# Patient Record
Sex: Male | Born: 1950
Health system: Southern US, Community
[De-identification: ages and names within clinical notes are randomized; demographics above are authoritative.]

## PROBLEM LIST (undated history)

## (undated) DIAGNOSIS — D126 Benign neoplasm of colon, unspecified: Secondary | ICD-10-CM

## (undated) DIAGNOSIS — J329 Chronic sinusitis, unspecified: Secondary | ICD-10-CM

## (undated) DIAGNOSIS — Z9889 Other specified postprocedural states: Secondary | ICD-10-CM

## (undated) DIAGNOSIS — T7840XA Allergy, unspecified, initial encounter: Secondary | ICD-10-CM

## (undated) DIAGNOSIS — M109 Gout, unspecified: Secondary | ICD-10-CM

## (undated) DIAGNOSIS — M130 Polyarthritis, unspecified: Secondary | ICD-10-CM

## (undated) DIAGNOSIS — A159 Respiratory tuberculosis unspecified: Secondary | ICD-10-CM

## (undated) DIAGNOSIS — R112 Nausea with vomiting, unspecified: Secondary | ICD-10-CM

## (undated) DIAGNOSIS — K760 Fatty (change of) liver, not elsewhere classified: Secondary | ICD-10-CM

## (undated) DIAGNOSIS — Z5189 Encounter for other specified aftercare: Secondary | ICD-10-CM

## (undated) DIAGNOSIS — I10 Essential (primary) hypertension: Secondary | ICD-10-CM

## (undated) DIAGNOSIS — K219 Gastro-esophageal reflux disease without esophagitis: Secondary | ICD-10-CM

## (undated) DIAGNOSIS — K449 Diaphragmatic hernia without obstruction or gangrene: Secondary | ICD-10-CM

## (undated) DIAGNOSIS — K648 Other hemorrhoids: Secondary | ICD-10-CM

## (undated) DIAGNOSIS — M1990 Unspecified osteoarthritis, unspecified site: Secondary | ICD-10-CM

## (undated) DIAGNOSIS — R197 Diarrhea, unspecified: Secondary | ICD-10-CM

## (undated) DIAGNOSIS — K621 Rectal polyp: Secondary | ICD-10-CM

## (undated) DIAGNOSIS — K602 Anal fissure, unspecified: Secondary | ICD-10-CM

## (undated) HISTORY — DX: Encounter for other specified aftercare: Z51.89

## (undated) HISTORY — DX: Rectal polyp: K62.1

## (undated) HISTORY — DX: Essential (primary) hypertension: I10

## (undated) HISTORY — DX: Benign neoplasm of colon, unspecified: D12.6

## (undated) HISTORY — DX: Gout, unspecified: M10.9

## (undated) HISTORY — DX: Other specified postprocedural states: Z98.890

## (undated) HISTORY — DX: Diaphragmatic hernia without obstruction or gangrene: K44.9

## (undated) HISTORY — PX: SHOULDER SURGERY: SHX246

## (undated) HISTORY — DX: Diarrhea, unspecified: R19.7

## (undated) HISTORY — PX: COLONOSCOPY: SHX174

## (undated) HISTORY — DX: Polyarthritis, unspecified: M13.0

## (undated) HISTORY — DX: Unspecified osteoarthritis, unspecified site: M19.90

## (undated) HISTORY — DX: Respiratory tuberculosis unspecified: A15.9

## (undated) HISTORY — DX: Allergy, unspecified, initial encounter: T78.40XA

## (undated) HISTORY — DX: Other specified postprocedural states: R11.2

## (undated) HISTORY — DX: Gastro-esophageal reflux disease without esophagitis: K21.9

## (undated) HISTORY — DX: Fatty (change of) liver, not elsewhere classified: K76.0

## (undated) HISTORY — PX: KNEE SURGERY: SHX244

## (undated) HISTORY — PX: POLYPECTOMY: SHX149

## (undated) HISTORY — DX: Anal fissure, unspecified: K60.2

## (undated) HISTORY — PX: CHOLECYSTECTOMY: SHX55

## (undated) HISTORY — DX: Other hemorrhoids: K64.8

## (undated) HISTORY — DX: Chronic sinusitis, unspecified: J32.9

## (undated) HISTORY — PX: TOTAL KNEE ARTHROPLASTY: SHX125

---

## 2002-05-06 ENCOUNTER — Other Ambulatory Visit: Admission: RE | Admit: 2002-05-06 | Discharge: 2002-05-06 | Payer: Self-pay | Admitting: Obstetrics and Gynecology

## 2003-08-23 ENCOUNTER — Encounter: Admission: RE | Admit: 2003-08-23 | Discharge: 2003-08-23 | Payer: Self-pay | Admitting: Family Medicine

## 2004-05-28 ENCOUNTER — Inpatient Hospital Stay (HOSPITAL_COMMUNITY): Admission: RE | Admit: 2004-05-28 | Discharge: 2004-05-30 | Payer: Self-pay | Admitting: Orthopedic Surgery

## 2004-07-25 ENCOUNTER — Ambulatory Visit (HOSPITAL_BASED_OUTPATIENT_CLINIC_OR_DEPARTMENT_OTHER): Admission: RE | Admit: 2004-07-25 | Discharge: 2004-07-25 | Payer: Self-pay | Admitting: Orthopedic Surgery

## 2005-06-17 ENCOUNTER — Ambulatory Visit (HOSPITAL_COMMUNITY): Admission: RE | Admit: 2005-06-17 | Discharge: 2005-06-17 | Payer: Self-pay | Admitting: Orthopedic Surgery

## 2005-08-06 ENCOUNTER — Encounter: Admission: RE | Admit: 2005-08-06 | Discharge: 2005-08-06 | Payer: Self-pay | Admitting: Family Medicine

## 2008-02-17 ENCOUNTER — Ambulatory Visit: Payer: Self-pay | Admitting: Gastroenterology

## 2008-02-26 ENCOUNTER — Ambulatory Visit: Payer: Self-pay | Admitting: Gastroenterology

## 2009-05-22 ENCOUNTER — Inpatient Hospital Stay (HOSPITAL_COMMUNITY): Admission: RE | Admit: 2009-05-22 | Discharge: 2009-05-24 | Payer: Self-pay | Admitting: Orthopedic Surgery

## 2009-12-07 ENCOUNTER — Ambulatory Visit (HOSPITAL_COMMUNITY): Admission: RE | Admit: 2009-12-07 | Discharge: 2009-12-07 | Payer: Self-pay | Admitting: Rheumatology

## 2010-10-17 LAB — BASIC METABOLIC PANEL
BUN: 11 mg/dL (ref 6–23)
BUN: 13 mg/dL (ref 6–23)
CO2: 28 mEq/L (ref 19–32)
CO2: 29 mEq/L (ref 19–32)
Calcium: 8.4 mg/dL (ref 8.4–10.5)
Calcium: 8.4 mg/dL (ref 8.4–10.5)
Chloride: 102 mEq/L (ref 96–112)
Chloride: 99 mEq/L (ref 96–112)
Creatinine, Ser: 0.85 mg/dL (ref 0.4–1.5)
Creatinine, Ser: 0.88 mg/dL (ref 0.4–1.5)
GFR calc Af Amer: >60 mL/min (ref >60)
GFR calc Af Amer: >60 mL/min (ref >60)
GFR calc non Af Amer: >60 mL/min (ref >60)
GFR calc non Af Amer: >60 mL/min (ref >60)
Glucose, Bld: 115 mg/dL — ABNORMAL HIGH (ref 70–99)
Glucose, Bld: 128 mg/dL — ABNORMAL HIGH (ref 70–99)
Potassium: 4 mEq/L (ref 3.5–5.1)
Potassium: 4.3 mEq/L (ref 3.5–5.1)
Sodium: 135 mEq/L (ref 135–145)
Sodium: 136 mEq/L (ref 135–145)

## 2010-10-17 LAB — CROSSMATCH
ABO/RH(D): A NEG
Antibody Screen: POSITIVE
DAT, IgG: NEGATIVE
Donor AG Type: NEGATIVE
Donor AG Type: NEGATIVE
PT AG Type: NEGATIVE

## 2010-10-17 LAB — URINALYSIS, ROUTINE W REFLEX MICROSCOPIC
Bilirubin Urine: NEGATIVE
Bilirubin Urine: NEGATIVE
Glucose, UA: NEGATIVE mg/dL
Glucose, UA: NEGATIVE mg/dL
Hgb urine dipstick: NEGATIVE
Hgb urine dipstick: NEGATIVE
Ketones, ur: NEGATIVE mg/dL
Ketones, ur: NEGATIVE mg/dL
Nitrite: NEGATIVE
Nitrite: NEGATIVE
Protein, ur: NEGATIVE mg/dL
Protein, ur: NEGATIVE mg/dL
Specific Gravity, Urine: 1.02 (ref 1.005–1.030)
Specific Gravity, Urine: 1.021 (ref 1.005–1.030)
Urobilinogen, UA: 0.2 mg/dL (ref 0.0–1.0)
Urobilinogen, UA: 0.2 mg/dL (ref 0.0–1.0)
pH: 6 (ref 5.0–8.0)
pH: 6.5 (ref 5.0–8.0)

## 2010-10-17 LAB — CBC
HCT: 32.7 % — ABNORMAL LOW (ref 39.0–52.0)
HCT: 33.5 % — ABNORMAL LOW (ref 39.0–52.0)
HCT: 44.4 % (ref 39.0–52.0)
Hemoglobin: 11.5 g/dL — ABNORMAL LOW (ref 13.0–17.0)
Hemoglobin: 11.8 g/dL — ABNORMAL LOW (ref 13.0–17.0)
Hemoglobin: 15.6 g/dL (ref 13.0–17.0)
MCHC: 35.1 g/dL (ref 30.0–36.0)
MCHC: 35.1 g/dL (ref 30.0–36.0)
MCHC: 35.3 g/dL (ref 30.0–36.0)
MCV: 91.2 fL (ref 78.0–100.0)
MCV: 91.5 fL (ref 78.0–100.0)
MCV: 91.8 fL (ref 78.0–100.0)
Platelets: 176 10*3/uL (ref 150–400)
Platelets: 207 10*3/uL (ref 150–400)
Platelets: 251 10*3/uL (ref 150–400)
RBC: 3.58 MIL/uL — ABNORMAL LOW (ref 4.22–5.81)
RBC: 3.65 MIL/uL — ABNORMAL LOW (ref 4.22–5.81)
RBC: 4.87 MIL/uL (ref 4.22–5.81)
RDW: 13.5 % (ref 11.5–15.5)
RDW: 13.6 % (ref 11.5–15.5)
RDW: 13.8 % (ref 11.5–15.5)
WBC: 11.2 10*3/uL — ABNORMAL HIGH (ref 4.0–10.5)
WBC: 15.7 10*3/uL — ABNORMAL HIGH (ref 4.0–10.5)
WBC: 6.8 10*3/uL (ref 4.0–10.5)

## 2010-10-17 LAB — URINE CULTURE
Colony Count: NO GROWTH
Colony Count: NO GROWTH
Culture: NO GROWTH
Culture: NO GROWTH

## 2010-10-17 LAB — COMPREHENSIVE METABOLIC PANEL
ALT: 45 U/L (ref 0–53)
AST: 33 U/L (ref 0–37)
Albumin: 4.2 g/dL (ref 3.5–5.2)
Alkaline Phosphatase: 62 U/L (ref 39–117)
BUN: 13 mg/dL (ref 6–23)
CO2: 31 mEq/L (ref 19–32)
Calcium: 9.6 mg/dL (ref 8.4–10.5)
Chloride: 102 mEq/L (ref 96–112)
Creatinine, Ser: 0.86 mg/dL (ref 0.4–1.5)
GFR calc Af Amer: >60 mL/min (ref >60)
GFR calc non Af Amer: >60 mL/min (ref >60)
Glucose, Bld: 88 mg/dL (ref 70–99)
Potassium: 4.8 mEq/L (ref 3.5–5.1)
Sodium: 140 mEq/L (ref 135–145)
Total Bilirubin: 0.5 mg/dL (ref 0.3–1.2)
Total Protein: 7.7 g/dL (ref 6.0–8.3)

## 2010-10-17 LAB — TYPE AND SCREEN
ABO/RH(D): A NEG
Antibody Screen: POSITIVE

## 2010-10-17 LAB — DIFFERENTIAL
Basophils Absolute: 0 10*3/uL (ref 0.0–0.1)
Basophils Relative: 0 % (ref 0–1)
Eosinophils Absolute: 0.2 10*3/uL (ref 0.0–0.7)
Eosinophils Relative: 3 % (ref 0–5)
Lymphocytes Relative: 29 % (ref 12–46)
Lymphs Abs: 2 10*3/uL (ref 0.7–4.0)
Monocytes Absolute: 0.6 10*3/uL (ref 0.1–1.0)
Monocytes Relative: 9 % (ref 3–12)
Neutro Abs: 3.9 10*3/uL (ref 1.7–7.7)
Neutrophils Relative %: 58 % (ref 43–77)

## 2010-10-17 LAB — APTT: aPTT: 32 seconds (ref 24–37)

## 2010-10-17 LAB — PROTIME-INR
INR: 1.11 (ref 0.00–1.49)
Prothrombin Time: 14.2 seconds (ref 11.6–15.2)
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
## 2010-11-30 NOTE — Op Note (Signed)
NAMEJEMARCUS, DOUGAL               ACCOUNT NO.:  [id number]   MEDICAL RECORD NO.:  [id number]          PATIENT TYPE:  INP   LOCATION:  5004                         FACILITY:  [facility]   PHYSICIAN:  Estill Bamberg. Ronnie Derby, M.D. DATE OF BIRTH:  09/29/1950   DATE OF PROCEDURE:  05/28/2004  DATE OF DISCHARGE:                                 OPERATIVE REPORT   PREOPERATIVE DIAGNOSIS:  Left knee osteoarthritis.   POSTOPERATIVE DIAGNOSIS:  Left knee osteoarthritis.   OPERATION PERFORMED:  Left total knee arthroplasty.   SURGEON:  Estill Bamberg. Ronnie Derby, M.D.   ASSISTANT:  Thomasenia Bottoms, P.A.-C.   ANESTHESIA:  General.   INDICATIONS FOR PROCEDURE:  The patient is a 60 year old white male with  failure of conservative measures for osteoarthritis of the knee.  Informed  consent was obtained.   DESCRIPTION OF PROCEDURE:  The patient was laid supine and administered  general anesthesia.  The left lower extremity was prepped and draped in the  usual sterile fashion.  A #10 blade was used to make a midline incision 10  cm in length.  A new #10 blade was used to make a median parapatellar  arthrotomy and to elevate the deep medial collateral ligament off the medial  crest of the tibia.  I also performed a synovectomy in the patellofemoral  pouch.  I everted the patella and it measured 26 mm thickness.  I used a 32  mm reamer and reamed down to 16 mm.  I then used a template and actually  accepted a 38 mm patella.  At this point, I drilled the three lug holes in  and with the trial in place, it also measured 26 mm thick.  I removed the  trial component, left the knee cap everted and went into flexion.  I cut the  ACL and PCL and subluxed anteriorly.  I then used the extramedullary  alignment system to set up the tibial cut removing 2 mm of bone off of the  medial tibial plateau.  I made the cut with the sagittal saw and removed the  extramedullary device.  At this point I made an extramedullary drill hole  in  the femur and set the intramedullary guide set on 6 degree Valgus cut.  I  then placed the distal femoral cutting block in place, pinned it into place  and made the distal femoral cut with a sagittal saw.  I then removed the  cutting block.  I drew out the epicondylar axis, posterior condylar angle  measured 3 degrees.  I sized to a size F and pinned to the three degree  external rotation holes.  I pinned a 4 in 1cutting block into place and made  the anterior, posterior and chamfer cuts.  I removed the cutting block and  placed a 12 spacer block in the knee, had good flexion and extension gap  balance that was perfect with a 14.  I did some medial releasing at this  point to get a good flexion extension gap balance.  I then placed a scoop  retractor behind the distal femur and prepared  the femur with a size F  finishing block, cutting the notch and drilling lug holes.  I then prepared  the tibia with a size 6 tibial tray drill and keel.  At this point I trialed  with a size 6 tibia, size F femur, size 14 insert and a 38 mm patella, had  good flexion extension gap balance, good patellar tracking.  I removed the  trial components and copiously irrigated.  I then removed the trial  components and copiously irrigated. At this point I cemented in a size 6  tibial first, size F femur second, snapped in a real 14 polyethylene after  removing excess cement.  I then cemented in the 38 patella with the knee in  extension.  After the cement was hardened, I let the tourniquet down.  I  cauterized the bleeding vessels.  At this point I left the Hemovac deep to  the arthrotomy coming out superolaterally.  I then closed the arthrotomy  with interrupted #1 Vicryl suture, the deep soft tissues with interrupted 0  Vicryl sutures and a subcuticular 2-0 Vicryl stitch.  I then closed with  skin staples and dressed with Xeroform dressing sponges and sterile Webril  and Ace wrap.   COMPLICATIONS:   None.   DRAINS:  One Hemovac.   ESTIMATED BLOOD LOSS:  300 mL.       SDL/MEDQ  D:  05/28/2004  T:  05/29/2004  Job:  [id number]

## 2010-11-30 NOTE — Op Note (Signed)
NAME:  RYE, DECOSTE               ACCOUNT NO.:  [id number]   MEDICAL RECORD NO.:  [id number]          PATIENT TYPE:  AMB   LOCATION:                               FACILITY:  [facility]   PHYSICIAN:  Estill Bamberg. Ronnie Derby, M.D.      DATE OF BIRTH:   DATE OF PROCEDURE:  07/25/2004  DATE OF DISCHARGE:                                 OPERATIVE REPORT   PREOPERATIVE DIAGNOSIS:  Arthrofibrosis of the left knee, status post total  knee arthroplasty.   POSTOPERATIVE DIAGNOSIS:  Arthrofibrosis of the left knee, status post total  knee arthroplasty.   PROCEDURE:  Left knee manipulation.   INDICATIONS FOR PROCEDURE:  The patient is a 60 year old 8 weeks status post  TKA with some stiffness.  Informed consent was obtained.   DESCRIPTION OF PROCEDURE:  The patient was laid supine and administered  general mask anesthesia.  His leg was then manipulated in standard fashion  which went from 0 to 85 degrees up to 0 to 115 degrees.  At this point, an 8  mL Marcaine/2 mL Depo-Medrol mixture was injected and the patient was  awakened and taken to the recovery room in stable condition.                                               ______________________________  Estill Bamberg Ronnie Derby, M.D.    SDL/MEDQ  D:  08/06/2004  T:  08/06/2004  Job:  [id number]

## 2010-11-30 NOTE — Op Note (Signed)
NAME:  Christopher Smith, Christopher Smith               ACCOUNT NO.:  [id number]   MEDICAL RECORD NO.:  [id number]          PATIENT TYPE:  AMB   LOCATION:  SDS                          FACILITY:  [facility]   PHYSICIAN:  Kathalene Frames. Mayer Camel, M.D.   DATE OF BIRTH:  1951-06-12   DATE OF PROCEDURE:  DATE OF DISCHARGE:  06/17/2005                                 OPERATIVE REPORT   PREOPERATIVE DIAGNOSIS:  Left shoulder retracted rotator cuff tear.   POSTOPERATIVE DIAGNOSIS:  Left shoulder retracted rotator cuff tear.   PROCEDURE:  1.  Left shoulder arthroscopic anterior-inferior acromioplasty, co-planing      of the distal clavicle, and debridement of a fairly massive rotator cuff      tear.  2.  Greater tuberoplasty.   ANESTHETIC:  General endotracheal.   ESTIMATED BLOOD LOSS:  Minimal.   FLUID REPLACEMENT:  One liter of crystalloids.   DRAINS PLACED:  None.   TOURNIQUET TIME:  None.   INDICATIONS FOR PROCEDURE:  A 60 year old gentleman with an MRI proven, left  shoulder rotator cuff tear retracted greater than 3 cm with impingement-type  pain who desires elective arthroscopic evaluation and treatment with  possible repair if the tissue is amenable to a repair.  This procedure is  being done at the main hospital because of a history of sleep apnea that  made him a less than desirable candidate for [facility]. Risks  and benefits of surgery discussed in advance, and all questions answered.   DESCRIPTION OF PROCEDURE:  The patient was identified by armband, taken to  the operating room, at [facility]. Appropriate anesthetic monitors  were attached; and general endotracheal anesthesia induced with the patient  in the supine position.  He was then placed into the beach chair position  and the left upper extremity prepped and draped in the usual sterile fashion  from the wrist to the hemithorax. Using a #11 blade standard portals were  then made 1.5 cm anterior to the [facility] joint, lateral to  the junction of the  middle and posterior thirds of the acromion, and posterior to the  posterolateral corner of the acromion process. The arthroscope was placed in  the lateral portal, the inflow through the anterior portal, and a 4.2 great  white sucker shaver through the posterior portal.   Diagnostic arthroscopy did reveal a massive rotator cuff tear with friable  tissue that, after debridement back to stable margin, did not look like to  be repairable. A supplemental anterolateral portal was then made allowing  introduction of an arthroscopic grasper; and we attempted to reduce the edge  of the supraspinatus tendon back to its insertion point on the greater  tuberosity, but could not get it within 2 cm of the insertion point.  Therefore, it was felt that this was a massive unrepairable tear in a 60-  year-old gentleman.   At this point we set about debriding any loose tissue off the greater  tuberosity, smoothing the greater tuberosity with a 4.5 hooded vortex bur.  We then resected the anterior-inferior edge of the acromion with a  4.5  hooded vortex bur creating a type 1 acromion, and also removed a small spur  from the distal clavicle. At this point then, the shoulder was irrigated out  with normal saline solution. The arthroscopic instruments removed and a  dressing of Xeroform, 4 x 4 dressing, sponges, paper tape, and a sling  applied. The patient was then laid supine, awakened, and taken to the  recovery room without difficulty.      Kathalene Frames. Mayer Camel, M.D.  Electronically Signed     FJR/MEDQ  D:  07/01/2005  T:  07/02/2005  Job:  [id number]

## 2010-11-30 NOTE — Op Note (Signed)
NAME:  Christopher Smith, Christopher Smith               ACCOUNT NO.:  [id number]   MEDICAL RECORD NO.:  [id number]          PATIENT TYPE:  AMB   LOCATION:                               FACILITY:  [facility]   PHYSICIAN:  Estill Bamberg. Ronnie Derby, M.D. DATE OF BIRTH:  October 04, 1950   DATE OF PROCEDURE:  07/25/2004  DATE OF DISCHARGE:                                 OPERATIVE REPORT   PREOPERATIVE DIAGNOSES:  Arthrofibrosis of the left knee, status post below-  knee amputation.   POSTOPERATIVE DIAGNOSES:  Arthrofibrosis of the left knee, status post below-  knee amputation.   PROCEDURES:  Left knee manipulation.   INDICATIONS FOR PROCEDURE:  The patient is a 60 year old status post a BKA  the week prior, now with stiffness after some trauma to the knee. Informed  consent was obtained.   DESCRIPTION OF PROCEDURE:  The patient was placed under general anesthesia  via mask and the knee was manipulated in the standard fashion. We started  with 0-90 degrees and ended with 1-122 degrees. Depo-Medrol 2 mL and 8 mL of  lidocaine were placed in the knee.   DISPOSITION:  Afterwards, the patient was taken to the recovery room in  stable condition.   COMPLICATIONS:  None.   DRAINS:  None.   ESTIMATED BLOOD LOSS:  None.       ___________________________________________  Estill Bamberg. Ronnie Derby, M.D.    SDL/MEDQ  D:  08/13/2004  T:  08/13/2004  Job:  [id number]

## 2010-12-05 ENCOUNTER — Encounter: Payer: Self-pay | Admitting: Gastroenterology

## 2010-12-07 ENCOUNTER — Ambulatory Visit: Payer: Self-pay | Admitting: Gastroenterology

## 2011-01-11 ENCOUNTER — Ambulatory Visit (INDEPENDENT_AMBULATORY_CARE_PROVIDER_SITE_OTHER): Payer: 59 | Admitting: Gastroenterology

## 2011-01-11 ENCOUNTER — Encounter: Payer: Self-pay | Admitting: Gastroenterology

## 2011-01-11 ENCOUNTER — Other Ambulatory Visit (INDEPENDENT_AMBULATORY_CARE_PROVIDER_SITE_OTHER): Payer: 59

## 2011-01-11 DIAGNOSIS — R197 Diarrhea, unspecified: Secondary | ICD-10-CM

## 2011-01-11 DIAGNOSIS — Z713 Dietary counseling and surveillance: Secondary | ICD-10-CM

## 2011-01-11 DIAGNOSIS — K639 Disease of intestine, unspecified: Secondary | ICD-10-CM | POA: Insufficient documentation

## 2011-01-11 DIAGNOSIS — IMO0001 Reserved for inherently not codable concepts without codable children: Secondary | ICD-10-CM | POA: Insufficient documentation

## 2011-01-11 DIAGNOSIS — R195 Other fecal abnormalities: Secondary | ICD-10-CM

## 2011-01-11 LAB — HEPATIC FUNCTION PANEL: Total Bilirubin: 0.7 mg/dL (ref 0.3–1.2)

## 2011-01-11 LAB — IBC PANEL
Iron: 66 ug/dL (ref 42–165)
Saturation Ratios: 17.4 % — ABNORMAL LOW (ref 20.0–50.0)
Transferrin: 270.7 mg/dL (ref 212.0–360.0)

## 2011-01-11 LAB — BASIC METABOLIC PANEL
BUN: 15 mg/dL (ref 6–23)
Calcium: 9 mg/dL (ref 8.4–10.5)
Creatinine, Ser: 0.8 mg/dL (ref 0.4–1.5)
GFR: 109.21 mL/min (ref >60.00)
Glucose, Bld: 98 mg/dL (ref 70–99)
Potassium: 5 mEq/L (ref 3.5–5.1)

## 2011-01-11 LAB — CBC WITH DIFFERENTIAL/PLATELET
Basophils Relative: 0.3 % (ref 0.0–3.0)
Eosinophils Relative: 1.6 % (ref 0.0–5.0)
Hemoglobin: 15.2 g/dL (ref 13.0–17.0)
Lymphocytes Relative: 21.2 % (ref 12.0–46.0)
Monocytes Relative: 10.1 % (ref 3.0–12.0)
Neutro Abs: 5.6 10*3/uL (ref 1.4–7.7)
Neutrophils Relative %: 66.8 % (ref 43.0–77.0)
RBC: 4.94 Mil/uL (ref 4.22–5.81)
WBC: 8.4 10*3/uL (ref 4.5–10.5)

## 2011-01-11 LAB — FOLATE: Folate: 14.7 ng/mL (ref >5.9)

## 2011-01-11 LAB — VITAMIN B12: Vitamin B-12: 265 pg/mL (ref 211–911)

## 2011-01-11 MED ORDER — DIPHENOXYLATE-ATROPINE 2.5-0.025 MG PO TABS: 1.0000 | ORAL_TABLET | Freq: Four times a day (QID) | ORAL | Status: AC | PRN

## 2011-01-11 MED ORDER — MESALAMINE 1.2 G PO TBEC: DELAYED_RELEASE_TABLET | ORAL | Status: DC

## 2011-01-11 MED ORDER — PEG-KCL-NACL-NASULF-NA ASC-C 100 G PO SOLR: 1.0000 | Freq: Once | ORAL | Status: DC

## 2011-01-11 NOTE — Patient Instructions (Addendum)
Stop your sulfasalzine. Take Lialda two tablets by mouth twice a day, samples given and rx sent to your pharmacy. Please go to the basement today for your labs.  Your procedure has been scheduled for 01/24/2011, please follow the seperate instructions.  Your prescription(s) have been sent to you pharmacy.     Low-Fiber and Residue Restricted Diets A low-fiber diet restricts foods that contain carbohydrates that are not digested in the small intestine. A diet containing about 10 grams of fiber is considered low-fiber. The diet needs to be individualized to suit patient tolerances and preferences and to avoid unnecessary restrictions. Generally, the foods emphasized in a low-fiber diet have no skins or seeds. They may have been processed to remove bran, germ, or husks. Cooking may not necessarily eliminate the fiber. Cooking may, in fact, enable a greater quantity of fiber to be consumed in a lesser volume. Legumes and nuts are also restricted. The term low-residue has also been used to describe low-fiber diets, although the two are not the same. Residue refers to any substance that adds to bowel (colonic) contents, such as sloughed cells and intestinal germs (bacteria) in addition to fiber. Residue-containing foods, prunes and prune juice, milk, and connective tissue from meats may also need to be eliminated. It is important to eliminate these foods during sudden (acute) attacks of inflammatory bowel disease, when there is a partial obstruction due to another reason, or when minimal fecal output is desired. When problems are in remission, a more normal diet may be used. PURPOSE  Prevent blockage of a partially obstructed or narrowed gastrointestinal tract.   Reduce stool weight and volume.   Slow the movement of waste.  WHEN IS THIS DIET USED?  Acute phase of Crohn's disease, ulcerative colitis, regional enteritis, or diverticulitis.   Narrowing (stenosis) of intestinal or esophageal tubes  (lumina).   Transitional diet following surgery, injury (trauma), or illness.  ADEQUACY This diet is nutritionally adequate based on individual food choices according to the Recommended Dietary Allowances of the [employer]. SPECIAL NOTES In severe cases, it is recommended that residue containing foods, prunes and prune juice, milk, and connective tissue from meats be eliminated. Check labels, especially on foods from the starch list. Often, dietary fiber content is listed with the nutrition information. Since products are continually introduced or removed from the grocery shelf, this list may not be complete. FOOD GROUP ALLOWED/RECOMMENDED AVOID/USE SPARINGLY  STARCHES 6 servings or more daily    Breads White, [place], and pita breads, plain rolls, buns, or sweet rolls, doughnuts, waffles, pancakes, bagels. Plain muffins, sweet breads, biscuits, matzoth. Flour. Bread, rolls, or crackers made with whole-wheat, multigrains, rye, bran seeds, nuts, or coconut. Corn tortillas, table-shells.  Crackers Soda, saltine, or graham crackers. Pretzels, rusks, melba toast, zwieback. See above. Corn chips, tortilla chips.  Cereals Cooked cereals: cornmeal, farina, cream cereals. Dry cereals: refined corn, wheat, rice, and oat cereals (check label). Cereals containing whole-grains, multigrains, bran, coconut, nuts, or raisins. Cooked or dry oatmeal. Coarse wheat cereals, granola. Cereals advertised as "high fiber."  Potatoes/Pasta/Rice Potatoes prepared any way without skins, refined macaroni, spaghetti, noodles, refined rice. Potato skins. Whole-grain pasta, wild or brown rice. Popcorn.  VEGETABLES 2 to 3 servings or more daily Strained tomato and vegetable juices. Fresh: tender lettuce, cucumber, cabbage, spinach, bean sprouts. Cooked, canned: asparagus, bean sprouts, cut green or wax beans, cauliflower, pumpkin, beets, mushrooms, olives, spinach, yellow squash, tomato, tomato sauce (no seeds),  zucchini (peeled), turnips. Canned sweet potatoes. Small amounts  of celery, onion, radish, and green pepper may be used. Keep servings limited to 1/2 cup. Fresh, cooked, or canned: artichokes, baked beans, beet greens, broccoli, Brussels sprouts, French-style green beans, corn, kale, legumes, peas, sweet potatoes. Cooked: green or red cabbage, spinach. Avoid large servings of any vegetables.  FRUIT 2 to 3 servings or more daily All fruit juices except prune juice. Cooked or canned: apricots applesauce, cantaloupe, cherries, grapefruit, grapes, kiwi, mandarin oranges, peaches, pears, fruit cocktail, pineapple, plums, watermelon. Fresh: banana, grapes, cantaloupe, avocado, cherries, pineapple, grapefruit, kiwi, nectarines, peaches, oranges, blueberries, plums. Keep servings limited to 1/2 cup or 1 piece. Fresh: apple with or without skin, apricots, mango, pears, raspberries, strawberries. Prune juice, stewed or dried prunes. Dried fruits, raisins, dates. Avoid large servings of all fresh fruits.  MEAT AND MEAT SUBSTITUTES 2 servings or more (4 to 6 total daily) Ground or well-cooked tender beef, ham, veal, lamb, pork, or poultry. Eggs, plain cheese. Fish, oysters, shrimp, lobster, other seafood. Liver, organ meats. Tough, fibrous meats with gristle. Peanut butter, smooth or chunky. Cheese with seeds, nuts, or other foods not allowed. Nuts, seeds, legumes, dried peas, beans, lentils.  MILK 2 cups or equivalent daily All milk products except those not allowed. Milk and milk product consumption should be minimal when low residue is desired. Yogurt that contains nuts or seeds.  SOUPS AND COMBINATION FOODS Bouillon, broth, or cream soups made from allowed foods. Any strained soup. Casseroles or mixed dishes made with allowed foods. Soups made from vegetables that are not allowed or that contain other foods not allowed.  DESSERTS AND SWEETS In moderation Plain cakes and cookies, pie made with allowed fruit,  pudding, custard, cream pie. Gelatin, fruit, ice, sherbet, frozen ice pops. Ice cream, ice milk without nuts. Plain hard candy, honey, jelly, molasses, syrup, sugar, chocolate syrup, gumdrops, marshmallows. Desserts, cookies, or candies that contain nuts, peanut butter, or dried fruits. Jams, preserves with seeds, marmalade.  FATS AND OILS In moderation Margarine, butter, cream, mayonnaise, salad oils, plain salad dressings made from allowed foods. Plain gravy, crisp bacon without rind. Seeds, nuts, olives. Avocados.  BEVERAGES All, except those listed to avoid. Fruit juices with high pulp, prune juice.  CONDIMENTS/ MISCELLANEOUS Ketchup, mustard, horseradish, vinegar, cream sauce, cheese sauce, cocoa powder. Spices in moderation: allspice, basil, bay leaves, celery powder or leaves, cinnamon, cumin powder, curry powder, ginger, mace, marjoram, onion or garlic powder, oregano, paprika, parsley flakes, ground pepper, rosemary, sage, savory, tarragon, thyme, turmeric. Coconut, pickles.  A serving is equal to: 1/2 cup for fruits, vegetables, and cooked cereals or 1 piece for foods such as a piece of bread, 1 orange, or 1 apple. For dry cereals and crackers, use serving sizes listed on the label. SAMPLE MEAL PLAN The following menu is provided as a sample. Your daily menu plans will vary. Be sure to include a minimum of the following each day in order to provide essential nutrients for the adult: Starch/Bread/Cereal Group Fruit/Vegetable Group Meat/Meat Substitute Group Milk/Milk Substitute Group 6 servings 5 servings 2 servings 2 servings  Combination foods may count as full or partial servings from various food groups. Fats, desserts, and sweets may be added to the meal plan after the requirements for essential nutrients are met. SAMPLE MENU Breakfast Lunch Supper  1/2 cup orange juice 1/2 cup chicken noodle soup 3 ounces baked chicken  1 boiled egg 2 to 3 ounces sliced roast beef 1/2 cup  scalloped potatoes  1 slice white toast 2 slices seedless rye  bread 1/2 cup cooked beets  Margarine Mayonnaise White dinner roll  3/4 cup cornflakes 1/2 cup tomato juice Margarine  1 cup milk 1 small banana 1/2 cup canned peaches  Beverage Beverage Beverage  Document Released: 12/21/2001 Document Re-Released: 09/25/2009 [facility] Patient Information 2011 [place].

## 2011-01-11 NOTE — Progress Notes (Signed)
History of Present Illness:  This is a very pleasant 60 year old Caucasian male who has completed a 8 month course of INH  because of a positive PPD which was placed per anticipation of immunosuppressive therapy because of a diffuse inflammatory arthropathy. 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 Because of his arthropathy, he was placed on Azulfidine, 1 g a day by mouth several months ago. He presents with 6 months of 10-12 bowel movements a day without abdominal cramping, gas, bloating, but he does have occasional bright red blood per rectum. He has lost 18-20 pounds in weight, and his symptoms also suggest possible malabsorption. There is no history of prior pancreatitis, hepatitis, or alcohol abuse. He quit smoking 25 years ago. He also has has severe nocturnal diarrhea, but denies fever, chills, or skin rashes. His arthritis is mostly in his hands and large joints, and he relates he has had multiple lab tests done all of which have been unremarkable, he has had rheumatology referral. His diarrhea has not been responsive to Questran trial for a history of previous cholecystectomy, Imodium,or dietary restrictions. Family history is noncontributory. He had a negative colonoscopy except for diverticulosis in our office several years ago. Patient has had bilateral knee replacements by Dr. Maureen Ralphs.  I have reviewed this patient's present history, medical and surgical past history, allergies and medications.     ROS: The remainder of the 10 point ROS is negative... he has mild fatigue but is able to work full time at his job. He denies foreign travel or known infectious disease exposure. Does not abuse NSAIDs or alcohol or cigarettes. He does complain of crampy pain in his left upper abdominal area and his legs and extremities during the day and night. Unknown history of electrolyte imbalance. However, most of his records are not available for  review.  Past Medical History  Diagnosis Date  . Arthritis   . Tuberculosis     Hx of   . GERD (gastroesophageal reflux disease)   . Diarrhea    Past Surgical History  Procedure Date  . Cholecystectomy   . Knee surgery     Bilateral   . Shoulder surgery     Bilateral     reports that he has quit smoking. He has never used smokeless tobacco. He reports that he drinks alcohol. He reports that he does not use illicit drugs. family history is negative for Colon cancer. No Known Allergies      Physical Exam: General well developed well nourished patient in no acute distress, appearing their stated age Eyes PERRLA, no icterus, fundoscopic exam per opthamologist Skin no lesions noted Neck supple, no adenopathy, no thyroid enlargement, no tenderness Chest clear to percussion and auscultation Heart no significant murmurs, gallops or rubs noted Abdomen no hepatosplenomegaly masses or tenderness, BS normal.  Rectal inspection normal no fissures, or fistulae noted.  No masses or tenderness on digital exam. Stool guaiac positive. The stool is loose and blood-tinged.. Extremities no acute joint lesions, edema, phlebitis or evidence of cellulitis. Lateral knee scar is noted. There is trace to +1 edema in his lower tremors. Neurologic patient oriented x 3, cranial nerves intact, no focal neurologic deficits noted. Psychological mental status normal and normal affect.  Assessment and plan: Bloody diarrhea of a rather severe nature suggestive of ulcerative colitis. Other considerations be C. difficile colitis although INH is not a common cause of this condition. I suspect is inflammatory arthritis is associated with his  inflammatory bowel disease. He does not take his Azulfidine at required doses. I changed him to Lialda 2.4 g twice today, when necessary Lomotil prn every 6-8 hours, low fiber diet, and scheduled followup labs includeding a sed rate and CRP, and multiple stool exams including C.  difficile toxin, culture, O&P exam, and fat smear. We will try to locate his rheumatologist for better record review. Followup colonoscopy also scheduled ASAP.   Please copy her primary care physician, referring physician, and pertinent subspecialists. We'll request records from his primary care physician and from his rheumatologist. Encounter Diagnoses  Name Primary?  . Heme positive stool   . Diarrhea

## 2011-01-14 ENCOUNTER — Other Ambulatory Visit: Payer: 59

## 2011-01-14 DIAGNOSIS — R197 Diarrhea, unspecified: Secondary | ICD-10-CM

## 2011-01-14 DIAGNOSIS — R195 Other fecal abnormalities: Secondary | ICD-10-CM

## 2011-01-15 LAB — FECAL FAT QUALITATIVE: NEUTRAL FAT: NORMAL

## 2011-01-16 LAB — CLOSTRIDIUM DIFFICILE BY PCR: Toxigenic C. Difficile by PCR: NOT DETECTED

## 2011-01-18 LAB — STOOL CULTURE

## 2011-01-24 ENCOUNTER — Ambulatory Visit (AMBULATORY_SURGERY_CENTER): Payer: 59 | Admitting: Gastroenterology

## 2011-01-24 ENCOUNTER — Encounter: Payer: Self-pay | Admitting: Gastroenterology

## 2011-01-24 DIAGNOSIS — K5190 Ulcerative colitis, unspecified, without complications: Secondary | ICD-10-CM

## 2011-01-24 DIAGNOSIS — K51911 Ulcerative colitis, unspecified with rectal bleeding: Secondary | ICD-10-CM

## 2011-01-24 DIAGNOSIS — R197 Diarrhea, unspecified: Secondary | ICD-10-CM

## 2011-01-24 DIAGNOSIS — R195 Other fecal abnormalities: Secondary | ICD-10-CM

## 2011-01-24 MED ORDER — SODIUM CHLORIDE 0.9 % IV SOLN: 500.0000 mL | INTRAVENOUS | Status: DC

## 2011-01-24 NOTE — Patient Instructions (Signed)
Please read the handouts given to you by your recovery room nurse.   You do have colitis and need to eat a low fiber diet until you see Dr. Sharlett Iles again on August 8th.   Please, take all of your routine medications today.  Do not skip a dose of your lialda.  This is very important to your recovery.   Call us if you have any questions or concerns at [phone].  Thank-you.

## 2011-01-25 ENCOUNTER — Telehealth: Payer: Self-pay | Admitting: *Deleted

## 2011-01-25 NOTE — Telephone Encounter (Signed)

## 2011-01-29 ENCOUNTER — Encounter: Payer: Self-pay | Admitting: Gastroenterology

## 2011-01-29 ENCOUNTER — Other Ambulatory Visit: Payer: Self-pay | Admitting: Gastroenterology

## 2011-02-11 ENCOUNTER — Encounter: Payer: Self-pay | Admitting: *Deleted

## 2011-02-15 ENCOUNTER — Encounter: Payer: Self-pay | Admitting: Gastroenterology

## 2011-02-15 ENCOUNTER — Other Ambulatory Visit (INDEPENDENT_AMBULATORY_CARE_PROVIDER_SITE_OTHER): Payer: 59

## 2011-02-15 ENCOUNTER — Ambulatory Visit (INDEPENDENT_AMBULATORY_CARE_PROVIDER_SITE_OTHER): Payer: 59 | Admitting: Gastroenterology

## 2011-02-15 DIAGNOSIS — K5190 Ulcerative colitis, unspecified, without complications: Secondary | ICD-10-CM

## 2011-02-15 LAB — HIGH SENSITIVITY CRP: CRP, High Sensitivity: 11.3 mg/L — ABNORMAL HIGH (ref 0.000–5.000)

## 2011-02-15 MED ORDER — MESALAMINE 4 G RE ENEM: 4.0000 g | ENEMA | Freq: Every day | RECTAL | Status: DC

## 2011-02-15 MED ORDER — PREDNISONE 10 MG PO TABS: ORAL_TABLET | ORAL | Status: DC

## 2011-02-15 NOTE — Patient Instructions (Signed)
Please go to the basement today for your labs.  Your prescription(s) have been sent to you pharmacy.  Use Lomotil only as needed. Use Rowasa supp at night, and take Prednisone 70m per day.  Make an appt to come back and see Dr PSharlett Ilesin 2 weeks and the day prior come to the basement for a repeat CRP.

## 2011-02-15 NOTE — Progress Notes (Signed)
This is a very pleasant 60 year old Caucasian male with severe diarrhea refractory currently to oral mucosal is slight therapy 4.8 g daily. He also is using Lomotil 3-4 times a day and continues with 6-7 loose bowel movements a day with occasional pain. He denies abdominal pain or other gastrointestinal or systemic complaints. Recent colonoscopy confirmed what appeared to be ulcerative colitis confirmed by biopsies. Stool exams otherwise been unremarkable. He has had an elevated CRP. Testing for C. difficile was also negative.  Current Medications, Allergies, Past Medical History, Past Surgical History, Family History and Social History were reviewed in [employer] record.  Pertinent Review of Systems Negative   Assessment and plan: Moderately severe inflammatory bowel disease unresponsive to oral amiodarone salicylates after 6 weeks of therapy. I have added Rowasa 4 g enemas at bedtime and will start prednisone 40 mg a day with office followup in 2 weeks' time. CRP ordered today and in 2 weeks' time. I have asked him to decrease his Lomotil to when necessary usage of possible and to liberalize his diet as tolerated.     Encounter Diagnosis  Name Primary?  . Ulcerative colitis Yes

## 2011-02-28 ENCOUNTER — Other Ambulatory Visit (INDEPENDENT_AMBULATORY_CARE_PROVIDER_SITE_OTHER): Payer: 59

## 2011-02-28 DIAGNOSIS — K5190 Ulcerative colitis, unspecified, without complications: Secondary | ICD-10-CM

## 2011-02-28 LAB — HIGH SENSITIVITY CRP: CRP, High Sensitivity: 7.71 mg/L — ABNORMAL HIGH (ref 0.000–5.000)

## 2011-03-01 ENCOUNTER — Encounter: Payer: Self-pay | Admitting: Gastroenterology

## 2011-03-01 ENCOUNTER — Other Ambulatory Visit (INDEPENDENT_AMBULATORY_CARE_PROVIDER_SITE_OTHER): Payer: 59

## 2011-03-01 ENCOUNTER — Ambulatory Visit (INDEPENDENT_AMBULATORY_CARE_PROVIDER_SITE_OTHER): Payer: 59 | Admitting: Gastroenterology

## 2011-03-01 VITALS — BP 110/80 | HR 78 | Ht 71.5 in | Wt 210.0 lb

## 2011-03-01 DIAGNOSIS — R252 Cramp and spasm: Secondary | ICD-10-CM

## 2011-03-01 DIAGNOSIS — K5190 Ulcerative colitis, unspecified, without complications: Secondary | ICD-10-CM | POA: Insufficient documentation

## 2011-03-01 DIAGNOSIS — R197 Diarrhea, unspecified: Secondary | ICD-10-CM

## 2011-03-01 LAB — ELECTROLYTE PANEL: Sodium: 139 mEq/L (ref 135–145)

## 2011-03-01 MED ORDER — MESALAMINE 1000 MG RE SUPP: 1000.0000 mg | Freq: Every day | RECTAL | Status: DC

## 2011-03-01 NOTE — Patient Instructions (Signed)
Stop the Rowasa. Start Canasa at night, rx sent to your pharmacy. You can take Imoddium AD twice a day as needed.  Make a return office visit for 2 weeks and come the day before for a repeat CRP. Please go to the basement today for your labs.

## 2011-03-01 NOTE — Progress Notes (Signed)
This is a 60 year old Caucasian male with left-sided ulcerative colitis refractory to standard aminosalicylate therapy. We recently added Rowasa enemas which he could not tolerate. I started prednisone 40 mg a day 2 weeks ago and he feels much better symptomatically but continues with frequent small volume nonbloody diarrhea. However, he is working daily and denies systemic complaints. Appetite is good and his weight is stable. Continues on Lialda 2.4 g twice a day.  Current Medications, Allergies, Past Medical History, Past Surgical History, Family History and Social History were reviewed in [employer] record.  Pertinent Review of Systems Negative.Marland Kitchen specifically no cough, sputum production, cardiovascular symptoms, fever, chills, skin rashes, joint pains, oral stomatitis. Review of systems 10 point negative. He does complain of leg spasms at night but no claudications.Marland Kitchen there is no history of any specific food intolerances, upper GI or hepatobiliary problems.   Physical Exam: Healthy appearing in no acute distress appearing his stated age. I cannot appreciate stigmata of chronic liver disease. His chest is clear to percussion and auscultation. His irregular rhythm without murmurs gallops or rubs. There is no organomegaly, abdominal masses or tenderness. Bowel sounds are normal. Peripheral extremities are unremarkable mental status is normal. Rectal exam is deferred.    Assessment and Plan: Left-sided ulcerative colitis beginning to respond to high-dose corticosteroid therapy. I will try and also 1 g suppositories at bedtime in place of Rowasa enemas. CRP has decreased from 15-6, and I suspect his symptomatology will begin to improve shortly. I have advised him to use Imodium AD twice a day regularly as tolerated. He is to continue prednisone 40 mg a day with office followup in 2 weeks' time, repeat CRP exam before his office visit. Also today because of his complaints of leg  cramps we will order electrolytes profile. Encounter Diagnosis  Name Primary?  . UC (ulcerative colitis) Yes

## 2011-03-11 ENCOUNTER — Other Ambulatory Visit: Payer: Self-pay | Admitting: Gastroenterology

## 2011-03-21 ENCOUNTER — Other Ambulatory Visit (INDEPENDENT_AMBULATORY_CARE_PROVIDER_SITE_OTHER): Payer: 59

## 2011-03-21 DIAGNOSIS — K5190 Ulcerative colitis, unspecified, without complications: Secondary | ICD-10-CM

## 2011-03-22 ENCOUNTER — Ambulatory Visit (INDEPENDENT_AMBULATORY_CARE_PROVIDER_SITE_OTHER)
Admission: RE | Admit: 2011-03-22 | Discharge: 2011-03-22 | Disposition: A | Discharge status: Home / Self Care | Payer: 59 | Source: Ambulatory Visit | Attending: Gastroenterology | Admitting: Gastroenterology

## 2011-03-22 ENCOUNTER — Encounter: Payer: Self-pay | Admitting: Gastroenterology

## 2011-03-22 ENCOUNTER — Ambulatory Visit (INDEPENDENT_AMBULATORY_CARE_PROVIDER_SITE_OTHER): Payer: 59 | Admitting: Gastroenterology

## 2011-03-22 DIAGNOSIS — R7611 Nonspecific reaction to tuberculin skin test without active tuberculosis: Secondary | ICD-10-CM

## 2011-03-22 DIAGNOSIS — K639 Disease of intestine, unspecified: Secondary | ICD-10-CM

## 2011-03-22 DIAGNOSIS — K5190 Ulcerative colitis, unspecified, without complications: Secondary | ICD-10-CM

## 2011-03-22 DIAGNOSIS — G4701 Insomnia due to medical condition: Secondary | ICD-10-CM | POA: Insufficient documentation

## 2011-03-22 MED ORDER — ZOLPIDEM TARTRATE 10 MG PO TABS: 10.0000 mg | ORAL_TABLET | Freq: Every evening | ORAL | Status: DC | PRN

## 2011-03-22 NOTE — Patient Instructions (Addendum)
Your procedure has been scheduled for 04/03/2011, please follow the seperate instructions.  Please go to the basement radiology today for your chest xray. Take Prednisone two tablets by mouth twice a day. Use Imodium as needed. Make an office visit to come back and see Dr Sharlett Iles in one month. You have signed a records release for Dr Sharlett Iles to get your recent records from Dr Estanislado Pandy. Today you were given information on the Colitis foundation.  Buy Caltrate OTC and take one tablet by mouth twice a day.

## 2011-03-22 NOTE — Progress Notes (Signed)
This is a very nice 60 year old Caucasian male who has had previous polyarticular arthritis, bilateral knee replacements, and apparently resolved Azulfidine for several years by Dr. Matthew Saras. Rheumatology records have been requested for review. He also was treated for positive PPD with INH 300 mg a day and B6 25 mg a day from 01/31/2010 to 10/25/2010. Apparently he did not have active tuberculosis. The patient relates that his diarrhea and colitis symptoms began while he was taken INH. He appears now to have an inflammatory ulcerative colitis picture partially in remission all prednisone 40 mg a day, Lialda 2.4 g twice a day. Patient also takes Imodium twice a day. His diarrhea is controlled during the day but he does have frequent nonbloody diarrheal stools at bedtime. He has chronic insomnia also. His arthritic complaints are markedly improved, and he denies other psychiatric or general medical issues. His appetite is good and his weight is stable. His CRP is decreased from 15-8  Current Medications, Allergies, Past Medical History, Past Surgical History, Family History and Social History were reviewed in [employer] record.  Pertinent Review of Systems Negative... no cardiovascular, pulmonary, or psychiatric problems. 10 point review of systems otherwise negative.   Physical Exam: Awake alert no acute distress. I cannot appreciate stigmata of chronic liver disease. His chest is entirely clear to percussion and auscultation. He is in a regular rhythm without murmurs gallops or rubs. There is no organomegaly, abdominal masses or tenderness. Bowel sounds are normal.  Extremities showed no edema, phlebitis, or swollen joints. Mental status is normal without gross focal neurological deficits.    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 I  have ordered repeat chest x-ray because of his history of a positive PPD with INH therapy. He has no current pulmonary problems. He is to continue his current medications but to switch his prednisone to 10 mg twice a day. He can use Imodium every 6-8 hours as needed. I also prescribed some when necessary Ambien 10 mg at bedtime. We will request records from rheumatology for review. Also he has been asked to take Caltrate 600 with vitamin D 2 a day. I have scheduled outpatient flexible sigmoidoscopy in 2 weeks' time. Information concerning the [facility] [place] also is provided. I will see him back in one month's time.

## 2011-03-27 DIAGNOSIS — K621 Rectal polyp: Secondary | ICD-10-CM

## 2011-03-27 HISTORY — DX: Rectal polyp: K62.1

## 2011-04-03 ENCOUNTER — Encounter: Payer: Self-pay | Admitting: Gastroenterology

## 2011-04-03 ENCOUNTER — Ambulatory Visit (AMBULATORY_SURGERY_CENTER): Payer: 59 | Admitting: Gastroenterology

## 2011-04-03 ENCOUNTER — Other Ambulatory Visit: Payer: Self-pay | Admitting: Gastroenterology

## 2011-04-03 VITALS — BP 128/77 | HR 54 | Temp 97.6°F | Resp 16 | Ht 71.0 in | Wt 210.0 lb

## 2011-04-03 DIAGNOSIS — D126 Benign neoplasm of colon, unspecified: Secondary | ICD-10-CM

## 2011-04-03 DIAGNOSIS — K5190 Ulcerative colitis, unspecified, without complications: Secondary | ICD-10-CM

## 2011-04-03 DIAGNOSIS — K5120 Ulcerative (chronic) proctitis without complications: Secondary | ICD-10-CM

## 2011-04-03 DIAGNOSIS — R195 Other fecal abnormalities: Secondary | ICD-10-CM

## 2011-04-03 DIAGNOSIS — R197 Diarrhea, unspecified: Secondary | ICD-10-CM

## 2011-04-03 MED ORDER — SODIUM CHLORIDE 0.9 % IV SOLN: 500.0000 mL | INTRAVENOUS | Status: DC

## 2011-04-03 NOTE — Progress Notes (Signed)
Pt to the restroom before discharge.  NO COMPLAINTS NOTED. MAW

## 2011-04-03 NOTE — Patient Instructions (Addendum)
See the picture page for your findings from your exam today.  Follow the green and blue discharge instruction sheets the rest of the day.  Resume your prior medications today.   Dr Sharlett Iles want to see you in the office the end of next week, appointment scheduled for Friday 04-12-11 at 08:45 on the 3rd floor.  Please call if any questions or concerns.

## 2011-04-04 ENCOUNTER — Telehealth: Payer: Self-pay | Admitting: *Deleted

## 2011-04-04 NOTE — Telephone Encounter (Signed)
No id on machine, no message left

## 2011-04-09 ENCOUNTER — Encounter: Payer: Self-pay | Admitting: Gastroenterology

## 2011-04-09 NOTE — Progress Notes (Signed)
Already has an appt and last c diff was July which was ok per Dr Sharlett Iles.

## 2011-04-12 ENCOUNTER — Ambulatory Visit (INDEPENDENT_AMBULATORY_CARE_PROVIDER_SITE_OTHER): Payer: 59 | Admitting: Gastroenterology

## 2011-04-12 ENCOUNTER — Other Ambulatory Visit: Payer: Self-pay | Admitting: Gastroenterology

## 2011-04-12 ENCOUNTER — Telehealth: Payer: Self-pay | Admitting: *Deleted

## 2011-04-12 ENCOUNTER — Encounter: Payer: Self-pay | Admitting: Gastroenterology

## 2011-04-12 VITALS — BP 118/72 | HR 60 | Ht 71.0 in | Wt 207.0 lb

## 2011-04-12 DIAGNOSIS — K639 Disease of intestine, unspecified: Secondary | ICD-10-CM

## 2011-04-12 DIAGNOSIS — K5190 Ulcerative colitis, unspecified, without complications: Secondary | ICD-10-CM

## 2011-04-12 MED ORDER — ADALIMUMAB 40 MG/0.8ML ~~LOC~~ KIT: PACK | SUBCUTANEOUS | Status: DC

## 2011-04-12 MED ORDER — ADALIMUMAB 40 MG/0.8ML ~~LOC~~ KIT: 40.0000 mg | PACK | SUBCUTANEOUS | Status: DC

## 2011-04-12 NOTE — Telephone Encounter (Signed)
Per Dr Loralyn Freshwater is not approved for UC, he has spoke with the rep. I have left a message for the patient to call back. I have also sent in rxs for Humira starter kit and biweekly doses. I have a starter kit for patient to pick up as well about the drug.

## 2011-04-12 NOTE — Progress Notes (Signed)
History of Present Illness: This is a extremely pleasant 60 year old Caucasian male with refractory ulcerative colitis confirmed a recent flexible sigmoidoscopy and biopsies. Despite being on prednisone 40 mg a day and Lialda 4.8 g a day, he continues with 5-6 loose bowel movements a day with mild cramping and very minimal bleeding. He has associated arthralgias and myalgias from the prednisone, and is on potassium replacements and calcium with vitamin D. The patient relates he really has had no response to any therapy to this point. Review of his labs shows a previously negative stool C. difficile toxin by PCR. He continues to not abuse of alcohol, cigarettes, or NSAIDs. As per my previous notes, he has a history of a previously positive PPD skin test, and was on 6 months of INH. Recent chest x-ray was entirely normal. The patient denies any cardiopulmonary symptomatology. At tbe time of his recent exam he had an inflammatory polyp excised. One of his main complaint is general malaise and fatigue. Has previously been evaluated by rheumatology. Associated with this prednisone use is worsening insomnia.   Current Medications, Allergies, Past Medical History, Past Surgical History, Family History and Social History were reviewed in [employer] record.   Assessment and plan: Refractory ulcerative colitis with recent CRP 8 which is not improved from baseline values. He currently is having side effects from corticosteroid therapy, and at this point it is my opinion that he needs to move to biological or before as IBD. We will start Humira subcutaneous injection therapy as per home healthcare. I've cut his prednisone to 20 mg a day,Lialda 2.4 g a day, will continue calcium with vitamin D, twice a day Imodium, and potassium chloride. I've consulted infectious disease and it appears reasonable to treat him with close attention to his pulmonary status and serial chest x-rays. Hopefully  We'll be  able to taper him off of prednisone rather quickly. Encounter Diagnosis  Name Primary?  . UC (ulcerative colitis) Yes

## 2011-04-12 NOTE — Patient Instructions (Signed)
Decrease Prednisone to 45m once a day in the morning. Decrease Lialda to 2 tablets by mouth once a day.  We will start the process for [place], and once we get a call back about the drug coverage we will contact you, if you have not heard anything in a week please call [phone] [facility]  You will need an office visit for follow up after the third injection.

## 2011-04-12 NOTE — Telephone Encounter (Signed)
Pharmacy states that 6 pens comes in the starter kit and the day 29 pen comes as pen one in the separate rx. I have sent all rxs. They will let me know if for some reason insurance wont cover the drug.

## 2011-04-16 ENCOUNTER — Telehealth: Payer: Self-pay | Admitting: Gastroenterology

## 2011-04-16 ENCOUNTER — Encounter: Payer: Self-pay | Admitting: *Deleted

## 2011-04-16 NOTE — Progress Notes (Signed)
  Subjective:    Patient ID: Christopher Smith, male    DOB: 1950-10-19, 60 y.o.   MRN: [id number]  HPI    Review of Systems     Objective:   Physical Exam        Assessment & Plan:  Received info from [employer], pt assistance for Cimzia, to complete info for assistance. Informed Kelli Hope at [phone], that Dr Sharlett Iles has ordered Humira for pt since Cimzia is off label .

## 2011-04-16 NOTE — Telephone Encounter (Signed)
Informed pt his insurance will not pay for Cimzia and Dr Sharlett Iles has ordered Humira for him, pt stated understanding and will come for teaching tomorrow am.

## 2011-04-19 ENCOUNTER — Telehealth: Payer: Self-pay | Admitting: *Deleted

## 2011-04-19 DIAGNOSIS — K5190 Ulcerative colitis, unspecified, without complications: Secondary | ICD-10-CM

## 2011-04-19 MED ORDER — ADALIMUMAB 40 MG/0.8ML ~~LOC~~ KIT: 40.0000 mg | PACK | SUBCUTANEOUS | Status: DC

## 2011-04-19 NOTE — Telephone Encounter (Signed)
Pt called to report he took his 1st dose of Humira- by the way it did burn going in. He has enough pens for the 2nd dose and that's it. Informed pt 2 scripts were sent for injections q 14 days. I will resend the order. Pt will notify us when he is due for the 3rd dose so we can schedule his f/u appt.

## 2011-04-23 ENCOUNTER — Ambulatory Visit: Payer: 59 | Admitting: Gastroenterology

## 2011-04-24 NOTE — Telephone Encounter (Signed)
I completed this under another note.

## 2011-05-09 ENCOUNTER — Telehealth: Payer: Self-pay | Admitting: Gastroenterology

## 2011-05-09 NOTE — Telephone Encounter (Signed)
Informed pt per Dr Hilarie Fredrickson, it's ok to take the flu shot while on Hurmira. He then stated he is still having diarrhea about as bad as before he started Humira. Pt stated he spoke with a [facility] nurse who told him it may take up to 3 months for him to see improvement. Pt stated he was better off when he just took Prednisone and we talked about his quality of life issues. He did apply for membership in the [employer], but it's been months and he hasn't heard from them. Scheduled pt for an appt with Dr Sharlett Iles after his 3rd injection so he may talk to him about it. Instructed pt to call me anytime he needs to vent. Called Humira , [phone], and spoke with a nurse who asked that pt call her rather than he initiating the call. She would like to register the pt for their Compliance Program so he can have regular f/u. lmom for pt to call back.

## 2011-05-10 NOTE — Telephone Encounter (Signed)
Late entry. I spoke with the pt yesterday afternoon and advised him of the RN program with Humira as well as the Compliance program; encouraged him to call. Pt will call us with future problems or questions.

## 2011-05-14 ENCOUNTER — Other Ambulatory Visit: Payer: Self-pay | Admitting: Gastroenterology

## 2011-05-16 ENCOUNTER — Other Ambulatory Visit: Payer: Self-pay | Admitting: Gastroenterology

## 2011-05-16 MED ORDER — ZOLPIDEM TARTRATE 10 MG PO TABS: 10.0000 mg | ORAL_TABLET | Freq: Every evening | ORAL | Status: DC | PRN

## 2011-05-17 ENCOUNTER — Telehealth: Payer: Self-pay | Admitting: Gastroenterology

## 2011-05-17 NOTE — Telephone Encounter (Signed)
lmom x 2 for pt to call back.

## 2011-05-17 NOTE — Telephone Encounter (Signed)
Spoke with pt who stated he got his nerve up at lunch and gave his shot; informed pt to call back at any time he needs to talk; pt stated understanding.

## 2011-05-20 ENCOUNTER — Encounter: Payer: Self-pay | Admitting: Gastroenterology

## 2011-05-21 ENCOUNTER — Ambulatory Visit (INDEPENDENT_AMBULATORY_CARE_PROVIDER_SITE_OTHER): Payer: 59 | Admitting: Gastroenterology

## 2011-05-21 ENCOUNTER — Encounter: Payer: Self-pay | Admitting: Gastroenterology

## 2011-05-21 VITALS — BP 104/72 | HR 60 | Ht 71.0 in | Wt 214.0 lb

## 2011-05-21 DIAGNOSIS — R197 Diarrhea, unspecified: Secondary | ICD-10-CM

## 2011-05-21 DIAGNOSIS — K529 Noninfective gastroenteritis and colitis, unspecified: Secondary | ICD-10-CM

## 2011-05-21 DIAGNOSIS — K5190 Ulcerative colitis, unspecified, without complications: Secondary | ICD-10-CM

## 2011-05-21 MED ORDER — ADALIMUMAB 40 MG/0.8ML ~~LOC~~ KIT: PACK | SUBCUTANEOUS | Status: DC

## 2011-05-21 MED ORDER — PREDNISONE 10 MG PO TABS: ORAL_TABLET | ORAL | Status: DC

## 2011-05-21 NOTE — Patient Instructions (Signed)
You are to taper off your prednisone.  We have sent a prescription for prednisone to your pharmacy. You are to take 34m for 2 weeks, 114m For 2 weeks, 39m439mor 2 weeks. Then discontinue taking the medication. Follow up with Dr. PatSharlett Ilester the Prednisone taper.  We have sent all prescriptions to your pharmacy. Continue [facility] CC: BriLaurena Spies

## 2011-05-21 NOTE — Progress Notes (Signed)
History of Present Illness: This is a 60 year old Caucasian male with severe ulcerative colitis refractory to by mouth aminosalicylate's hand prednisone. He is begun Humira injections every other week after initial induction doses. He continues to have 6 loose bowel movements a day without rectal bleeding, abdominal pain, or systemic complaints. He otherwise feels well, and has noticed fever, chills, has had improvement in his arthritis, and has a good sense of well-being.    Current Medications, Allergies, Past Medical History, Past Surgical History, Family History and Social History were reviewed in [employer] record.   Assessment and plan: Severe pan ulcerative colitis hopefully coming into remission. We will continue Humira and slowly taper his prednisone while continuing aminosalicylate. I've checked CRP level and stool lactoferrin and calprotectin levels to see if we are having any further clinical response with biological therapy. He seems free of any side effects this time. I have added WelChol one tablet daily to see if he has an element of bile-salt diarrhea. We'll see him back in 6 weeks' time for followup. No diagnosis found.

## 2011-05-23 ENCOUNTER — Other Ambulatory Visit: Payer: 59

## 2011-05-23 DIAGNOSIS — K5190 Ulcerative colitis, unspecified, without complications: Secondary | ICD-10-CM

## 2011-05-24 ENCOUNTER — Telehealth: Payer: Self-pay | Admitting: Gastroenterology

## 2011-05-24 ENCOUNTER — Other Ambulatory Visit: Payer: 59

## 2011-05-24 DIAGNOSIS — K5190 Ulcerative colitis, unspecified, without complications: Secondary | ICD-10-CM

## 2011-05-24 MED ORDER — COLESEVELAM HCL 625 MG PO TABS: ORAL_TABLET | ORAL | Status: DC

## 2011-05-24 NOTE — Telephone Encounter (Signed)
lmom for pt that I ordered his Welchol; call back for problems.

## 2011-05-24 NOTE — Telephone Encounter (Signed)
Message copied by Annabell Sabal on Fri May 24, 2011  3:01 PM ------      Message from: [place], [place] R      Created: Fri May 24, 2011 12:41 PM       Doristine Devoid !!!continue prednisone taper and Humira

## 2011-05-25 LAB — FECAL LACTOFERRIN, QUANT: Lactoferrin: POSITIVE

## 2011-05-27 ENCOUNTER — Telehealth: Payer: Self-pay | Admitting: *Deleted

## 2011-05-27 DIAGNOSIS — K529 Noninfective gastroenteritis and colitis, unspecified: Secondary | ICD-10-CM

## 2011-05-27 DIAGNOSIS — K5190 Ulcerative colitis, unspecified, without complications: Secondary | ICD-10-CM

## 2011-05-27 NOTE — Telephone Encounter (Signed)
Message copied by Lance Morin on Mon May 27, 2011  1:40 PM ------      Message from: Sharlett Iles, DAVID R      Created: Mon May 27, 2011 10:28 AM       NEED CALPROTECTIN

## 2011-05-27 NOTE — Telephone Encounter (Signed)
lmom for pt to call back- he needs a stool for CALPROTECTIN.

## 2011-05-28 ENCOUNTER — Telehealth: Payer: Self-pay | Admitting: Gastroenterology

## 2011-05-28 NOTE — Telephone Encounter (Signed)
Gave pt his lab results. He was happy with them. He said Dr. Sharlett Iles has made him feel better than he has been feeling in years.

## 2011-05-30 NOTE — Telephone Encounter (Signed)
Spoke with pt who stated he started to turn around last Thursday- decrease in diarrhea, and Monday he started the [facility] and he can't believe how much better he feels! He was really down and thought he was going to have a Colostomy, but now, he thinks there's hope! He still has diarrhea, but nothing like it was. I think he stated his next Humira is next week. Dr Sharlett Iles, does pt still need th CALPROTECTIN done? Thanks.

## 2011-05-31 ENCOUNTER — Other Ambulatory Visit: Payer: 59

## 2011-05-31 NOTE — Telephone Encounter (Signed)
lmom for pt that he needs the stool test; he may call back for questions.

## 2011-06-03 ENCOUNTER — Other Ambulatory Visit (INDEPENDENT_AMBULATORY_CARE_PROVIDER_SITE_OTHER): Payer: 59

## 2011-06-03 DIAGNOSIS — K5190 Ulcerative colitis, unspecified, without complications: Secondary | ICD-10-CM

## 2011-06-03 DIAGNOSIS — K529 Noninfective gastroenteritis and colitis, unspecified: Secondary | ICD-10-CM

## 2011-06-03 DIAGNOSIS — R197 Diarrhea, unspecified: Secondary | ICD-10-CM

## 2011-06-05 ENCOUNTER — Other Ambulatory Visit: Payer: Self-pay | Admitting: *Deleted

## 2011-06-05 MED ORDER — ADALIMUMAB 40 MG/0.8ML ~~LOC~~ KIT: PACK | SUBCUTANEOUS | Status: DC

## 2011-06-28 ENCOUNTER — Telehealth: Payer: Self-pay | Admitting: Gastroenterology

## 2011-06-28 NOTE — Telephone Encounter (Signed)
Noted  

## 2011-06-28 NOTE — Telephone Encounter (Addendum)
lmom for pt to call back with S&S. Pt called to report his has a runny nose- clear drainage, head cold symptoms, a little cough that started yesterday. Pt was told to call for any signs of an infection. He reports he had a Humira inj this am and he has an OV on 07/12/11. Dr Sharlett Iles, please advise?  Thanks.

## 2011-06-28 NOTE — Telephone Encounter (Signed)
Pt instructed to inform us if his s&s worsen: drainage turns yellow or green; he has a fever, etc. Pt stated understanding and he has an appt on 07/02/11 instead of 07/12/11.

## 2011-07-02 ENCOUNTER — Encounter: Payer: Self-pay | Admitting: Gastroenterology

## 2011-07-02 ENCOUNTER — Ambulatory Visit (INDEPENDENT_AMBULATORY_CARE_PROVIDER_SITE_OTHER): Payer: 59 | Admitting: Gastroenterology

## 2011-07-02 VITALS — BP 132/76 | HR 60 | Ht 71.0 in | Wt 211.0 lb

## 2011-07-02 DIAGNOSIS — Z9049 Acquired absence of other specified parts of digestive tract: Secondary | ICD-10-CM

## 2011-07-02 DIAGNOSIS — J069 Acute upper respiratory infection, unspecified: Secondary | ICD-10-CM

## 2011-07-02 DIAGNOSIS — Z9089 Acquired absence of other organs: Secondary | ICD-10-CM

## 2011-07-02 DIAGNOSIS — K9089 Other intestinal malabsorption: Secondary | ICD-10-CM

## 2011-07-02 DIAGNOSIS — K5190 Ulcerative colitis, unspecified, without complications: Secondary | ICD-10-CM

## 2011-07-02 NOTE — Progress Notes (Signed)
This is a 60 year old Caucasian male that we have been following for severe ulcerative colitis. He was on high-dose prednisone therapy, but has tapered off of his prednisone and is using Humira 40 mg every other week. He is having fairly steady improvement with CRP decreasing from 40-1. He still has some loose stools, occasional heme, but denies abdominal pain or systemic complaints. He is tapered off her prednisone, and has had expected myalgias and arthralgias. The patient continues on Lialda 2.4 g a day. He recently has had a head cold with congestion but no purulent sputum, fever or chills. He also denies any cardiopulmonary symptoms. 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 Current Medications, Allergies, Past Medical History, Past Surgical History, Family History and Social History were reviewed in [employer] record.  Pertinent Review of Systems Negative   Physical Exam: Awake and alert in no acute distress. Examination of the oropharynx is unremarkable and there is no sinus tenderness. I cannot appreciate lymphadenopathy or thyromegaly. Chest is entirely clear to percussion and auscultation. He is in a regular rhythm without murmurs gallops or rubs. There is no hepatosplenomegaly, abdominal masses or tenderness. Bowel sounds are normal. Peripheral extremities unremarkable. Mental status is normal but the patient does appear to be mildly depressed.    Assessment and Plan: Severe ulcerative colitis improving all biological therapy and by mouth aminosalicylate. We will continue all medications as listed in his chart with office followup in 2 months. He does not appear to have a bacterial ENT or pulmonary infection at this time, but is to call if he develops fever, purulent sputum, shortness of breath or other symptomatology. 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 Encounter Diagnoses  Name Primary?  . UC (ulcerative colitis) Yes  . URI (upper respiratory infection)

## 2011-07-02 NOTE — Patient Instructions (Signed)
Continue all medications. Make office visit to follow up in 2 months.

## 2011-07-03 ENCOUNTER — Other Ambulatory Visit: Payer: Self-pay | Admitting: Gastroenterology

## 2011-07-30 ENCOUNTER — Telehealth: Payer: Self-pay | Admitting: Gastroenterology

## 2011-07-31 NOTE — Telephone Encounter (Signed)
Spoke with [facility] and CLAIM # [id number] is for the Calprotectin lab; they suggest we resubmit to [facility]. Called [facility] and added DX Codes 558.9 and 787.91; they will refile. lmom for  informing pt.

## 2011-07-31 NOTE — Telephone Encounter (Signed)
lmom for pt to call back; i can't find in the chart anything about stool cards.

## 2011-08-06 ENCOUNTER — Other Ambulatory Visit: Payer: Self-pay | Admitting: Gastroenterology

## 2011-08-08 ENCOUNTER — Telehealth: Payer: Self-pay | Admitting: Gastroenterology

## 2011-08-08 MED ORDER — ADALIMUMAB 40 MG/0.8ML ~~LOC~~ KIT: PACK | SUBCUTANEOUS | Status: DC

## 2011-08-08 MED ORDER — MESALAMINE 1.2 G PO TBEC: 1200.0000 mg | DELAYED_RELEASE_TABLET | Freq: Every day | ORAL | Status: DC

## 2011-08-08 NOTE — Telephone Encounter (Signed)
Pt reports he can't afford the monthly Lialda ordered;  the pharmacy stated it's cheaper for him to order 120 pills rather than the 60. He requests Lialda be ordered at [facility]. He would like to begin ordering Humira at his mail order facility, [facility] .  Done.

## 2011-08-08 NOTE — Telephone Encounter (Signed)
lmom for pt to call back and state whether he wants his order at [facility] or [facility].

## 2011-08-08 NOTE — Telephone Encounter (Signed)
Addended by: Lance Morin on: 08/08/2011 01:00 PM   Modules accepted: Orders

## 2011-08-23 ENCOUNTER — Telehealth: Payer: Self-pay | Admitting: Gastroenterology

## 2011-08-26 NOTE — Telephone Encounter (Signed)
Pt reports claim for Calprotectin lab on 05/24/11 has still not been paid for and no new codes were submitted per [facility] at [phone]. Called Carmen in [place] at [place] who resubmitted the claim today with the new DX codes. LMOM informing pt.

## 2011-08-26 NOTE — Telephone Encounter (Signed)
lmom for pt to call.

## 2011-09-03 ENCOUNTER — Other Ambulatory Visit (INDEPENDENT_AMBULATORY_CARE_PROVIDER_SITE_OTHER): Payer: 59

## 2011-09-03 ENCOUNTER — Encounter: Payer: Self-pay | Admitting: Gastroenterology

## 2011-09-03 ENCOUNTER — Ambulatory Visit (INDEPENDENT_AMBULATORY_CARE_PROVIDER_SITE_OTHER): Payer: 59 | Admitting: Gastroenterology

## 2011-09-03 DIAGNOSIS — K529 Noninfective gastroenteritis and colitis, unspecified: Secondary | ICD-10-CM

## 2011-09-03 DIAGNOSIS — K5190 Ulcerative colitis, unspecified, without complications: Secondary | ICD-10-CM

## 2011-09-03 DIAGNOSIS — R197 Diarrhea, unspecified: Secondary | ICD-10-CM

## 2011-09-03 DIAGNOSIS — R7302 Impaired glucose tolerance (oral): Secondary | ICD-10-CM

## 2011-09-03 DIAGNOSIS — R7309 Other abnormal glucose: Secondary | ICD-10-CM

## 2011-09-03 LAB — CBC WITH DIFFERENTIAL/PLATELET
Basophils Relative: 0.3 % (ref 0.0–3.0)
Eosinophils Relative: 2.7 % (ref 0.0–5.0)
HCT: 40.1 % (ref 39.0–52.0)
MCV: 87.2 fl (ref 78.0–100.0)
Monocytes Absolute: 0.7 10*3/uL (ref 0.1–1.0)
Monocytes Relative: 8.1 % (ref 3.0–12.0)
Neutrophils Relative %: 68.5 % (ref 43.0–77.0)
RBC: 4.6 Mil/uL (ref 4.22–5.81)
WBC: 8.6 10*3/uL (ref 4.5–10.5)

## 2011-09-03 MED ORDER — HYDROCORTISONE ACETATE 25 MG RE SUPP: 25.0000 mg | Freq: Two times a day (BID) | RECTAL | Status: DC

## 2011-09-03 MED ORDER — ADALIMUMAB 40 MG/0.8ML ~~LOC~~ KIT: PACK | SUBCUTANEOUS | Status: DC

## 2011-09-03 MED ORDER — MESALAMINE 1.2 G PO TBEC: DELAYED_RELEASE_TABLET | ORAL | Status: DC

## 2011-09-03 MED ORDER — COLESEVELAM HCL 625 MG PO TABS: ORAL_TABLET | ORAL | Status: DC

## 2011-09-03 NOTE — Progress Notes (Addendum)
This is a 61 year old Caucasian male with refractory ulcerative colitis. He previously had fairly good response to Humira 40 mg every other week, was able to taper off her prednisone. He now relates having multiple liquidy bowel movements a day without real abdominal pain or systemic complaints. He is also on Lialda 2.4 g a day and Welchol 625 mg a day for suspected bile salt enteropathy as part of his problem. He is eating well, denies fever chills, skin rashes or joint pains. Patient does have mild glucose intolerance he attributes to previous prednisone therapy.  Current Medications, Allergies, Past Medical History, Past Surgical History, Family History and Social History were reviewed in [employer] record.  Pertinent Review of Systems Negative   Physical Exam: Very flat affect continues without much emotional expression. I cannot appreciate stigmata of chronic liver disease, and he does not appear healed. Chest is clear he is in a regular rhythm without murmurs gallops or rubs. There is no organomegaly, abdominal masses or tenderness, and bowel sounds are normal. Mental status is normal, and peripheral extremities are unremarkable. Inspection of his rectum is unremarkable and rectal exam shows no masses or tenderness with brown liquid stool which is guaiac positive    Assessment and Plan: Refractory ulcerative colitis despite Humira therapy. I've asked him to bring in a stool for C. difficile toxin assay, we'll repeat his labs and CRP, I have increased his Lialda 4.8 g a day, Anusol-HC suppositories at bedtime, and have increased his Humira to 40 mg weekly. I have discussed other options with the patient including hospitalization, reinstitution of steroids, versus possible surgical intervention. He will probably need referral to a tertiary [facility] . When necessary Imodium use also suggested. Encounter Diagnosis  Name Primary?  . Ulcerative colitis Yes

## 2011-09-03 NOTE — Patient Instructions (Addendum)
Please go to the basement today for your labs.  Increase your Humira to every week, a new rx has been sent to your mail order pharmacy.  Increase your Lialda to two tablets by mouth twice a day, samples given. Use anusol suppositories per rectum twice a day, rx has been sent to your local [place].  Make an office visit to come back in 2 weeks.  Welchol also refilled to [facility].

## 2011-09-04 ENCOUNTER — Other Ambulatory Visit: Payer: 59

## 2011-09-06 LAB — CLOSTRIDIUM DIFFICILE BY PCR: Toxigenic C. Difficile by PCR: NOT DETECTED

## 2011-09-11 ENCOUNTER — Telehealth: Payer: Self-pay | Admitting: Gastroenterology

## 2011-09-12 NOTE — Telephone Encounter (Signed)
Left message to call back  

## 2011-09-16 NOTE — Telephone Encounter (Signed)
Pt is supposed to get his refill tomorrow.

## 2011-09-19 ENCOUNTER — Encounter: Payer: Self-pay | Admitting: Gastroenterology

## 2011-09-19 ENCOUNTER — Other Ambulatory Visit (INDEPENDENT_AMBULATORY_CARE_PROVIDER_SITE_OTHER): Payer: 59

## 2011-09-19 ENCOUNTER — Ambulatory Visit (INDEPENDENT_AMBULATORY_CARE_PROVIDER_SITE_OTHER): Payer: 59 | Admitting: Gastroenterology

## 2011-09-19 DIAGNOSIS — K5190 Ulcerative colitis, unspecified, without complications: Secondary | ICD-10-CM

## 2011-09-19 LAB — HIGH SENSITIVITY CRP: CRP, High Sensitivity: 30.78 mg/L — ABNORMAL HIGH (ref 0.000–5.000)

## 2011-09-19 NOTE — Patient Instructions (Signed)
Your doctor has instructed you to go directly downstairs to have lab work drawn.  Please return one day before your next appointment for a repeat of the tab test.  Please continue taking your medications as directed.  Please follow up in one month

## 2011-09-19 NOTE — Progress Notes (Signed)
History of Present Illness: This is a 61 year old Caucasian male with refractory ulcerative colitis with recent increase in his CRP from 5-50 after coming off of prednisone which he has been told multiple times over the last year. He had been transitioned to Humira 40 mg every other week and was doing well, but recently has had a flare of his disease and we increased his Humira to 40 mg weekly and increased his Lialda 2.4 g twice a day. He feels well and denies any systemic complaints. He has approximately 2 bowel movements a day without abdominal pain, but has had intermittent rectal discomfort and bright red blood. Stool exam for C. difficile was negative. He is status post cholecystectomy and also takes WelChol 625 mg a day.    Current Medications, Allergies, Past Medical History, Past Surgical History, Family History and Social History were reviewed in [employer] record.   Assessment and plan: Blood pressure today 128/72 and pulse was 60 and regular. The patient appears healthy in no acute distress. Did not perform gentle examination. We had a long discussion today about refractory disease, and now repeat his CRP hopefully to guide [place] as to therapeutic options. For now, hold continue his sugar 40 mg every week and Lialda 2.4 g twice a day with when necessary Imodium. I have asked him to increase his WelChol to twice a day dosage. Encounter Diagnosis  Name Primary?  . Ulcerative colitis Yes

## 2011-10-07 ENCOUNTER — Telehealth: Payer: Self-pay | Admitting: Gastroenterology

## 2011-10-07 MED ORDER — ADALIMUMAB 40 MG/0.8ML ~~LOC~~ KIT: PACK | SUBCUTANEOUS | Status: DC

## 2011-10-07 NOTE — Telephone Encounter (Signed)
lmom for pt that I ordered his Humira; he may call back for questions.

## 2011-10-21 ENCOUNTER — Other Ambulatory Visit (INDEPENDENT_AMBULATORY_CARE_PROVIDER_SITE_OTHER): Payer: 59

## 2011-10-21 DIAGNOSIS — K5190 Ulcerative colitis, unspecified, without complications: Secondary | ICD-10-CM

## 2011-10-22 ENCOUNTER — Ambulatory Visit (INDEPENDENT_AMBULATORY_CARE_PROVIDER_SITE_OTHER): Payer: 59 | Admitting: Gastroenterology

## 2011-10-22 ENCOUNTER — Encounter: Payer: Self-pay | Admitting: Gastroenterology

## 2011-10-22 DIAGNOSIS — K5190 Ulcerative colitis, unspecified, without complications: Secondary | ICD-10-CM

## 2011-10-22 MED ORDER — PREDNISONE 20 MG PO TABS: 20.0000 mg | ORAL_TABLET | Freq: Every day | ORAL | Status: DC

## 2011-10-22 NOTE — Patient Instructions (Addendum)
We are referring you to Teresa Pelton at [facility], we will fax the records over today and they will contact you directly with the appt if you have not heard from them in a week please call Kianna Billet at [phone].  Take Prednisone 29m a day, a rx has been sent to your local pharmacy.  Decrease your Humira to every other week.

## 2011-10-22 NOTE — Progress Notes (Signed)
History of Present Illness: This is a 61 year old Caucasian male with refractory pan ulcerative colitis unresponsive to Lialda 2.4 g twice a day, Humira 40 mg weekly, WelChol 625 mg a day, Imodium 3-4 times a day, with previous courses of corticosteroids which calls some psychiatric disturbance. He continues with 5-6 loose bowel movements with heme daily, general malaise, and mild abdominal cramping. He is reluctant to restart corticosteroids, but says" something has to be done." We recently increased Humira from every other week and weekly dosages without response. CRP is steadily increased from a low of 7 to 42. He had an initial good response Humira for several months. Recent stool for C. difficile toxin was negative. He does have a diffuse inflammatory arthropathy associated with his IBD. On reviewing his records I cannot see where he has been on previous 6-MP or methotrexate. Last colonoscopy in September showed grade 4 severe ulcerative colitis confirmed by biopsies. Stains for CMV were negative.  Current Medications, Allergies, Past Medical History, Past Surgical History, Family History and Social History were reviewed in [employer] record.   Assessment and plan: Refractory ulcerative colitis despite biologic therapy. I have referred him to Dr. Cephas Darby at [facility] for second opinion. I have restarted prednisone 20 mg a day. This patient has previously been treated for positive PPD with INH for 6 months. He denies any cardiopulmonary complaints this time, and has had a stable chest x-ray. I have began talking about colectomy with the patient, and he is interested in a rectal sparing procedure if needed. I will be interested in Dr. Donnel Saxon opinion about whether to at this point start immunosuppressants. The problem in the past has been the urgency in obtaining results with this somewhat impatient patient. Of course 6-MP usually takes 3 months or more to be effective.  GI  division please.. Dr. Cephas Darby Encounter Diagnosis  Name Primary?  . Ulcerative colitis Yes

## 2011-11-04 ENCOUNTER — Telehealth: Payer: Self-pay | Admitting: Gastroenterology

## 2011-11-04 ENCOUNTER — Telehealth: Payer: Self-pay | Admitting: *Deleted

## 2011-11-04 NOTE — Telephone Encounter (Signed)
lmom that we got labs only back from [place]; no written ofc notes or assessment; pt may call back for questions.

## 2011-11-04 NOTE — Telephone Encounter (Signed)
I need his evaluation from [facility] before any changes in management.

## 2011-11-04 NOTE — Telephone Encounter (Signed)
Pt had called this am to ask if we had results back from [facility] and I explained we got the labs back, but not the notes from the doctor. Pt reports the md took her off the Lialda because it can cause diarrhea. Also he was told he did not need to come back to her, Dr Sharlett Iles was doing everything that could be done. She did suggest when pt can be scheduled for Remicade, we should stop the Humira. Pt reports the Prednisone really helped with the bleeding, although pt still reports diarrhea.. Please advise. Thanks.

## 2011-11-05 NOTE — Telephone Encounter (Signed)
Informed pt Dr Sharlett Iles needs the evaluation before making any decisions. Pt stated understanding and will try to call [facility] to ask for the report.

## 2011-11-06 ENCOUNTER — Telehealth: Payer: Self-pay | Admitting: *Deleted

## 2011-11-06 NOTE — Telephone Encounter (Signed)
Office note from Dr Redmond Pulling in my folder with orders from Dr Sharlett Iles on them, please see Christopher Smith.

## 2011-11-07 ENCOUNTER — Other Ambulatory Visit: Payer: Self-pay | Admitting: Gastroenterology

## 2011-11-07 DIAGNOSIS — K5190 Ulcerative colitis, unspecified, without complications: Secondary | ICD-10-CM

## 2011-11-07 NOTE — Telephone Encounter (Signed)
Patient advised remicade infusion on 11/15/2011 and that he needs to do his last Humira injection on 11/14/2011 and then stop. Patient verbalized understanding.

## 2011-11-07 NOTE — Telephone Encounter (Signed)
Advised pt that I will place remicade orders, patient will need a chest xray once a year since he has tested pos for TB and already been treated. He also was treated for positive PPD with INH 300 mg a day and B6 25 mg a day from 01/31/2010 to 10/25/2010. Last chest xray was 03/22/11.

## 2011-11-15 ENCOUNTER — Encounter (HOSPITAL_COMMUNITY)
Admission: RE | Admit: 2011-11-15 | Discharge: 2011-11-15 | Disposition: A | Discharge status: Home / Self Care | Payer: 59 | Source: Ambulatory Visit | Attending: Gastroenterology | Admitting: Gastroenterology

## 2011-11-15 ENCOUNTER — Encounter (HOSPITAL_COMMUNITY): Payer: Self-pay

## 2011-11-15 VITALS — BP 106/52 | HR 63 | Temp 97.9°F | Resp 18 | Ht 71.5 in | Wt 191.0 lb

## 2011-11-15 DIAGNOSIS — K5190 Ulcerative colitis, unspecified, without complications: Secondary | ICD-10-CM | POA: Insufficient documentation

## 2011-11-15 MED ORDER — DIPHENHYDRAMINE HCL 25 MG PO CAPS
ORAL_CAPSULE | ORAL | Status: AC
  Administered 2011-11-15: 50 mg via ORAL
  Filled 2011-11-15: qty 2

## 2011-11-15 MED ORDER — DIPHENHYDRAMINE HCL 25 MG PO TABS: 50.0000 mg | ORAL_TABLET | ORAL | Status: DC

## 2011-11-15 MED ORDER — SODIUM CHLORIDE 0.9 % IV SOLN
10.0000 mg/kg | Freq: Once | INTRAVENOUS | Status: AC
  Administered 2011-11-15: 900 mg via INTRAVENOUS
  Filled 2011-11-15: qty 90

## 2011-11-15 MED ORDER — LORATADINE 10 MG PO TABS
ORAL_TABLET | ORAL | Status: AC
  Filled 2011-11-15: qty 1

## 2011-11-15 MED ORDER — ACETAMINOPHEN 325 MG PO TABS: 650.0000 mg | ORAL_TABLET | ORAL | Status: DC

## 2011-11-15 MED ORDER — SODIUM CHLORIDE 0.9 % IV SOLN
INTRAVENOUS | Status: DC
  Administered 2011-11-15: 250 mL via INTRAVENOUS

## 2011-11-15 MED ORDER — ACETAMINOPHEN 325 MG PO TABS
ORAL_TABLET | ORAL | Status: AC
  Administered 2011-11-15: 650 mg via ORAL
  Filled 2011-11-15: qty 2

## 2011-11-15 NOTE — Discharge Instructions (Signed)
Colitis Colitis is inflammation of the colon. Colitis can be a short-term or long-standing (chronic) illness. Crohn's disease and ulcerative colitis are 2 types of colitis which are chronic. They usually require lifelong treatment. CAUSES  There are many different causes of colitis, including:  Viruses.   Germs (bacteria).   Medicine reactions.  SYMPTOMS   Diarrhea.   Intestinal bleeding.   Pain.   Fever.   Throwing up (vomiting).   Tiredness (fatigue).   Weight loss.   Bowel blockage.  DIAGNOSIS  The diagnosis of colitis is based on examination and stool or blood tests. X-rays, CT scan, and colonoscopy may also be needed. TREATMENT  Treatment may include:  Fluids given through the vein (intravenously).   Bowel rest (nothing to eat or drink for a period of time).   Medicine for pain and diarrhea.   Medicines (antibiotics) that kill germs.   Cortisone medicines.   Surgery.  HOME CARE INSTRUCTIONS   Get plenty of rest.   Drink enough water and fluids to keep your urine clear or pale yellow.   Eat a well-balanced diet.   Call your caregiver for follow-up as recommended.  SEEK IMMEDIATE MEDICAL CARE IF:   You develop chills.   You have an oral temperature above 102 F (38.9 C), not controlled by medicine.   You have extreme weakness, fainting, or dehydration.   You have repeated vomiting.   You develop severe belly (abdominal) pain or are passing bloody or tarry stools.  MAKE SURE YOU:   Understand these instructions.   Will watch your condition.   Will get help right away if you are not doing well or get worse.  Document Released: 08/08/2004 Document Revised: 06/20/2011 Document Reviewed: 11/03/2009 [facility] Patient Information 2012 [place].Infliximab injection What is this medicine? INFLIXIMAB (in [place] i mab) is used to treat Crohn's disease and ulcerative colitis. It is also used to treat ankylosing spondylitis, psoriasis, and some  forms of arthritis. This medicine may be used for other purposes; ask your health care provider or pharmacist if you have questions. What should I tell my health care provider before I take this medicine? They need to know if you have any of these conditions: -diabetes -exposure to tuberculosis -heart failure -hepatitis or liver disease -immune system problems -infection -lung or breathing disease, like COPD -multiple sclerosis -current or past resident of [place] or [place] -seizure disorder -an unusual or allergic reaction to infliximab, mouse proteins, other medicines, foods, dyes, or preservatives -pregnant or trying to get pregnant -breast-feeding How should I use this medicine? This medicine is for injection into a vein. It is usually given by a health care professional in a hospital or clinic setting. A special MedGuide will be given to you by the pharmacist with each prescription and refill. Be sure to read this information carefully each time. Talk to your pediatrician regarding the use of this medicine in children. Special care may be needed. Overdosage: If you think you have taken too much of this medicine contact a poison control center or emergency room at once. NOTE: This medicine is only for you. Do not share this medicine with others. What if I miss a dose? It is important not to miss your dose. Call your doctor or health care professional if you are unable to keep an appointment. What may interact with this medicine? Do not take this medicine with any of the following medications: -anakinra -rilonacept This medicine may also interact with the following medications: -vaccines  This list may not describe all possible interactions. Give your health care provider a list of all the medicines, herbs, non-prescription drugs, or dietary supplements you use. Also tell them if you smoke, drink alcohol, or use illegal drugs. Some items may interact with your  medicine. What should I watch for while using this medicine? Visit your doctor or health care professional for regular checks on your progress. If you get a cold or other infection while receiving this medicine, call your doctor or health care professional. Do not treat yourself. This medicine may decrease your body's ability to fight infections. Before beginning therapy, your doctor may do a test to see if you have been exposed to tuberculosis. This medicine may make the symptoms of heart failure worse in some patients. If you notice symptoms such as increased shortness of breath or swelling of the ankles or legs, contact your health care provider right away. If you are going to have surgery or dental work, tell your health care professional or dentist that you have received this medicine. If you take this medicine for plaque psoriasis, stay out of the sun. If you cannot avoid being in the sun, wear protective clothing and use sunscreen. Do not use sun lamps or tanning beds/booths. What side effects may I notice from receiving this medicine? Side effects that you should report to your doctor or health care professional as soon as possible: -allergic reactions like skin rash, itching or hives, swelling of the face, lips, or tongue -chest pain -fever or chills, usually related to the infusion -muscle or joint pain -red, scaly patches or raised bumps on the skin -signs of infection - fever or chills, cough, sore throat, pain or difficulty passing urine -swollen lymph nodes in the neck, underarm, or groin areas -unexplained weight loss -unusual bleeding or bruising -unusually weak or tired -yellowing of the eyes or skin Side effects that usually do not require medical attention (report to your doctor or health care professional if they continue or are bothersome): -headache -heartburn or stomach pain -nausea, vomiting This list may not describe all possible side effects. Call your doctor for  medical advice about side effects. You may report side effects to FDA at 1-800-FDA-1088. Where should I keep my medicine? This drug is given in a hospital or clinic and will not be stored at home. NOTE: This sheet is a summary. It may not cover all possible information. If you have questions about this medicine, talk to your doctor, pharmacist, or health care provider.  2012, Elsevier/Gold Standard. (02/17/2008 10:26:02 AM)

## 2011-11-18 ENCOUNTER — Other Ambulatory Visit: Payer: Self-pay | Admitting: Gastroenterology

## 2011-11-22 ENCOUNTER — Telehealth: Payer: Self-pay | Admitting: Gastroenterology

## 2011-11-29 ENCOUNTER — Encounter (HOSPITAL_COMMUNITY)
Admission: RE | Admit: 2011-11-29 | Discharge: 2011-11-29 | Disposition: A | Discharge status: Home / Self Care | Payer: 59 | Source: Ambulatory Visit | Attending: Gastroenterology | Admitting: Gastroenterology

## 2011-11-29 ENCOUNTER — Encounter (HOSPITAL_COMMUNITY): Payer: Self-pay

## 2011-11-29 VITALS — BP 110/61 | HR 59 | Temp 97.0°F | Resp 18 | Wt 196.8 lb

## 2011-11-29 DIAGNOSIS — K5190 Ulcerative colitis, unspecified, without complications: Secondary | ICD-10-CM

## 2011-11-29 MED ORDER — DIPHENHYDRAMINE HCL 25 MG PO TABS
25.0000 mg | ORAL_TABLET | ORAL | Status: DC
  Administered 2011-11-29 (×2): 25 mg via ORAL

## 2011-11-29 MED ORDER — DIPHENHYDRAMINE HCL 25 MG PO CAPS
ORAL_CAPSULE | ORAL | Status: AC
  Administered 2011-11-29: 25 mg via ORAL
  Filled 2011-11-29: qty 1

## 2011-11-29 MED ORDER — ACETAMINOPHEN 325 MG PO TABS
ORAL_TABLET | ORAL | Status: AC
  Administered 2011-11-29: 650 mg via ORAL
  Filled 2011-11-29: qty 2

## 2011-11-29 MED ORDER — DIPHENHYDRAMINE HCL 25 MG PO TABS
25.0000 mg | ORAL_TABLET | Freq: Once | ORAL | Status: DC
  Filled 2011-11-29: qty 1

## 2011-11-29 MED ORDER — ACETAMINOPHEN 325 MG PO TABS: 650.0000 mg | ORAL_TABLET | Freq: Once | ORAL | Status: DC

## 2011-11-29 MED ORDER — SODIUM CHLORIDE 0.9 % IV SOLN
INTRAVENOUS | Status: DC
  Administered 2011-11-29: 13:00:00 via INTRAVENOUS

## 2011-11-29 MED ORDER — ACETAMINOPHEN 325 MG PO TABS
650.0000 mg | ORAL_TABLET | ORAL | Status: DC
  Administered 2011-11-29: 650 mg via ORAL

## 2011-11-29 MED ORDER — INFLIXIMAB 100 MG IV SOLR
10.0000 mg/kg | Freq: Once | INTRAVENOUS | Status: DC
  Filled 2011-11-29: qty 90

## 2011-11-29 MED ORDER — DIPHENHYDRAMINE HCL 25 MG PO CAPS: 25.0000 mg | ORAL_CAPSULE | Freq: Once | ORAL | Status: DC

## 2011-11-29 MED ORDER — SODIUM CHLORIDE 0.9 % IV SOLN
10.0000 mg/kg | Freq: Once | INTRAVENOUS | Status: DC
  Administered 2011-11-29: 900 mg via INTRAVENOUS
  Filled 2011-11-29: qty 90

## 2011-11-29 MED ORDER — SODIUM CHLORIDE 0.9 % IV SOLN: INTRAVENOUS | Status: AC

## 2011-11-29 NOTE — Discharge Instructions (Signed)
Infliximab injection What is this medicine? INFLIXIMAB (in [place] i mab) is used to treat Crohn's disease and ulcerative colitis. It is also used to treat ankylosing spondylitis, psoriasis, and some forms of arthritis. This medicine may be used for other purposes; ask your health care provider or pharmacist if you have questions. What should I tell my health care provider before I take this medicine? They need to know if you have any of these conditions: -diabetes -exposure to tuberculosis -heart failure -hepatitis or liver disease -immune system problems -infection -lung or breathing disease, like COPD -multiple sclerosis -current or past resident of [place] or [place] -seizure disorder -an unusual or allergic reaction to infliximab, mouse proteins, other medicines, foods, dyes, or preservatives -pregnant or trying to get pregnant -breast-feeding How should I use this medicine? This medicine is for injection into a vein. It is usually given by a health care professional in a hospital or clinic setting. A special MedGuide will be given to you by the pharmacist with each prescription and refill. Be sure to read this information carefully each time. Talk to your pediatrician regarding the use of this medicine in children. Special care may be needed. Overdosage: If you think you have taken too much of this medicine contact a poison control center or emergency room at once. NOTE: This medicine is only for you. Do not share this medicine with others. What if I miss a dose? It is important not to miss your dose. Call your doctor or health care professional if you are unable to keep an appointment. What may interact with this medicine? Do not take this medicine with any of the following medications: -anakinra -rilonacept This medicine may also interact with the following medications: -vaccines This list may not describe all possible interactions. Give your health care provider  a list of all the medicines, herbs, non-prescription drugs, or dietary supplements you use. Also tell them if you smoke, drink alcohol, or use illegal drugs. Some items may interact with your medicine. What should I watch for while using this medicine? Visit your doctor or health care professional for regular checks on your progress. If you get a cold or other infection while receiving this medicine, call your doctor or health care professional. Do not treat yourself. This medicine may decrease your body's ability to fight infections. Before beginning therapy, your doctor may do a test to see if you have been exposed to tuberculosis. This medicine may make the symptoms of heart failure worse in some patients. If you notice symptoms such as increased shortness of breath or swelling of the ankles or legs, contact your health care provider right away. If you are going to have surgery or dental work, tell your health care professional or dentist that you have received this medicine. If you take this medicine for plaque psoriasis, stay out of the sun. If you cannot avoid being in the sun, wear protective clothing and use sunscreen. Do not use sun lamps or tanning beds/booths. What side effects may I notice from receiving this medicine? Side effects that you should report to your doctor or health care professional as soon as possible: -allergic reactions like skin rash, itching or hives, swelling of the face, lips, or tongue -chest pain -fever or chills, usually related to the infusion -muscle or joint pain -red, scaly patches or raised bumps on the skin -signs of infection - fever or chills, cough, sore throat, pain or difficulty passing urine -swollen lymph nodes in the neck, underarm,  or groin areas -unexplained weight loss -unusual bleeding or bruising -unusually weak or tired -yellowing of the eyes or skin Side effects that usually do not require medical attention (report to your doctor or health  care professional if they continue or are bothersome): -headache -heartburn or stomach pain -nausea, vomiting This list may not describe all possible side effects. Call your doctor for medical advice about side effects. You may report side effects to FDA at 1-800-FDA-1088. Where should I keep my medicine? This drug is given in a hospital or clinic and will not be stored at home. NOTE: This sheet is a summary. It may not cover all possible information. If you have questions about this medicine, talk to your doctor, pharmacist, or health care provider.  2012, Elsevier/Gold Standard. (02/17/2008 10:26:02 AM)

## 2011-12-03 ENCOUNTER — Telehealth: Payer: Self-pay | Admitting: Gastroenterology

## 2011-12-03 NOTE — Telephone Encounter (Signed)
Pt states he has received 2 Remicade treatments. States he thinks it is a miracle drug, he reports feeling great after just 2 treatments. He wanted to report that he has noticed that his feet are swelling. States that they are really puffy at night but in the morning they have gone down. He is scheduled for next Remicade June 14th. He wants to know if he needs to be concerned or do anything different with the swelling. Please advise.

## 2011-12-03 NOTE — Telephone Encounter (Signed)
Pt aware.

## 2011-12-03 NOTE — Telephone Encounter (Signed)
Not to worry

## 2011-12-20 ENCOUNTER — Other Ambulatory Visit: Payer: Self-pay | Admitting: Gastroenterology

## 2011-12-27 ENCOUNTER — Encounter (HOSPITAL_COMMUNITY)
Admission: RE | Admit: 2011-12-27 | Discharge: 2011-12-27 | Disposition: A | Discharge status: Home / Self Care | Payer: 59 | Source: Ambulatory Visit | Attending: Gastroenterology | Admitting: Gastroenterology

## 2011-12-27 ENCOUNTER — Encounter (HOSPITAL_COMMUNITY): Payer: Self-pay

## 2011-12-27 VITALS — BP 133/62 | HR 57 | Temp 98.4°F | Resp 16 | Wt 196.8 lb

## 2011-12-27 DIAGNOSIS — K5190 Ulcerative colitis, unspecified, without complications: Secondary | ICD-10-CM

## 2011-12-27 MED ORDER — DIPHENHYDRAMINE HCL 25 MG PO TABS
25.0000 mg | ORAL_TABLET | ORAL | Status: DC
  Administered 2011-12-27: 50 mg via ORAL

## 2011-12-27 MED ORDER — ACETAMINOPHEN 325 MG PO TABS
650.0000 mg | ORAL_TABLET | ORAL | Status: DC
  Administered 2011-12-27: 650 mg via ORAL

## 2011-12-27 MED ORDER — ACETAMINOPHEN 325 MG PO TABS
ORAL_TABLET | ORAL | Status: AC
  Administered 2011-12-27: 650 mg via ORAL
  Filled 2011-12-27: qty 2

## 2011-12-27 MED ORDER — SODIUM CHLORIDE 0.9 % IV SOLN
10.0000 mg/kg | Freq: Once | INTRAVENOUS | Status: AC
  Administered 2011-12-27: 900 mg via INTRAVENOUS
  Filled 2011-12-27: qty 90

## 2011-12-27 MED ORDER — SODIUM CHLORIDE 0.9 % IV SOLN
INTRAVENOUS | Status: DC
  Administered 2011-12-27: 13:00:00 via INTRAVENOUS

## 2011-12-27 MED ORDER — DIPHENHYDRAMINE HCL 25 MG PO CAPS
ORAL_CAPSULE | ORAL | Status: AC
  Administered 2011-12-27: 50 mg via ORAL
  Filled 2011-12-27: qty 2

## 2011-12-27 NOTE — Progress Notes (Addendum)
One hour into infusion, pt reported to RN that he feels he is having these reactions to remicade: 1. Swollen feet at night which resolves when he elevates feet. This happens 24 hours to one week after remicade. He does not know if it has happened with each infusion. (He stated he told Dr Sharlett Iles about this and MD was not concerned if swelling went away with elevation).  2. Lumbar area back pain when infusion is initiated. He points to spinal column. States that it happened with week #2 infusion. Pain is a level 5-7 which then resolves before the first 30 minute vital signs.    Patient instructed to keep tabs of any side effects on his calender and take it to Dr Sharlett Iles. RN will fax this progress note to Dr Sharlett Iles. Pt states he will make app't to see Dr Sharlett Iles.

## 2011-12-27 NOTE — Discharge Instructions (Signed)
Refer to printed sheet for next appointment. Short Stay Phone # [place] IN 8 WEEKS IS 02/21/12 AT 12:30 Friday  PT DECLINES ANY INFORMATION ON REMICADE

## 2011-12-27 NOTE — Telephone Encounter (Signed)
See previous note

## 2012-02-18 ENCOUNTER — Other Ambulatory Visit: Payer: Self-pay | Admitting: *Deleted

## 2012-02-18 DIAGNOSIS — K5190 Ulcerative colitis, unspecified, without complications: Secondary | ICD-10-CM

## 2012-02-19 ENCOUNTER — Other Ambulatory Visit (HOSPITAL_COMMUNITY): Payer: Self-pay | Admitting: *Deleted

## 2012-02-21 ENCOUNTER — Encounter (HOSPITAL_COMMUNITY)
Admission: RE | Admit: 2012-02-21 | Discharge: 2012-02-21 | Disposition: A | Discharge status: Home / Self Care | Payer: 59 | Source: Ambulatory Visit | Attending: Gastroenterology | Admitting: Gastroenterology

## 2012-02-21 VITALS — BP 111/65 | HR 55 | Temp 97.5°F | Resp 16 | Ht 71.5 in | Wt 209.1 lb

## 2012-02-21 DIAGNOSIS — K5190 Ulcerative colitis, unspecified, without complications: Secondary | ICD-10-CM | POA: Insufficient documentation

## 2012-02-21 MED ORDER — DIPHENHYDRAMINE HCL 25 MG PO CAPS
25.0000 mg | ORAL_CAPSULE | ORAL | Status: DC
  Administered 2012-02-21: 25 mg via ORAL
  Filled 2012-02-21: qty 1

## 2012-02-21 MED ORDER — SODIUM CHLORIDE 0.9 % IV SOLN
INTRAVENOUS | Status: DC
  Administered 2012-02-21: 13:00:00 via INTRAVENOUS

## 2012-02-21 MED ORDER — ACETAMINOPHEN 325 MG PO TABS
650.0000 mg | ORAL_TABLET | ORAL | Status: DC
  Administered 2012-02-21: 650 mg via ORAL
  Filled 2012-02-21: qty 2

## 2012-02-21 MED ORDER — INFLIXIMAB 100 MG IV SOLR
10.0000 mg/kg | INTRAVENOUS | Status: DC
  Administered 2012-02-21: 900 mg via INTRAVENOUS
  Filled 2012-02-21: qty 90

## 2012-02-21 NOTE — Discharge Instructions (Signed)
Refer to printed sheet for next appointment. Short Stay Phone # [phone]  REMICADE INFLIXIMAB (in [place] i mab) is used to treat Crohn's disease and ulcerative colitis. It is also used to treat ankylosing spondylitis, psoriasis, and some forms of arthritis. This medicine may be used for other purposes; ask your health care provider or pharmacist if you have questions. What should I tell my health care provider before I take this medicine? They need to know if you have any of these conditions: -diabetes -exposure to tuberculosis -heart failure -hepatitis or liver disease -immune system problems -infection -lung or breathing disease, like COPD -multiple sclerosis -current or past resident of [place] or [place] -seizure disorder -an unusual or allergic reaction to infliximab, mouse proteins, other medicines, foods, dyes, or preservatives -pregnant or trying to get pregnant -breast-feeding How should I use this medicine? This medicine is for injection into a vein. It is usually given by a health care professional in a hospital or clinic setting. A special MedGuide will be given to you by the pharmacist with each prescription and refill. Be sure to read this information carefully each time. Talk to your pediatrician regarding the use of this medicine in children. Special care may be needed. Overdosage: If you think you have taken too much of this medicine contact a poison control center or emergency room at once. NOTE: This medicine is only for you. Do not share this medicine with others. What if I miss a dose? It is important not to miss your dose. Call your doctor or health care professional if you are unable to keep an appointment. What may interact with this medicine? Do not take this medicine with any of the following medications: -anakinra -rilonacept This medicine may also interact with the following medications: -vaccines This list may not describe all possible  interactions. Give your health care provider a list of all the medicines, herbs, non-prescription drugs, or dietary supplements you use. Also tell them if you smoke, drink alcohol, or use illegal drugs. Some items may interact with your medicine. What should I watch for while using this medicine? Visit your doctor or health care professional for regular checks on your progress. If you get a cold or other infection while receiving this medicine, call your doctor or health care professional. Do not treat yourself. This medicine may decrease your body's ability to fight infections. Before beginning therapy, your doctor may do a test to see if you have been exposed to tuberculosis. This medicine may make the symptoms of heart failure worse in some patients. If you notice symptoms such as increased shortness of breath or swelling of the ankles or legs, contact your health care provider right away. If you are going to have surgery or dental work, tell your health care professional or dentist that you have received this medicine. If you take this medicine for plaque psoriasis, stay out of the sun. If you cannot avoid being in the sun, wear protective clothing and use sunscreen. Do not use sun lamps or tanning beds/booths. What side effects may I notice from receiving this medicine? Side effects that you should report to your doctor or health care professional as soon as possible: -allergic reactions like skin rash, itching or hives, swelling of the face, lips, or tongue -chest pain -fever or chills, usually related to the infusion -muscle or joint pain -red, scaly patches or raised bumps on the skin -signs of infection - fever or chills, cough, sore throat, pain or difficulty  passing urine -swollen lymph nodes in the neck, underarm, or groin areas -unexplained weight loss -unusual bleeding or bruising -unusually weak or tired -yellowing of the eyes or skin Side effects that usually do not require  medical attention (report to your doctor or health care professional if they continue or are bothersome): -headache -heartburn or stomach pain -nausea, vomiting This list may not describe all possible side effects. Call your doctor for medical advice about side effects. You may report side effects to FDA at 1-800-FDA-1088. Where should I keep my medicine? This drug is given in a hospital or clinic and will not be stored at home. NOTE: This sheet is a summary. It may not cover all possible information. If you have questions about this medicine, talk to your doctor, pharmacist, or health care provider.  2012, Elsevier/Gold Standard. (02/17/2008 10:26:02 AM)

## 2012-02-21 NOTE — Progress Notes (Signed)
Pt again states that he has spinal pain when Remicade is initiated,but resolves after it has infused for 30 minutes He did not contact Dr Sharlett Iles since last infusion but he will call him after this infusion He also states as far as his Ulcerative colitis he is having a great response to the Remicade infusions but just wanted to talk with Dr about future infusions and discuss this back/spine pain

## 2012-03-06 ENCOUNTER — Ambulatory Visit (INDEPENDENT_AMBULATORY_CARE_PROVIDER_SITE_OTHER): Payer: 59 | Admitting: Gastroenterology

## 2012-03-06 ENCOUNTER — Encounter: Payer: Self-pay | Admitting: Gastroenterology

## 2012-03-06 VITALS — BP 134/76 | HR 62 | Ht 71.5 in | Wt 209.0 lb

## 2012-03-06 DIAGNOSIS — K5190 Ulcerative colitis, unspecified, without complications: Secondary | ICD-10-CM

## 2012-03-06 NOTE — Patient Instructions (Addendum)
Follow up with Dr. Sharlett Iles in  Months.  CC: Thressa Sheller

## 2012-03-06 NOTE — Progress Notes (Signed)
This is a 63 your old Caucasian male who had refractory left-sided colitis despite by mouth amino salicylates and subcutaneous Humira injections. He saw Dr. Evalee Mutton at [facility] who suggested that we switch to intravenous Remicade. The patient has had induction therapy and to subsequent infusions of Remicade 5 mg per kilo gram, and is greatly improved and denies current GI complaints. 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 Currently is having regular bowel movements without rectal bleeding, abdominal pain, upper GI, hepatobiliary, or systemic complaints. He is off of all other inflammatory bowel disease medications. He recently had a complete physical exam by his primary care physician, and we will request these labs for review.  Current Medications, Allergies, Past Medical History, Past Surgical History, Family History and Social History were reviewed in [employer] record.  Pertinent Review of Systems Negative   Physical Exam: Blood pressure 134/76, pulse 62 and regular, weight 209 pounds with a BMI of 28.74. Is a healthy-appearing male in good humor in no acute distress. I cannot appreciate stigmata of chronic liver disease. His chest is clear to percussion of dictation. In a regular rhythm without murmurs gallops or rubs. There is no organomegaly, abdominal masses, tenderness, or abnormal bowel sounds. Peripheral extremities are unremarkable and mental status is normal.    Assessment and Plan: Previous refractory ulcerative colitis which has responded extremely well to intravenous Remicade infusions. We will continue his protocol infusions every 8 weeks with office GI followup every 6 months. I've advised him to use WelChol daily as needed. No diagnosis found.   Please copy Dr. Evalee Mutton at [facility] Department of gastroenterology and his primary care physician Dr. Teena Irani.

## 2012-03-17 ENCOUNTER — Telehealth: Payer: Self-pay | Admitting: *Deleted

## 2012-03-17 ENCOUNTER — Ambulatory Visit (INDEPENDENT_AMBULATORY_CARE_PROVIDER_SITE_OTHER)
Admission: RE | Admit: 2012-03-17 | Discharge: 2012-03-17 | Disposition: A | Discharge status: Home / Self Care | Payer: 59 | Source: Ambulatory Visit | Attending: Gastroenterology | Admitting: Gastroenterology

## 2012-03-17 DIAGNOSIS — Z9289 Personal history of other medical treatment: Secondary | ICD-10-CM

## 2012-03-17 DIAGNOSIS — Z228 Carrier of other infectious diseases: Secondary | ICD-10-CM

## 2012-03-17 NOTE — Telephone Encounter (Signed)
No increase needed

## 2012-03-17 NOTE — Telephone Encounter (Signed)
Message copied by Lance Morin on Tue Mar 17, 2012  9:31 AM ------      Message from: Bernita Buffy D      Created: Fri Dec 27, 2011  2:43 PM                   ----- Message -----         From: Sheral Flow, CMA         Sent: 03/16/2012           To: Sheral Flow, CMA            Pt needs annual chest xray for remicade

## 2012-03-17 NOTE — Telephone Encounter (Signed)
Phoned pt to inform him he needs his annual chest Xray for biologic therapy. Pt reports he's doing great on Remicade, but he knows when he's due for his next infusion. He wants to know if his dose can be increased, not the interval between infusions? Please advise. Thanks.

## 2012-03-17 NOTE — Telephone Encounter (Signed)
lmom for pt that Dr Sharlett Iles wrote "no increase at this time". Pt may call back for questions.

## 2012-04-17 ENCOUNTER — Encounter (HOSPITAL_COMMUNITY): Payer: Self-pay

## 2012-04-17 ENCOUNTER — Encounter (HOSPITAL_COMMUNITY)
Admission: RE | Admit: 2012-04-17 | Discharge: 2012-04-17 | Disposition: A | Discharge status: Home / Self Care | Payer: 59 | Source: Ambulatory Visit | Attending: Gastroenterology | Admitting: Gastroenterology

## 2012-04-17 VITALS — BP 117/72 | HR 51 | Temp 97.0°F | Resp 16 | Ht 71.5 in | Wt 207.1 lb

## 2012-04-17 DIAGNOSIS — K5190 Ulcerative colitis, unspecified, without complications: Secondary | ICD-10-CM

## 2012-04-17 MED ORDER — SODIUM CHLORIDE 0.9 % IV SOLN
INTRAVENOUS | Status: AC
  Administered 2012-04-17: 14:00:00 via INTRAVENOUS

## 2012-04-17 MED ORDER — DIPHENHYDRAMINE HCL 25 MG PO CAPS
25.0000 mg | ORAL_CAPSULE | ORAL | Status: DC
  Administered 2012-04-17: 25 mg via ORAL
  Filled 2012-04-17: qty 1

## 2012-04-17 MED ORDER — ACETAMINOPHEN 325 MG PO TABS
650.0000 mg | ORAL_TABLET | ORAL | Status: AC
  Administered 2012-04-17: 650 mg via ORAL
  Filled 2012-04-17: qty 2

## 2012-04-17 MED ORDER — SODIUM CHLORIDE 0.9 % IV SOLN
10.0000 mg/kg | INTRAVENOUS | Status: AC
  Administered 2012-04-17: 900 mg via INTRAVENOUS
  Filled 2012-04-17: qty 90

## 2012-04-20 ENCOUNTER — Telehealth: Payer: Self-pay | Admitting: Gastroenterology

## 2012-04-20 NOTE — Telephone Encounter (Signed)
Left message for patient to call back  

## 2012-04-21 NOTE — Telephone Encounter (Signed)
Patient reports joint pains since stopping Prednisone several months ago and starting on Remicade.  He notes that the Remicade infusions do not improve his joint pain, nor make it worse.  He used to see Dr. Estanislado Pandy for ?arthritis years ago.  He reports that his feet, hands, and neck are the most painful.  He notes he can't even hold a hammer.  Dr. Sharlett Iles please advise next step

## 2012-04-22 NOTE — Telephone Encounter (Signed)
F/u rheumatology

## 2012-04-22 NOTE — Telephone Encounter (Signed)
Pt aware.

## 2012-06-12 ENCOUNTER — Encounter (HOSPITAL_COMMUNITY): Payer: 59

## 2012-06-15 ENCOUNTER — Encounter (HOSPITAL_COMMUNITY)
Admission: RE | Admit: 2012-06-15 | Discharge: 2012-06-15 | Disposition: A | Discharge status: Home / Self Care | Payer: 59 | Source: Ambulatory Visit | Attending: Gastroenterology | Admitting: Gastroenterology

## 2012-06-15 ENCOUNTER — Encounter (HOSPITAL_COMMUNITY): Payer: Self-pay

## 2012-06-15 ENCOUNTER — Other Ambulatory Visit: Payer: Self-pay | Admitting: *Deleted

## 2012-06-15 VITALS — BP 107/65 | HR 58 | Temp 97.8°F | Resp 18 | Wt 209.4 lb

## 2012-06-15 DIAGNOSIS — K5090 Crohn's disease, unspecified, without complications: Secondary | ICD-10-CM

## 2012-06-15 MED ORDER — SODIUM CHLORIDE 0.9 % IV SOLN
INTRAVENOUS | Status: DC
  Administered 2012-06-15: 09:00:00 via INTRAVENOUS

## 2012-06-15 MED ORDER — SODIUM CHLORIDE 0.9 % IV SOLN
10.0000 mg/kg | INTRAVENOUS | Status: DC
  Administered 2012-06-15: 1000 mg via INTRAVENOUS
  Filled 2012-06-15: qty 100

## 2012-06-15 MED ORDER — DIPHENHYDRAMINE HCL 25 MG PO TABS
25.0000 mg | ORAL_TABLET | ORAL | Status: DC
  Administered 2012-06-15: 25 mg via ORAL
  Filled 2012-06-15 (×2): qty 1

## 2012-06-15 MED ORDER — ACETAMINOPHEN 325 MG PO TABS
650.0000 mg | ORAL_TABLET | ORAL | Status: DC
  Administered 2012-06-15: 650 mg via ORAL
  Filled 2012-06-15: qty 2

## 2012-06-19 ENCOUNTER — Encounter (HOSPITAL_COMMUNITY)
Admission: RE | Admit: 2012-06-19 | Payer: 59 | Source: Ambulatory Visit | Attending: Gastroenterology | Admitting: Gastroenterology

## 2012-07-06 ENCOUNTER — Encounter: Payer: Self-pay | Admitting: Gastroenterology

## 2012-08-05 ENCOUNTER — Other Ambulatory Visit (HOSPITAL_COMMUNITY): Payer: Self-pay | Admitting: Gastroenterology

## 2012-08-10 ENCOUNTER — Encounter (HOSPITAL_COMMUNITY)
Admission: RE | Admit: 2012-08-10 | Discharge: 2012-08-10 | Disposition: A | Discharge status: Home / Self Care | Payer: 59 | Source: Ambulatory Visit | Attending: Gastroenterology | Admitting: Gastroenterology

## 2012-08-10 ENCOUNTER — Encounter (HOSPITAL_COMMUNITY): Payer: Self-pay

## 2012-08-10 VITALS — BP 131/53 | HR 61 | Temp 97.2°F | Resp 16 | Ht 70.5 in | Wt 209.2 lb

## 2012-08-10 DIAGNOSIS — K5090 Crohn's disease, unspecified, without complications: Secondary | ICD-10-CM

## 2012-08-10 MED ORDER — DIPHENHYDRAMINE HCL 25 MG PO TABS
25.0000 mg | ORAL_TABLET | ORAL | Status: AC
  Administered 2012-08-10: 25 mg via ORAL
  Filled 2012-08-10 (×2): qty 1

## 2012-08-10 MED ORDER — ACETAMINOPHEN 325 MG PO TABS
650.0000 mg | ORAL_TABLET | ORAL | Status: AC
  Administered 2012-08-10: 650 mg via ORAL
  Filled 2012-08-10: qty 2

## 2012-08-10 MED ORDER — SODIUM CHLORIDE 0.9 % IV SOLN
INTRAVENOUS | Status: AC
  Administered 2012-08-10: 13:00:00 via INTRAVENOUS

## 2012-08-10 MED ORDER — SODIUM CHLORIDE 0.9 % IV SOLN
10.0000 mg/kg | INTRAVENOUS | Status: AC
  Administered 2012-08-10: 1000 mg via INTRAVENOUS
  Filled 2012-08-10: qty 100

## 2012-08-21 ENCOUNTER — Telehealth: Payer: Self-pay | Admitting: Gastroenterology

## 2012-08-21 NOTE — Telephone Encounter (Signed)
Informed pt Dr Sharlett Iles would like to discuss in the ofc; appt moved up to 08/25/12.

## 2012-08-21 NOTE — Telephone Encounter (Signed)
Checked with [facility] and the Shingles vaccine is a live virus and Dr Sharlett Iles states he cannot take a live virus because of his immuno supression. He may take the Pneumonia vaccine, but it will not be as effective d/t immuno supression. Pt states understanding. Pt states his arthritis was better when he first started Remicade and then it got worse. Dr Estanislado Pandy placed him on Sulfasalazine, but that gave him diarrhea. She now wants him to begin Methtrexate injections. Is Methotrexate contrandicated for pt? Thanks.

## 2012-08-21 NOTE — Telephone Encounter (Signed)
OK for pt to receive Pneumonia and Shingles Vaccine while on Remicade? Thanks.

## 2012-08-21 NOTE — Telephone Encounter (Signed)
He neeeds to see me first

## 2012-08-21 NOTE — Telephone Encounter (Signed)
No live virus..Ask pharmacy

## 2012-08-25 ENCOUNTER — Other Ambulatory Visit: Payer: 59

## 2012-08-25 ENCOUNTER — Encounter: Payer: Self-pay | Admitting: Gastroenterology

## 2012-08-25 ENCOUNTER — Ambulatory Visit (INDEPENDENT_AMBULATORY_CARE_PROVIDER_SITE_OTHER): Payer: 59 | Admitting: Gastroenterology

## 2012-08-25 VITALS — BP 104/70 | HR 68 | Ht 71.0 in | Wt 214.2 lb

## 2012-08-25 DIAGNOSIS — K639 Disease of intestine, unspecified: Secondary | ICD-10-CM

## 2012-08-25 DIAGNOSIS — Z79899 Other long term (current) drug therapy: Secondary | ICD-10-CM

## 2012-08-25 DIAGNOSIS — K5190 Ulcerative colitis, unspecified, without complications: Secondary | ICD-10-CM

## 2012-08-25 NOTE — Patient Instructions (Addendum)
You have been scheduled for a flexible sigmoidoscopy. Please follow the written instructions given to you at your visit today. If you use inhalers (even only as needed), please bring them with you on the day of your procedure.  Your physician has requested that you go to the basement for lab work before leaving today.

## 2012-08-25 NOTE — Progress Notes (Signed)
This is a somewhat complicated 62 year old Caucasian male with rather severe ulcerative colitis initially refractory dear Humira therapy, referred to [facility] to see Dr Felton Clinton. Wilson who recommended switching to Remicade infusions which the patient has been receiving for the last year.  He currently describes continued diarrhea with small volume nonbloody stools up to 8-10 per day without other gastrointestinal symptoms.  He is intolerant of Azulfidine and other amino salicylates.  He has rather severe arthropathy in his hands and feet, and recently saw Dr. Estanislado Pandy in Rheumatology who has recommended a course of Methotrexate therapy.  Patient denies associated skin rashes, mouth sores, fever, chills, or other systemic complaints.  Previous treatment for bile salt enteropathy did not help his symptomatology.  He denies abuse of NSAIDs, recent antibiotic exposure, and review of his recent labs shows normal CBC, liver function tests, and metabolic studies.  Last colonoscopy exam was in September 2012.  Current Medications, Allergies, Past Medical History, Past Surgical History, Family History and Social History were reviewed in [employer] record.  ROS: All systems were reviewed and are negative unless otherwise stated in the HPI.          Physical Exam:-appearing patient in no distress.  Blood pressure 130/70, pulse 60 and regular, and weight 214 with a BMI of 29.89.  I cannot appreciate stigmata of chronic liver disease.  His abdomen shows no organomegaly, masses or tenderness.  Bowel sounds are normal.  Inspection of rectum is unremarkable as is rectal exam.  There is soft stool present which is guaiac negative.  Mental status is normal.  Chest is clear and he is in a regular rhythm without murmurs gallops or rubs.  Cannot appreciate any acute swelling of his joints, increased heat, edema or phlebitis.    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  He probably has formed antibodies to infliximab, and we have ordered serum levels and infliximab antibodies.  I've scheduled him for repeat flexible sigmoidoscopy, and we will hold methotrexate pending his evaluation.  Treatment alternatives would include Simponi [golimumab] has a third-generation biologic versus methotrexate therapy as recommended by rheumatology.  Please copy her primary care physician, referring physician, and pertinent subspecialists.Dr. Renee Pain, Dr.Joann Redmond Pulling [facility] Dr Trilby Drummer Encounter Diagnosis  Name Primary?  . UC (ulcerative colitis) Yes

## 2012-08-26 ENCOUNTER — Telehealth: Payer: Self-pay | Admitting: *Deleted

## 2012-08-26 NOTE — Telephone Encounter (Signed)
Christopher Smith at [facility] called to ask about labs that were ordered yesterday. We finally researched and Dr Sharlett Iles had wanted serum levels and AB for Infliximab so the correct test seems to have been ordered.

## 2012-09-04 ENCOUNTER — Encounter: Payer: Self-pay | Admitting: Gastroenterology

## 2012-09-04 ENCOUNTER — Ambulatory Visit (AMBULATORY_SURGERY_CENTER): Payer: 59 | Admitting: Gastroenterology

## 2012-09-04 VITALS — BP 132/77 | HR 56 | Temp 97.5°F | Resp 16 | Ht 70.0 in | Wt 209.0 lb

## 2012-09-04 DIAGNOSIS — K5190 Ulcerative colitis, unspecified, without complications: Secondary | ICD-10-CM

## 2012-09-04 DIAGNOSIS — K529 Noninfective gastroenteritis and colitis, unspecified: Secondary | ICD-10-CM

## 2012-09-04 DIAGNOSIS — R197 Diarrhea, unspecified: Secondary | ICD-10-CM

## 2012-09-04 DIAGNOSIS — K625 Hemorrhage of anus and rectum: Secondary | ICD-10-CM

## 2012-09-04 DIAGNOSIS — D126 Benign neoplasm of colon, unspecified: Secondary | ICD-10-CM

## 2012-09-04 DIAGNOSIS — R195 Other fecal abnormalities: Secondary | ICD-10-CM

## 2012-09-04 MED ORDER — SODIUM CHLORIDE 0.9 % IV SOLN: 500.0000 mL | INTRAVENOUS | Status: DC

## 2012-09-04 NOTE — Progress Notes (Signed)
Called to room to assist during endoscopic procedure.  Patient ID and intended procedure confirmed with present staff. Received instructions for my participation in the procedure from the performing physician.  

## 2012-09-04 NOTE — Progress Notes (Signed)
This is a flex sig...procedure.

## 2012-09-04 NOTE — Op Note (Signed)
[place]  [address] [place], [id number]   FLEXIBLE SIGMOIDOSCOPY PROCEDURE REPORT  PATIENT: Christopher Smith, Christopher Smith  MR#: [id number] BIRTHDATE: 06-02-1951 , 79  yrs. old GENDER: Male ENDOSCOPIST: Sable Feil, MD, [facility] REFERRED BY: PROCEDURE DATE:  09/04/2012 PROCEDURE:   Sigmoidoscopy with biopsy and Sigmoidoscopy with snare  ASA CLASS:   Class II INDICATIONS:rectal bleeding.   uc flare. MEDICATIONS: propofol (Diprivan) 378m IV  DESCRIPTION OF PROCEDURE:   After the risks benefits and alternatives of the procedure were thoroughly explained, informed consent was obtained.  revealed no abnormalities of the rectum. The pentax ped/col [id number] endoscope was introduced through the anus and advanced to the    , limited by No adverse events experienced. The quality of the prep was excellent .  The instrument was then slowly withdrawn as the mucosa was fully examined.       the colonoscope was easily inserted into the rectum.  This was advanced through a foreshortened colon into the cecum.  There is diffuse inflammation throughout the colon with exudate and erosions.  The small bowel appeared normal for 10-15 cm. The colonscope  was then withdrawn throughout the length of the colon and multiple biopsies were obtained.  There are multiple prominent pseudopolyps in the rectosigmoid area that were removed with electrocautery snare technique.  These were retrieved and labeled specimen jar #2.  There is sparing of the distal 5 cm of the rectum.  Pictures were obtained for documentation.  The scope was then withdrawn from the patient and the procedure terminated.  COMPLICATIONS: There were no complications.  ENDOSCOPIC IMPRESSION:Chronic ulcerative colitis with multiple pseudopolyps.  Small bowel appears normal.  This was supposed to be a flexible sigmoidoscopy exam, but because of the short of the colon we did enter the small bowel.  Multiple large polyps  were excised and the rectosigmoid area to exclude adenomatous.  However, I think this patient has unresponsive altered colitis, and recent infliximab antibodies have been positive consistent with his disease recurrence.  RECOMMENDATIONS:Biopsies are pending.  We have discontinued his Remicade infusions.  He was seen by rheumatology who has suggested methotrexate therapy which I think is an appropriate avenue of therapy at this time.  This patient may need to consider total colectomy depending on his clinical course.  Of course, if these are adenomas in his colon, this would hasten surgical intervention.   REPEAT EXAM: standard discharge  _______________________________ eSigned:  DSable Feil MD, FSt [facility]02/21/2014 3:46 PM   CC:  PATIENT NAME:  Christopher Smith, TallericoMR#: [id number]

## 2012-09-04 NOTE — Progress Notes (Signed)
Report to pacu RN, vss, bbs=clear

## 2012-09-04 NOTE — Patient Instructions (Addendum)
YOU HAD AN ENDOSCOPIC PROCEDURE TODAY AT [facility]: Refer to the procedure report that was given to you for any specific questions about what was found during the examination.  If the procedure report does not answer your questions, please call your gastroenterologist to clarify.  If you requested that your care partner not be given the details of your procedure findings, then the procedure report has been included in a sealed envelope for you to review at your convenience later.  YOU SHOULD EXPECT: Some feelings of bloating in the abdomen. Passage of more gas than usual.  Walking can help get rid of the air that was put into your GI tract during the procedure and reduce the bloating. If you had a lower endoscopy (such as a colonoscopy or flexible sigmoidoscopy) you may notice spotting of blood in your stool or on the toilet paper. If you underwent a bowel prep for your procedure, then you may not have a normal bowel movement for a few days.  DIET: Your first meal following the procedure should be a light meal and then it is ok to progress to your normal diet.  A half-sandwich or bowl of soup is an example of a good first meal.  Heavy or fried foods are harder to digest and may make you feel nauseous or bloated.  Likewise meals heavy in dairy and vegetables can cause extra gas to form and this can also increase the bloating.  Drink plenty of fluids but you should avoid alcoholic beverages for 24 hours.  ACTIVITY: Your care partner should take you home directly after the procedure.  You should plan to take it easy, moving slowly for the rest of the day.  You can resume normal activity the day after the procedure however you should NOT DRIVE or use heavy machinery for 24 hours (because of the sedation medicines used during the test).    SYMPTOMS TO REPORT IMMEDIATELY: A gastroenterologist can be reached at any hour.  During normal business hours, 8:30 AM to 5:00 PM Monday through Friday,  call [phone].  After hours and on weekends, please call the GI answering service at [phone] who will take a message and have the physician on call contact you.   Following lower endoscopy (colonoscopy or flexible sigmoidoscopy):  Excessive amounts of blood in the stool  Significant tenderness or worsening of abdominal pains  Swelling of the abdomen that is new, acute  Fever of 100F or higher  FOLLOW UP: If any biopsies were taken you will be contacted by phone or by letter within the next 1-3 weeks.  Call your gastroenterologist if you have not heard about the biopsies in 3 weeks.  Our staff will call the home number listed on your records the next business day following your procedure to check on you and address any questions or concerns that you may have at that time regarding the information given to you following your procedure. This is a courtesy call and so if there is no answer at the home number and we have not heard from you through the emergency physician on call, we will assume that you have returned to your regular daily activities without incident.  SIGNATURES/CONFIDENTIALITY: You and/or your care partner have signed paperwork which will be entered into your electronic medical record.  These signatures attest to the fact that that the information above on your After Visit Summary has been reviewed and is understood.  Full responsibility of the confidentiality of this  discharge information lies with you and/or your care-partner.  DO NOT TAKE ANY ASPIRIN NOR NSAIDS X TWO WEEKS DUE TO USE OF CAUTERY DURING YOUR PROCEDURE TO PREVENT BLEEDING.  Thank-you for choosing [place] for your healthcare needs.

## 2012-09-04 NOTE — Progress Notes (Addendum)
Patient did not have preoperative order for IV antibiotic SSI prophylaxis. (G8918)  Patient did not experience any of the following events: a burn prior to discharge; a fall within the facility; wrong site/side/patient/procedure/implant event; or a hospital transfer or hospital admission upon discharge from the facility. (G8907)  

## 2012-09-07 ENCOUNTER — Telehealth: Payer: Self-pay

## 2012-09-07 NOTE — Telephone Encounter (Signed)
  Follow up Call-  Call back number 09/04/2012 04/03/2011 01/24/2011  Post procedure Call Back phone  # [phone]  Permission to leave phone message Yes - -     Patient questions:  Do you have a fever, pain , or abdominal swelling? no Pain Score  0 *  Have you tolerated food without any problems? yes  Have you been able to return to your normal activities? yes  Do you have any questions about your discharge instructions: Diet   no Medications  no Follow up visit  no  Do you have questions or concerns about your Care? no  Actions: * If pain score is 4 or above: No action needed, pain <4.

## 2012-09-08 ENCOUNTER — Ambulatory Visit: Payer: 59 | Admitting: Gastroenterology

## 2012-09-16 ENCOUNTER — Telehealth: Payer: Self-pay | Admitting: Gastroenterology

## 2012-09-16 NOTE — Telephone Encounter (Signed)
Informed pt his path looks benign and no active disease as compared to path from 04/03/11 when it showed active colitis and IBD; and conferring with Dr Fuller Plan. I will call him after Dr Sharlett Iles reviews it. Pt reports he had 1/2 dose of Methotrexate and his diarrhea may be a little better, but his arthritis is no better. Dr Sharlett Iles, please review path and how often should pt see you; states you're not getting rid of him? Thanks.

## 2012-09-19 ENCOUNTER — Encounter: Payer: Self-pay | Admitting: Gastroenterology

## 2012-09-19 NOTE — Telephone Encounter (Signed)
BIOPSIES ARE C/W PSEUODPOLYPS.,,Do MTX as per Rheumatology

## 2012-09-21 NOTE — Telephone Encounter (Signed)
lmom for pt of path report and continue with Methotrexate as ordered by Rheumatology. He may call back for questions.

## 2012-10-12 ENCOUNTER — Encounter (HOSPITAL_COMMUNITY): Payer: 59

## 2013-06-03 ENCOUNTER — Telehealth: Payer: Self-pay | Admitting: Gastroenterology

## 2013-06-03 NOTE — Telephone Encounter (Signed)
lmom for pt to call back

## 2013-06-09 NOTE — Telephone Encounter (Signed)
Pt never called back.

## 2013-10-12 ENCOUNTER — Ambulatory Visit (HOSPITAL_COMMUNITY)
Admission: RE | Admit: 2013-10-12 | Discharge: 2013-10-12 | Disposition: A | Discharge status: Home / Self Care | Payer: 59 | Source: Ambulatory Visit | Attending: Rheumatology | Admitting: Rheumatology

## 2013-10-12 ENCOUNTER — Other Ambulatory Visit (HOSPITAL_COMMUNITY): Payer: Self-pay | Admitting: Rheumatology

## 2013-10-12 DIAGNOSIS — R7611 Nonspecific reaction to tuberculin skin test without active tuberculosis: Secondary | ICD-10-CM | POA: Insufficient documentation

## 2013-10-12 DIAGNOSIS — A159 Respiratory tuberculosis unspecified: Secondary | ICD-10-CM

## 2013-11-22 ENCOUNTER — Telehealth: Payer: Self-pay | Admitting: Gastroenterology

## 2013-11-22 NOTE — Telephone Encounter (Signed)
Spoke with patient and he is not having any problems. He is asking about f/u and with whom. Scheduled with Dr. Hilarie Fredrickson on 01/10/14 at 4:00PM.

## 2014-01-05 ENCOUNTER — Encounter: Payer: Self-pay | Admitting: Internal Medicine

## 2014-01-10 ENCOUNTER — Ambulatory Visit (INDEPENDENT_AMBULATORY_CARE_PROVIDER_SITE_OTHER): Payer: 59 | Admitting: Internal Medicine

## 2014-01-10 ENCOUNTER — Encounter: Payer: Self-pay | Admitting: Internal Medicine

## 2014-01-10 VITALS — BP 114/80 | HR 72 | Ht 69.5 in | Wt 219.5 lb

## 2014-01-10 DIAGNOSIS — K51918 Ulcerative colitis, unspecified with other complication: Secondary | ICD-10-CM

## 2014-01-10 DIAGNOSIS — K602 Anal fissure, unspecified: Secondary | ICD-10-CM

## 2014-01-10 DIAGNOSIS — K5190 Ulcerative colitis, unspecified, without complications: Secondary | ICD-10-CM

## 2014-01-10 DIAGNOSIS — M130 Polyarthritis, unspecified: Secondary | ICD-10-CM

## 2014-01-10 DIAGNOSIS — Z79899 Other long term (current) drug therapy: Secondary | ICD-10-CM

## 2014-01-10 MED ORDER — PEG-KCL-NACL-NASULF-NA ASC-C 100 G PO SOLR
1.0000 | Freq: Once | ORAL | Status: DC
Start: 2014-01-10 — End: 2014-03-14

## 2014-01-10 MED ORDER — DILTIAZEM GEL 2 %
1.0000 | Freq: Two times a day (BID) | CUTANEOUS | Status: DC
Start: 2014-01-10 — End: 2014-06-01

## 2014-01-10 NOTE — Patient Instructions (Addendum)
You have been scheduled for a colonoscopy. Please follow written instructions given to you at your visit today.  Please pick up your prep kit at the pharmacy within the next 1-3 days. If you use inhalers (even only as needed), please bring them with you on the day of your procedure. Your physician has requested that you go to www.startemmi.com and enter the access code given to you at your visit today. This web site gives a general overview about your procedure. However, you should still follow specific instructions given to you by our office regarding your preparation for the procedure.  We have sent the following medications to your pharmacy for you to pick up at your convenience: Diltiazem Gel, was sent to [facility] [place].  We have sent the following medications to your pharmacy for you to pick up at your convenience: Moviprep was sent to [facility]  Continue  Methotrexate, Anti-Rheumatic, (METHOTREXATE, PF, [place])

## 2014-01-10 NOTE — Progress Notes (Signed)
Patient ID: Christopher Smith, male   DOB: 1950-09-04, 63 y.o.   MRN: [id number] HPI: Christopher Smith is a 63 year old male with a past medical history of pan ulcerative colitis with arthropathy who is seen in followup. He was followed by Dr. Verl Blalock and this is his first visit with me. He has also been seen by Dr. Candis Shine at [facility] for a second opinion. He reports having been diagnosed with ulcerative colitis approximately 4-5 years ago. This started after taking single drug therapy for positive PPD. Since this time he has required continuous therapy but was intolerant to 5 ASA therapy. He took Remicade for sometime but developed antibodies. He also took Humira for some time. He is now taking methotrexate every week along with Cimzia 400 mg monthly. He is followed in rheumatology for his arthropathy by Dr. Patrecia Pour, who is currently in prescribing his methotrexate and biologic therapy.  He last saw Dr. Sharlett Iles in February 2014 and had a flexible sigmoidoscopy which was turned into a colonoscopy on 09/04/2012. The procedure revealed diffuse inflammation with exudate and erosion throughout the entire examined colon. The terminal ileum appeared normal for 10-15 cm. There were pseudopolyps in the rectum. Biopsies were performed. Interestingly random colon biopsies were benign with underlying granulation-like tissue. There were no adenomatous or malignant changes. The pseudopolyps were read as fragments of benign colonic mucosa with underlying granulation tissue. Hyperplastic polyps. No adenomas.  Recently he has been feeling fairly well. He is having 3-4 soft but formed bowel movements per day. He has not seen blood in his stool and reports light brown stool. No melena. His appetite has been good. No upper GI complaint. He occasionally has tenderness in the lower abdomen but this is rare now. He does have pain off and on In his anal canal with passing stool. He is not sure if this is a hemorrhoid. He  continues to have polyarthritis symptoms in the hands, shoulders and knees. No oral ulcers or new rashes. No ocular complaint. No hepatobiliary complaint. He is not using NSAIDs. Occasionally he does use Imodium  Past Medical History  Diagnosis Date  . Arthritis   . Tuberculosis     Hx of   . GERD (gastroesophageal reflux disease)   . Diarrhea   . Ulcerative colitis 01/24/2011  . Sinusitis   . Diabetes mellitus   . Sessile rectal polyp 03/27/2011  . Blood transfusion without reported diagnosis     Past Surgical History  Procedure Laterality Date  . Cholecystectomy    . Knee surgery      Bilateral   . Shoulder surgery      Bilateral     Outpatient Prescriptions Prior to Visit  Medication Sig Dispense Refill  . inFLIXimab (REMICADE) 100 MG injection Inject into the vein. Uses as directed       No facility-administered medications prior to visit.    No Known Allergies  Family History  Problem Relation Age of Onset  . Alzheimer's disease Father   . Stroke Mother   . Colon cancer Neg Hx     History  Substance Use Topics  . Smoking status: Former Smoker -- 1.00 packs/day    Quit date: 07/15/1990  . Smokeless tobacco: Never Used  . Alcohol Use: No    ROS: As per history of present illness, otherwise negative  BP 114/80  Pulse 72  Ht 5' 9.5" (1.765 m)  Wt 219 lb 8 oz (99.565 kg)  BMI 31.96 kg/m2 Constitutional: Well-developed and well-nourished.  No distress. HEENT: Normocephalic and atraumatic. Oropharynx is clear and moist. No oropharyngeal exudate. Conjunctivae are normal.  No scleral icterus. Neck: Neck supple. Trachea midline. Cardiovascular: Normal rate, regular rhythm and intact distal pulses. No M/R/G Pulmonary/chest: Effort normal and breath sounds normal. No wheezing, rales or rhonchi. Abdominal: Soft, nontender, nondistended. Bowel sounds active throughout. There are no masses palpable. No hepatosplenomegaly. Rectal: Posterior lateral anal fissure which  is exquisitely tender with rectal exam, no palpable masses or external hemorrhoids seen. No fistula seen and no fluctuance Extremities: no clubbing, cyanosis, or edema Lymphadenopathy: No cervical adenopathy noted. Neurological: Alert and oriented to person place and time. Skin: Skin is warm and dry. No rashes noted. Psychiatric: Normal mood and affect. Behavior is normal.  RELEVANT LABS AND IMAGING: CBC    Component Value Date/Time   WBC 8.6 09/03/2011 1030   RBC 4.60 09/03/2011 1030   HGB 13.2 09/03/2011 1030   HCT 40.1 09/03/2011 1030   PLT 351.0 09/03/2011 1030   MCV 87.2 09/03/2011 1030   MCHC 33.0 09/03/2011 1030   RDW 14.3 09/03/2011 1030   LYMPHSABS 1.7 09/03/2011 1030   MONOABS 0.7 09/03/2011 1030   EOSABS 0.2 09/03/2011 1030   BASOSABS 0.0 09/03/2011 1030    CMP     Component Value Date/Time   NA 139 03/01/2011 1029   K 4.4 03/01/2011 1029   CL 103 03/01/2011 1029   CO2 28 03/01/2011 1029   GLUCOSE 98 01/11/2011 1014   BUN 15 01/11/2011 1014   CREATININE 0.8 01/11/2011 1014   CALCIUM 9.0 01/11/2011 1014   PROT 7.1 01/11/2011 1014   ALBUMIN 4.0 01/11/2011 1014   AST 23 01/11/2011 1014   ALT 22 01/11/2011 1014   ALKPHOS 51 01/11/2011 1014   BILITOT 0.7 01/11/2011 1014   GFRNONAA >60 05/24/2009 0455   GFRAA  Value: >60        The eGFR has been calculated using the MDRD equation. This calculation has not been validated in all clinical situations. eGFR's persistently <60 mL/min signify possible Chronic Kidney Disease. 05/24/2009 0455    ASSESSMENT/PLAN: 63 year old male with a past medical history of pan ulcerative colitis with arthropathy who is seen in followup.  1.  Pan-UC -- the endoscopic findings at the time of his last colonoscopy in February 2014 are different than the histologic findings. I have no way of knowing the endoscopic state of his colitis without repeating the colonoscopy. I do think it would be beneficial to know the activity of his colitis in the setting of  methotrexate therapy along with the new biologic Cimzia.  He is in agreement. We will proceed to colonoscopy. We discussed the risks and benefits. --Continue methotrexate 25 mg weekly and Cimzia 400 mg every 28 days --Annual flu vaccine recommended --He reports he has had Pneumovax --Obtain labs performed recently by rheumatology, specifically blood counts, metabolic panel, M62  2.  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 I will prescribe diltiazem ointment to be used twice a day for 4-6 weeks until symptoms improve. We'll examine this area further at the time of his colonoscopy once he has been sedated  3. Polyarthritis -- continue methotrexate and cimzia under the direction of Dr. Patrecia Pour  Further recommendations after procedure

## 2014-01-13 ENCOUNTER — Encounter: Payer: Self-pay | Admitting: Internal Medicine

## 2014-03-14 ENCOUNTER — Ambulatory Visit (AMBULATORY_SURGERY_CENTER): Payer: 59 | Admitting: Internal Medicine

## 2014-03-14 ENCOUNTER — Encounter: Payer: Self-pay | Admitting: Internal Medicine

## 2014-03-14 ENCOUNTER — Other Ambulatory Visit: Payer: Self-pay

## 2014-03-14 VITALS — BP 118/69 | HR 52 | Temp 97.7°F | Resp 16 | Ht 69.0 in | Wt 209.0 lb

## 2014-03-14 DIAGNOSIS — K5190 Ulcerative colitis, unspecified, without complications: Secondary | ICD-10-CM

## 2014-03-14 DIAGNOSIS — R197 Diarrhea, unspecified: Secondary | ICD-10-CM

## 2014-03-14 DIAGNOSIS — K602 Anal fissure, unspecified: Secondary | ICD-10-CM

## 2014-03-14 DIAGNOSIS — D126 Benign neoplasm of colon, unspecified: Secondary | ICD-10-CM

## 2014-03-14 MED ORDER — SODIUM CHLORIDE 0.9 % IV SOLN: 500.0000 mL | INTRAVENOUS | Status: DC

## 2014-03-14 NOTE — Op Note (Signed)
[place]  [address] [place], [id number]   COLONOSCOPY PROCEDURE REPORT  PATIENT: Smith, Christopher  MR#: [id number] BIRTHDATE: [birth year] , 44  yrs. old GENDER: Male ENDOSCOPIST: Jerene Bears, MD PROCEDURE DATE:  03/14/2014 PROCEDURE:   Colonoscopy with biopsy and Colonoscopy with snare polypectomy First Screening Colonoscopy - Avg.  risk and is 50 yrs.  old or older - No.  Prior Negative Screening - Now for repeat screening. N/A  History of Adenoma - Now for follow-up colonoscopy & has been > or = to 3 yrs.  N/A  Polyps Removed Today? Yes. ASA CLASS:   Class III INDICATIONS:follow up for previously diagnosed pancolitis ulcerative colitis. MEDICATIONS: MAC sedation, administered by CRNA and propofol (Diprivan) 313m IV  DESCRIPTION OF PROCEDURE:   After the risks benefits and alternatives of the procedure were thoroughly explained, informed consent was obtained.  A digital rectal exam revealed an anal fissure.   The LB [id number] endoscope was introduced through the anus and advanced to the terminal ileum which was intubated for a short distance. No adverse events experienced. The quality of the prep was good, using MoviPrep  The instrument was then slowly withdrawn as the colon was fully examined.   COLON FINDINGS: Patchy colitis, consistent with ulcerative colitis was found throughout the colon. There was one discrete patch in the cecum which was biopsied separately and placed in jar one. The colitis was very mild throughout the ascending and transverse colon with patchy ulceration and erythema along with granularity. Biopsies were performed from the segment and placed in jar 2. Again colitis patchy with slightly more ulceration in the sigmoid and rectosigmoid colon. Biopsies performed in the left colon and rectum and placed in jar 4.  A possible flat polyp measuring 10 mm in size was found in the ascending colon.  A polypectomy was performed  with a cold snare.  The resection was complete and the polyp tissue was completely retrieved.  Retroflexed views revealed internal hemorrhoids. The time to cecum=2 minutes 34 seconds.  Withdrawal time=16 minutes 18 seconds.  The scope was withdrawn and the procedure completed.  COMPLICATIONS: There were no complications.   ENDOSCOPIC IMPRESSION: 1.   Patchy mild colitis, most significant in the distal sigmoid and rectosigmoid colon; multiple biopsies were performed using cold forceps 2.   Flat polyp measuring 10 mm in size was found in the ascending colon; polypectomy was performed with a cold snare 3.   Posterior anal fissure  RECOMMENDATIONS: 1.  Avoid all NSAIDS for the next 2 weeks. 2.  Await biopsy results 3.  Surgical referral for chronic anal fissure 4.  Continue MTX and Cimzia therapy 5.  Office follow-up as previously recommended   eSigned:  JJerene Bears MD 03/14/2014 8:44 AM   cc: The Patient, BThressa Sheller MD, and ERico Sheehan MD   PATIENT NAME:  Christopher Smith, HarrelMR#: [id number]

## 2014-03-14 NOTE — Progress Notes (Signed)
A/ox3 pleased with MAC, report to [facility]

## 2014-03-14 NOTE — Patient Instructions (Signed)
Colitis, biopsies taken, polyp x 1 removed and anal fissure seen today. Handouts given.  Avoid all NSAIDS for the next 2 weeks. Continue current medications. Follow up with Dr.Pyrtle. Surgical referral. Call us with any questions or concerns. Thank you!   YOU HAD AN ENDOSCOPIC PROCEDURE TODAY AT [facility]: Refer to the procedure report that was given to you for any specific questions about what was found during the examination.  If the procedure report does not answer your questions, please call your gastroenterologist to clarify.  If you requested that your care partner not be given the details of your procedure findings, then the procedure report has been included in a sealed envelope for you to review at your convenience later.  YOU SHOULD EXPECT: Some feelings of bloating in the abdomen. Passage of more gas than usual.  Walking can help get rid of the air that was put into your GI tract during the procedure and reduce the bloating. If you had a lower endoscopy (such as a colonoscopy or flexible sigmoidoscopy) you may notice spotting of blood in your stool or on the toilet paper. If you underwent a bowel prep for your procedure, then you may not have a normal bowel movement for a few days.  DIET: Your first meal following the procedure should be a light meal and then it is ok to progress to your normal diet.  A half-sandwich or bowl of soup is an example of a good first meal.  Heavy or fried foods are harder to digest and may make you feel nauseous or bloated.  Likewise meals heavy in dairy and vegetables can cause extra gas to form and this can also increase the bloating.  Drink plenty of fluids but you should avoid alcoholic beverages for 24 hours.  ACTIVITY: Your care partner should take you home directly after the procedure.  You should plan to take it easy, moving slowly for the rest of the day.  You can resume normal activity the day after the procedure however you should NOT  DRIVE or use heavy machinery for 24 hours (because of the sedation medicines used during the test).    SYMPTOMS TO REPORT IMMEDIATELY: A gastroenterologist can be reached at any hour.  During normal business hours, 8:30 AM to 5:00 PM Monday through Friday, call [phone].  After hours and on weekends, please call the GI answering service at [phone] who will take a message and have the physician on call contact you.   Following lower endoscopy (colonoscopy or flexible sigmoidoscopy):  Excessive amounts of blood in the stool  Significant tenderness or worsening of abdominal pains  Swelling of the abdomen that is new, acute  Fever of 100F or higher  Following upper endoscopy (EGD)  Vomiting of blood or coffee ground material  New chest pain or pain under the shoulder blades  Painful or persistently difficult swallowing  New shortness of breath  Fever of 100F or higher  Black, tarry-looking stools  FOLLOW UP: If any biopsies were taken you will be contacted by phone or by letter within the next 1-3 weeks.  Call your gastroenterologist if you have not heard about the biopsies in 3 weeks.  Our staff will call the home number listed on your records the next business day following your procedure to check on you and address any questions or concerns that you may have at that time regarding the information given to you following your procedure. This is a courtesy call and  so if there is no answer at the home number and we have not heard from you through the emergency physician on call, we will assume that you have returned to your regular daily activities without incident.  SIGNATURES/CONFIDENTIALITY: You and/or your care partner have signed paperwork which will be entered into your electronic medical record.  These signatures attest to the fact that that the information above on your After Visit Summary has been reviewed and is understood.  Full responsibility of the confidentiality of  this discharge information lies with you and/or your care-partner.

## 2014-03-14 NOTE — Progress Notes (Signed)
Called to room to assist during endoscopic procedure.  Patient ID and intended procedure confirmed with present staff. Received instructions for my participation in the procedure from the performing physician.  

## 2014-03-15 ENCOUNTER — Telehealth: Payer: Self-pay

## 2014-03-15 ENCOUNTER — Telehealth: Payer: Self-pay | Admitting: *Deleted

## 2014-03-15 NOTE — Telephone Encounter (Signed)
Pt scheduled to see Dr. Marcello Moores 04/07/14@11 :20am. Pt aware of appt.

## 2014-03-15 NOTE — Telephone Encounter (Signed)
  Follow up Call-  Call back number 03/14/2014 09/04/2012  Post procedure Call Back phone  # [phone]  Permission to leave phone message Yes Yes     Patient questions:  Do you have a fever, pain , or abdominal swelling? No. Pain Score  0 *  Have you tolerated food without any problems? Yes.    Have you been able to return to your normal activities? Yes.    Do you have any questions about your discharge instructions: Diet   No. Medications  No. Follow up visit  Yes.    Do you have questions or concerns about your Care? No.  Actions: * If pain score is 4 or above: No action needed, pain <4.  Pt. Wanted to know about follow up, doctor note instructed him to follow up as previously recommended, instructed pt. To call to see if had follow appt. Scheduled.

## 2014-03-22 ENCOUNTER — Encounter: Payer: Self-pay | Admitting: Internal Medicine

## 2014-03-23 ENCOUNTER — Telehealth: Payer: Self-pay | Admitting: Internal Medicine

## 2014-03-23 NOTE — Telephone Encounter (Signed)
Spoke with pt and he is aware. Pt scheduled to see Dr. Hilarie Fredrickson 06/01/14@11am . Pt aware of appt.

## 2014-03-23 NOTE — Telephone Encounter (Signed)
His biopsy showed chronic active colitis, which means his laboratory values he is remains active One polyp removed was a tubular adenoma, which is a precancerous polyp, but there was no evidence of cancer I do think he should follow up in the given his disease remains active, next available is okay

## 2014-03-23 NOTE — Telephone Encounter (Signed)
Pt is calling for path results, please advise. No letter found in epic. Pt also wants to know if he needs an OV? Please advise. Procedure report states follow-up as recommended.

## 2014-03-25 ENCOUNTER — Encounter: Payer: Self-pay | Admitting: *Deleted

## 2014-03-25 ENCOUNTER — Encounter: Payer: Self-pay | Admitting: Internal Medicine

## 2014-05-25 ENCOUNTER — Encounter: Payer: Self-pay | Admitting: *Deleted

## 2014-06-01 ENCOUNTER — Encounter: Payer: Self-pay | Admitting: Internal Medicine

## 2014-06-01 ENCOUNTER — Ambulatory Visit (INDEPENDENT_AMBULATORY_CARE_PROVIDER_SITE_OTHER): Payer: 59 | Admitting: Internal Medicine

## 2014-06-01 VITALS — BP 136/78 | HR 64 | Ht 69.5 in | Wt 220.1 lb

## 2014-06-01 DIAGNOSIS — K5190 Ulcerative colitis, unspecified, without complications: Secondary | ICD-10-CM

## 2014-06-01 DIAGNOSIS — Z79899 Other long term (current) drug therapy: Secondary | ICD-10-CM

## 2014-06-01 DIAGNOSIS — K602 Anal fissure, unspecified: Secondary | ICD-10-CM

## 2014-06-01 DIAGNOSIS — D126 Benign neoplasm of colon, unspecified: Secondary | ICD-10-CM

## 2014-06-01 MED ORDER — BALSALAZIDE DISODIUM 1.1 G PO TABS: 3.0000 | ORAL_TABLET | Freq: Two times a day (BID) | ORAL | Status: DC

## 2014-06-01 NOTE — Progress Notes (Signed)
Subjective:    Patient ID: Christopher Smith, male    DOB: [birth year], 63 y.o.   MRN: [id number]  HPI Christopher Smith is a 63 year old male with a past medical history of pan ulcer colitis with arthropathy who is seen in follow-up. He is diagnosed with panel strict colitis a proximally 5 years ago. Symptoms started after taking single drug therapy for a positive PPD. He has been treated in the past with Remicade, Humira, and is now treated with Cimzia and methotrexate.  He is also followed by rheumatology. He had a colonoscopy which was performed on 03/14/2014. This revealed patchy colitis throughout the colon. The colitis was mild throughout the ascending and transverse colon continuing into the sigmoid and rectosigmoid. Biopsies were performed. A 10 mm flat ascending colon polyp was removed by snare. This was a tubular adenoma. Random biopsies showed chronic active colitis without dysplasia. He returns today for follow-up.  He reports he has been feeling well. He is having 3-4 soft but formed stools daily. He denies blood in his stool or melena. He reports good appetite without weight loss. No epigastric or other abdominal pain. He did have an anal fissure which was treated initially with diltiazem but was then seen by surgery and switch to nitroglycerin. He had much better response to nitroglycerin and he reports his anal fissure healed. He reports he tends to have less loose stools when he has beer at night. He drinks 1-2 beers on most nights.   Review of Systems As per history of present illness, otherwise negative  Current Medications, Allergies, Past Medical History, Past Surgical History, Family History and Social History were reviewed in [employer] record.     Objective:   Physical Exam BP 136/78 mmHg  Pulse 64  Ht 5' 9.5" (1.765 m)  Wt 220 lb 2 oz (99.848 kg)  BMI 32.05 kg/m2 Constitutional: Well-developed and well-nourished. No distress. HEENT:  Normocephalic and atraumatic. Oropharynx is clear and moist. No oropharyngeal exudate. Conjunctivae are normal.  No scleral icterus. Neck: Neck supple. Trachea midline. Cardiovascular: Normal rate, regular rhythm and intact distal pulses. No M/R/G Pulmonary/chest: Effort normal and breath sounds normal. No wheezing, rales or rhonchi. Abdominal: Soft, nontender, nondistended. Bowel sounds active throughout. There are no masses palpable. No hepatosplenomegaly. Extremities: no clubbing, cyanosis, or edema Neurological: Alert and oriented to person place and time. Skin: Skin is warm and dry.  Psychiatric: Normal mood and affect. Behavior is normal.  Labs reviewed from rheumatology clinic dated 05/30/2014. CMP within normal limits. CBC within normal limits. Hemoglobin 14.9, WBC 5.9, MCV 94.3, platelet count 250.      Assessment & Plan:   63 year old male with a past medical history of pan ulcer colitis with arthropathy who is seen in follow-up.  1. Pan-UC with mild activity at recent colonoscopy -- likely he is doing quite well though endoscopically he had mild colitis. We discussed addition of a 5 ASA to try to help promote further you coastal healing. He did take sulfasalazine and Lialda in the past but this was when his colitis was extremely active. It did not help at that time. He does not recall any severe reaction or allergy to these medications. I recommended a trial of balsalazide. Giazo, 3.3g BID.  Hopefully this will help promote further mucosal healing. He will continue Cimzia and MTX therapy at current dose.  Labs up-to-date performed this week. He reports previous flu shot.  2. Anal fissure -- healed  3.  Adenomatous  colon polyp -- removed by snare. In the setting of pancolitis, I have recommended repeating colonoscopy in 2 years.  Return in 6 months, sooner if necessary

## 2014-06-01 NOTE — Patient Instructions (Addendum)
We have sent the following medications to your pharmacy for you to pick up at your convenience: Roderic Scarce 3 tablets twice daily  Please continue Cimzia and methotrexate.  Please follow up with Dr Hilarie Fredrickson in 6 months.  We have requested recent labwork from Dr Arlean Hopping office.  CC:Dr Brain Noah Delaine

## 2014-06-03 ENCOUNTER — Telehealth: Payer: Self-pay | Admitting: *Deleted

## 2014-06-03 MED ORDER — BALSALAZIDE DISODIUM 750 MG PO CAPS: 2250.0000 mg | ORAL_CAPSULE | Freq: Three times a day (TID) | ORAL | Status: DC

## 2014-06-03 NOTE — Telephone Encounter (Signed)
Dr Hilarie FredricksonRoderic Scarce prescribed yesterday is not covered under patient's insurance plan and they will not accept a prior authorization. Patient has tried Lialda, Rowasa, Canasa, Prednisone, Sulfasalazine and Methrotrexate in the past. Please advise.Marland KitchenMarland KitchenMarland Kitchen

## 2014-06-03 NOTE — Telephone Encounter (Signed)
New rx sent. Left voicemail for patient explaining situation.

## 2014-06-03 NOTE — Telephone Encounter (Signed)
colazal 2.25g TID

## 2014-10-05 ENCOUNTER — Ambulatory Visit (HOSPITAL_COMMUNITY)
Admission: RE | Admit: 2014-10-05 | Discharge: 2014-10-05 | Disposition: A | Discharge status: Home / Self Care | Payer: 59 | Source: Ambulatory Visit | Attending: Rheumatology | Admitting: Rheumatology

## 2014-10-05 ENCOUNTER — Other Ambulatory Visit (HOSPITAL_COMMUNITY): Payer: Self-pay | Admitting: Rheumatology

## 2014-10-05 DIAGNOSIS — Z0000 Encounter for general adult medical examination without abnormal findings: Secondary | ICD-10-CM | POA: Diagnosis not present

## 2014-10-05 DIAGNOSIS — Z227 Latent tuberculosis: Secondary | ICD-10-CM

## 2014-11-21 ENCOUNTER — Encounter: Payer: Self-pay | Admitting: Internal Medicine

## 2015-01-09 ENCOUNTER — Ambulatory Visit: Payer: 59 | Admitting: Internal Medicine

## 2015-02-27 ENCOUNTER — Encounter: Payer: Self-pay | Admitting: Internal Medicine

## 2015-02-27 ENCOUNTER — Ambulatory Visit (INDEPENDENT_AMBULATORY_CARE_PROVIDER_SITE_OTHER): Payer: 59 | Admitting: Internal Medicine

## 2015-02-27 VITALS — BP 124/68 | HR 61 | Ht 69.5 in | Wt 222.8 lb

## 2015-02-27 DIAGNOSIS — K5190 Ulcerative colitis, unspecified, without complications: Secondary | ICD-10-CM

## 2015-02-27 DIAGNOSIS — Z79899 Other long term (current) drug therapy: Secondary | ICD-10-CM

## 2015-02-27 DIAGNOSIS — T753XXA Motion sickness, initial encounter: Secondary | ICD-10-CM

## 2015-02-27 MED ORDER — SCOPOLAMINE 1 MG/3DAYS TD PT72: MEDICATED_PATCH | TRANSDERMAL | Status: DC

## 2015-02-27 NOTE — Progress Notes (Signed)
   Subjective:    Patient ID: Christopher Smith, male    DOB: 04-24-1951, 64 y.o.   MRN: [id number]  HPI Christopher Smith is a 64 year old male with past medical history of pan ulcerative colitis with arthropathy who seen in follow-up. Diagnosis approximately 2010. He has been maintained on Cimzia and methotrexate. He sees Dr. Estanislado Pandy with rheumatology as well. After his last visit in November 2015 he was started on balsalazide which she tried for 3 weeks but had significant diarrhea. Diarrhea resolved with discontinuation of the medication. Colitis symptoms have remained in check he denies diarrhea, abdominal pain, blood in stool. He is having one to 2 bowel movements per day sometimes as many as 3 per day which are soft but formed. Joint symptoms under good control. Appetite good, no nausea or vomiting. No hepatobiliary complaint. No new rashes or ocular complaints.   Last colonoscopy performed 03/14/2014. Revealed patchy colitis throughout the colon. 10 mm ascending colon tubular adenoma removed. Random biopsies chronic active colitis without dysplasia.  Review of Systems As per history of present illness, otherwise negative  Current Medications, Allergies, Past Medical History, Past Surgical History, Family History and Social History were reviewed in [employer] record.     Objective:   Physical Exam BP 124/68 mmHg  Pulse 61  Ht 5' 9.5" (1.765 m)  Wt 222 lb 12.8 oz (101.061 kg)  BMI 32.44 kg/m2 Constitutional: Well-developed and well-nourished. No distress. HEENT: Normocephalic and atraumatic. Oropharynx is clear and moist. No oropharyngeal exudate. Conjunctivae are normal.  No scleral icterus. Neck: Neck supple. Trachea midline. Cardiovascular: Normal rate, regular rhythm and intact distal pulses. No M/R/G Pulmonary/chest: Effort normal and breath sounds normal. No wheezing, rales or rhonchi. Abdominal: Soft, nontender, nondistended. Bowel sounds active throughout.  There are no masses palpable. No hepatosplenomegaly. Extremities: no clubbing, cyanosis, or edema Lymphadenopathy: No cervical adenopathy noted. Neurological: Alert and oriented to person place and time. Skin: Skin is warm and dry. No rashes noted. Psychiatric: Normal mood and affect. Behavior is normal.  Labs reviewed from rheumatology clinic 09/27/2014 -- comments of metabolic panel within normal limits, CBC within normal limits, quantiferon Gold negative    Assessment & Plan:  64 year old male with past medical history of pan ulcerative colitis with arthropathy who seen in follow-up.  1. Pan UC -- clinically in remission though mild colonic activity by histology in August 2015. He had paradoxical diarrhea with balsalazide. For this reason will defer further 5-ASA at this time. Continue Cimzia 400 mg every 4 weeks and methotrexate at current dose. Repeat colonoscopy recommended in August 2017. Return in 9 months, sooner if necessary. Call with any evidence of flare  2. Arthropathy -- controlled on Cimzia and methotrexate per rheumatology. Labs up-to-date  3. History of adenomatous colon polyp -- removed by snare 1 year ago. Repeat colonoscopy next summer as discussed above  4. Motion sickness -- up coming deep sea fishing trip for 1 day. Scopolamine patch 1.5 mg applied behind-the-ear greater than 4 hours before leaving for fishing trip. Can be worn up to 72 hours but I recommended removing after fishing.  25 min spent with pt

## 2015-02-27 NOTE — Patient Instructions (Signed)
Continue Methotrexate and Cimzia.  You will be due for a recall colonoscopy in 01/2016. We will send you a reminder in the mail when it gets closer to that time.  We have sent the following medications to your pharmacy for you to pick up at your convenience: scopalomine  Please follow up in 9 months.

## 2015-06-16 ENCOUNTER — Encounter: Payer: Self-pay | Admitting: Gastroenterology

## 2015-09-06 ENCOUNTER — Other Ambulatory Visit (HOSPITAL_COMMUNITY): Payer: Self-pay | Admitting: Interventional Radiology

## 2015-09-06 ENCOUNTER — Ambulatory Visit (HOSPITAL_COMMUNITY)
Admission: RE | Admit: 2015-09-06 | Discharge: 2015-09-06 | Disposition: A | Discharge status: Home / Self Care | Payer: 59 | Source: Ambulatory Visit | Attending: Rheumatology | Admitting: Rheumatology

## 2015-09-06 ENCOUNTER — Other Ambulatory Visit (HOSPITAL_COMMUNITY): Payer: Self-pay | Admitting: Rheumatology

## 2015-09-06 DIAGNOSIS — Z9225 Personal history of immunosupression therapy: Secondary | ICD-10-CM | POA: Diagnosis not present

## 2015-09-06 DIAGNOSIS — A159 Respiratory tuberculosis unspecified: Secondary | ICD-10-CM

## 2015-11-13 ENCOUNTER — Encounter: Payer: Self-pay | Admitting: Internal Medicine

## 2015-12-04 ENCOUNTER — Encounter: Payer: Self-pay | Admitting: Internal Medicine

## 2016-01-03 ENCOUNTER — Telehealth: Payer: Self-pay | Admitting: Internal Medicine

## 2016-01-03 NOTE — Telephone Encounter (Signed)
I have reviewed my note and did not mention EGD, but that does not mean I did not mention it to him I also do not see documentation of GERD  If he has had long-standing GERD or heartburn, or if he is having issues with swallowing, or epigastric pain that would warrant investigation, then EGD can be added Otherwise, EGD is not a screening or surveillance exam that is recommended routinely

## 2016-01-03 NOTE — Telephone Encounter (Signed)
Pt states Dr. Hilarie Fredrickson wanted him to have an egd along with his colon recall. He is only scheduled for colon right now, do you want him to have an EGD also? Please advise.

## 2016-01-03 NOTE — Telephone Encounter (Signed)
Left message for pt to call back.  Pt states he is having issues with heartburn. Offered to reschedule his appt but a double slot is not available until the afternoon. Pt does not want an afternoon appt. States he will leave it as scheduled and perhaps set up egd at another date.

## 2016-01-18 ENCOUNTER — Ambulatory Visit (AMBULATORY_SURGERY_CENTER): Payer: Self-pay

## 2016-01-18 VITALS — Ht 71.0 in | Wt 221.2 lb

## 2016-01-18 DIAGNOSIS — K5190 Ulcerative colitis, unspecified, without complications: Secondary | ICD-10-CM

## 2016-01-18 MED ORDER — SUPREP BOWEL PREP KIT 17.5-3.13-1.6 GM/177ML PO SOLN: 1.0000 | Freq: Once | ORAL | Status: DC

## 2016-01-18 NOTE — Progress Notes (Signed)
No allergies to eggs or soy No past problems with anesthesia except PONV with general No home oxygen No diet meds  Has email and internet; declined emmi

## 2016-02-01 ENCOUNTER — Encounter: Payer: Self-pay | Admitting: Internal Medicine

## 2016-02-01 ENCOUNTER — Ambulatory Visit (AMBULATORY_SURGERY_CENTER): Payer: 59 | Admitting: Internal Medicine

## 2016-02-01 VITALS — BP 105/49 | HR 56 | Temp 98.2°F | Resp 16 | Ht 71.0 in | Wt 221.0 lb

## 2016-02-01 DIAGNOSIS — K621 Rectal polyp: Secondary | ICD-10-CM

## 2016-02-01 DIAGNOSIS — D128 Benign neoplasm of rectum: Secondary | ICD-10-CM

## 2016-02-01 DIAGNOSIS — D124 Benign neoplasm of descending colon: Secondary | ICD-10-CM

## 2016-02-01 DIAGNOSIS — K5190 Ulcerative colitis, unspecified, without complications: Secondary | ICD-10-CM | POA: Diagnosis not present

## 2016-02-01 DIAGNOSIS — K5150 Left sided colitis without complications: Secondary | ICD-10-CM | POA: Diagnosis not present

## 2016-02-01 DIAGNOSIS — D129 Benign neoplasm of anus and anal canal: Secondary | ICD-10-CM

## 2016-02-01 MED ORDER — PANTOPRAZOLE SODIUM 40 MG PO TBEC: 40.0000 mg | DELAYED_RELEASE_TABLET | Freq: Every day | ORAL | Status: DC

## 2016-02-01 MED ORDER — SODIUM CHLORIDE 0.9 % IV SOLN: 500.0000 mL | INTRAVENOUS | Status: DC

## 2016-02-01 NOTE — Progress Notes (Signed)
Report to PACU, RN, vss, BBS= Clear.  

## 2016-02-01 NOTE — Progress Notes (Signed)
Called to room to assist during endoscopic procedure.  Patient ID and intended procedure confirmed with present staff. Received instructions for my participation in the procedure from the performing physician.  

## 2016-02-01 NOTE — Op Note (Signed)
[place] Patient Name: Christopher Smith Procedure Date: 02/01/2016 9:07 AM MRN: [id number] Endoscopist: Jerene Bears , MD Age: 65 Referring MD:  Date of Birth: 1951/02/27 Gender: Male Account #: [id number] Procedure:                Colonoscopy Indications:              High risk colon cancer surveillance: Ulcerative                            pancolitis of 8 (or more) years duration, Last                            colonoscopy: August 2015 Medicines:                Monitored Anesthesia Care Procedure:                Pre-Anesthesia Assessment:                           - Prior to the procedure, a History and Physical                            was performed, and patient medications and                            allergies were reviewed. The patient's tolerance of                            previous anesthesia was also reviewed. The risks                            and benefits of the procedure and the sedation                            options and risks were discussed with the patient.                            All questions were answered, and informed consent                            was obtained. Prior Anticoagulants: The patient has                            taken no previous anticoagulant or antiplatelet                            agents. ASA Grade Assessment: II - A patient with                            mild systemic disease. After reviewing the risks                            and benefits, the patient was deemed in  satisfactory condition to undergo the procedure.                           After obtaining informed consent, the colonoscope                            was passed under direct vision. Throughout the                            procedure, the patient's blood pressure, pulse, and                            oxygen saturations were monitored continuously. The                            Model CF-HQ190L [id number]) scope was  introduced                            through the anus and advanced to the the terminal                            ileum. The colonoscopy was performed without                            difficulty. The patient tolerated the procedure                            well. The quality of the bowel preparation was                            good. The terminal ileum, ileocecal valve,                            appendiceal orifice, and rectum were photographed. Scope In: 9:20:45 AM Scope Out: 9:43:48 AM Scope Withdrawal Time: 0 hours 21 minutes 17 seconds  Total Procedure Duration: 0 hours 23 minutes 3 seconds  Findings:                 The terminal ileum appeared normal.                           The digital rectal exam was normal.                           Patchy mild inflammation characterized by                            congestion (edema), erosions, erythema,                            granularity, loss of vascularity and shallow                            ulcerations was found in the rectum, in the  recto-sigmoid colon and in the distal sigmoid                            colon. Four biopsies were taken every 10 cm with a                            cold forceps from the right colon, left colon and                            rectum for dysplasia surveillance and ulcerative                            colitis surveillance. The remaining colonic mucosa                            appeared normal without evidence of active                            inflammation. These biopsy specimens from the right                            colon, left colon and rectum were sent to Pathology.                           A 8 mm polyp was found in the descending colon. The                            polyp was sessile. The polyp was removed with a hot                            snare. Resection and retrieval were complete. The                            edges were biopsied with a cold  forceps for                            histology to rule out dysplasia.                           A 4 mm polyp was found in the rectum. The polyp was                            sessile. The polyp was removed with a hot snare.                            Resection and retrieval were complete. Coagulation                            for tissue destruction at polypectomy edges using                            hot snare was successful.  Internal hemorrhoids were found during                            retroflexion. The hemorrhoids were small. Complications:            No immediate complications. Estimated Blood Loss:     Estimated blood loss was minimal. Impression:               - The examined portion of the ileum was normal.                           - Patchy mild inflammation was found in the rectum,                            in the recto-sigmoid colon and in the distal                            sigmoid colon secondary to ulcerative colitis.                            Biopsied.                           - One 8 mm polyp in the descending colon, removed                            with a hot snare. Resected and retrieved. Biopsied.                           - One 4 mm polyp in the rectum, removed with a hot                            snare. Resected and retrieved. Treated with a hot                            snare.                           - Internal hemorrhoids. Recommendation:           - Patient has a contact number available for                            emergencies. The signs and symptoms of potential                            delayed complications were discussed with the                            patient. Return to normal activities tomorrow.                            Written discharge instructions were provided to the  patient.                           - Resume previous diet.                           - Continue present  medications.                           - Await pathology results.                           - Repeat colonoscopy is recommended for                            surveillance. The colonoscopy date will be                            determined after pathology results from today's                            exam become available for review.                           - Use Protonix (pantoprazole) 40 mg PO daily.                           - Return to my office at the next available                            appointment. Jerene Bears, MD 02/01/2016 9:58:15 AM This report has been signed electronically.

## 2016-02-01 NOTE — Patient Instructions (Signed)
YOU HAD AN ENDOSCOPIC PROCEDURE TODAY AT [facility]:   Refer to the procedure report that was given to you for any specific questions about what was found during the examination.  If the procedure report does not answer your questions, please call your gastroenterologist to clarify.  If you requested that your care partner not be given the details of your procedure findings, then the procedure report has been included in a sealed envelope for you to review at your convenience later.  YOU SHOULD EXPECT: Some feelings of bloating in the abdomen. Passage of more gas than usual.  Walking can help get rid of the air that was put into your GI tract during the procedure and reduce the bloating. If you had a lower endoscopy (such as a colonoscopy or flexible sigmoidoscopy) you may notice spotting of blood in your stool or on the toilet paper. If you underwent a bowel prep for your procedure, you may not have a normal bowel movement for a few days.  Please Note:  You might notice some irritation and congestion in your nose or some drainage.  This is from the oxygen used during your procedure.  There is no need for concern and it should clear up in a day or so.  SYMPTOMS TO REPORT IMMEDIATELY:   Following lower endoscopy (colonoscopy or flexible sigmoidoscopy):  Excessive amounts of blood in the stool  Significant tenderness or worsening of abdominal pains  Swelling of the abdomen that is new, acute  Fever of 100F or higher  For urgent or emergent issues, a gastroenterologist can be reached at any hour by calling [phone].   DIET: Your first meal following the procedure should be a small meal and then it is ok to progress to your normal diet. Heavy or fried foods are harder to digest and may make you feel nauseous or bloated.  Likewise, meals heavy in dairy and vegetables can increase bloating.  Drink plenty of fluids but you should avoid alcoholic beverages for 24  hours.  ACTIVITY:  You should plan to take it easy for the rest of today and you should NOT DRIVE or use heavy machinery until tomorrow (because of the sedation medicines used during the test).    FOLLOW UP: Our staff will call the number listed on your records the next business day following your procedure to check on you and address any questions or concerns that you may have regarding the information given to you following your procedure. If we do not reach you, we will leave a message.  However, if you are feeling well and you are not experiencing any problems, there is no need to return our call.  We will assume that you have returned to your regular daily activities without incident.  If any biopsies were taken you will be contacted by phone or by letter within the next 1-3 weeks.  Please call us at [phone] if you have not heard about the biopsies in 3 weeks.    SIGNATURES/CONFIDENTIALITY: You and/or your care partner have signed paperwork which will be entered into your electronic medical record.  These signatures attest to the fact that that the information above on your After Visit Summary has been reviewed and is understood.  Full responsibility of the confidentiality of this discharge information lies with you and/or your care-partner.  Please read polyp handout provided. Protonix 40 mg daily.

## 2016-02-02 ENCOUNTER — Telehealth: Payer: Self-pay | Admitting: *Deleted

## 2016-02-02 NOTE — Telephone Encounter (Signed)
  Follow up Call-  Call back number 02/01/2016 03/14/2014  Post procedure Call Back phone  # [phone]  Permission to leave phone message Yes Yes     Patient questions:  Message left to call us if necessary.

## 2016-02-07 ENCOUNTER — Encounter: Payer: Self-pay | Admitting: Internal Medicine

## 2016-03-26 ENCOUNTER — Encounter: Payer: Self-pay | Admitting: *Deleted

## 2016-04-15 ENCOUNTER — Ambulatory Visit: Payer: 59 | Admitting: Internal Medicine

## 2016-04-24 ENCOUNTER — Ambulatory Visit (INDEPENDENT_AMBULATORY_CARE_PROVIDER_SITE_OTHER): Payer: 59 | Admitting: Internal Medicine

## 2016-04-24 ENCOUNTER — Encounter: Payer: Self-pay | Admitting: Internal Medicine

## 2016-04-24 VITALS — BP 132/86 | HR 68 | Ht 69.5 in | Wt 226.8 lb

## 2016-04-24 DIAGNOSIS — K5100 Ulcerative (chronic) pancolitis without complications: Secondary | ICD-10-CM | POA: Diagnosis not present

## 2016-04-24 DIAGNOSIS — B354 Tinea corporis: Secondary | ICD-10-CM | POA: Diagnosis not present

## 2016-04-24 DIAGNOSIS — K219 Gastro-esophageal reflux disease without esophagitis: Secondary | ICD-10-CM

## 2016-04-24 DIAGNOSIS — K5190 Ulcerative colitis, unspecified, without complications: Secondary | ICD-10-CM | POA: Diagnosis not present

## 2016-04-24 MED ORDER — PANTOPRAZOLE SODIUM 40 MG PO TBEC: 40.0000 mg | DELAYED_RELEASE_TABLET | Freq: Every day | ORAL | 1 refills | Status: DC

## 2016-04-24 MED ORDER — CLOTRIMAZOLE-BETAMETHASONE 1-0.05 % EX CREA: 1.0000 "application " | TOPICAL_CREAM | Freq: Two times a day (BID) | CUTANEOUS | 0 refills | Status: DC

## 2016-04-24 NOTE — Patient Instructions (Addendum)
We have sent the following medications to your pharmacy for you to pick up at your convenience: Pantoprazole 40 mg daily clotramizole 1% cream-apply twice daily to groin rash  You have been scheduled for an endoscopy. Please follow written instructions given to you at your visit today. If you use inhalers (even only as needed), please bring them with you on the day of your procedure. Your physician has requested that you go to www.startemmi.com and enter the access code given to you at your visit today. This web site gives a general overview about your procedure. However, you should still follow specific instructions given to you by our office regarding your preparation for the procedure.  You have been scheduled to see Dr Elvera Lennox at [facility] on Wednesday, 06/19/16 @ 2 pm. Eartha Inch at 1:45 pm for registration:  [address], [place], [place] [address] Phone [phone]       Continue cimzia and methotrexate per Dr Estanislado Pandy.  If you are age 87 or older, your body mass index should be between 23-30. Your Body mass index is 33.01 kg/m. If this is out of the aforementioned range listed, please consider follow up with your Primary Care Provider.  If you are age 19 or younger, your body mass index should be between 19-25. Your Body mass index is 33.01 kg/m. If this is out of the aformentioned range listed, please consider follow up with your Primary Care Provider.

## 2016-04-24 NOTE — Progress Notes (Signed)
Subjective:    Patient ID: Christopher Smith, male    DOB: 14-May-1951, 65 y.o.   MRN: [id number]  HPI Christopher Smith is a 65 year old male with a past medical history of pan ulcerative colitis with arthropathy, adenomatous colon polyps and GERD who seen for follow-up. He is here alone today. He was last seen at the time of his colonoscopy which was performed for surveillance on 02/01/2016. This showed normal terminal ileum. Patchy mild inflammation in the distal sigmoid, rectosigmoid and rectum. This was biopsied. An 57m descending polyp was removed with hot snare and a 4 mm distal rectal polyp was removed with hot snare. Internal hemorrhoids were seen. The descending polyp was a tubular adenoma without high-grade dysplasia. The rectal polyp was inflammatory/pseudopolyp. Biopsies from the right colon were benign without IBD or dysplasia. Left colon biopsies showed acute and chronic inflammation without dysplasia or malignancy. Rectal biopsies were benign. Biopsies were also obtained around the descending polyp after it was removed and also benign without adenomatous change or dysplasia. He has been maintained on Cimzia 400 mg every 28 days and methotrexate weekly.  He reports his bowel movements as regular without blood or melena. He's having 1-2 formed bowel movements a day which are soft and nonbloody. He denies melena. Joint symptoms have been under good control.  At the time of his endoscopy he reported having issues with ongoing indigestion. Today he describes that his lifelong. He states he always had Rolaids with him. He describes regurgitation without dysphagia or odynophagia. We started pantoprazole 40 mg daily after his colonoscopy and he reports this has worked extremely well. He has developed a rash in his bilateral groin and inguinal region which he states as simply visible but not symptomatic. He denies pruritic component or painful component. He stopped protonic to see if this rash was  related. Heartburn and indigestion returned and the rash did not improve. He resumed pantoprazole 40 mg daily.  Review of Systems As per history of present illness, otherwise negative  Current Medications, Allergies, Past Medical History, Past Surgical History, Family History and Social History were reviewed in [employer].     Objective:   Physical Exam BP 132/86   Pulse 68   Ht 5' 9.5" (1.765 m)   Wt 226 lb 12.8 oz (102.9 kg)   SpO2 96%   BMI 33.01 kg/m  Constitutional: Well-developed and well-nourished. No distress. HEENT: Normocephalic and atraumatic. Oropharynx is clear and moist. No oropharyngeal exudate. Conjunctivae are normal.  No scleral icterus. Neck: Neck supple. Trachea midline. Cardiovascular: Normal rate, regular rhythm and intact distal pulses. No M/R/G Pulmonary/chest: Effort normal and breath sounds normal. No wheezing, rales or rhonchi. Abdominal: Soft, nontender, nondistended. Bowel sounds active throughout. There are no masses palpable. No hepatosplenomegaly. Extremities: no clubbing, cyanosis, or edema Lymphadenopathy: No cervical adenopathy noted. Neurological: Alert and oriented to person place and time. Skin: Skin is warm and dry. Bilateral hyperpigmented macular rash most consistent with tinea in inguinal canal Psychiatric: Normal mood and affect. Behavior is normal.  Labs per rheumatology     Assessment & Plan:  65year old male with a past medical history of pan ulcerative colitis with arthropathy, adenomatous colon polyps and GERD who seen for follow-up.  1. Pan ulcerative colitis -- clinically he remains in remission though he did have some mild histologic activity in the distal sigmoid and rectosigmoid. There was no dysplasia on surveillance. He had a polyp which was removed and found to be tubular adenoma.  He will continue Cimzia 400 mg every [redacted] weeks along with methotrexate at current dose. Repeat surveillance  colonoscopy recommended July 2019. Annual flu vaccine recommended. --Dermatology referral recommended for skin check given that he is on Biologics and has not seen dermatology before  2. GERD -- lifelong and for this reason I recommended upper endoscopy to exclude Barrett's esophagus. We discussed the risks, benefits and alternatives and he wishes to proceed. He's had good response to pantoprazole and we will continue 40 mg daily. GERD diet recommended  3. Tinea corporis -- clotrimazole 1% cream twice a day until improved  One-year follow-up, sooner if necessary

## 2016-05-01 ENCOUNTER — Encounter: Payer: Self-pay | Admitting: Internal Medicine

## 2016-05-01 ENCOUNTER — Ambulatory Visit (AMBULATORY_SURGERY_CENTER): Payer: 59 | Admitting: Internal Medicine

## 2016-05-01 ENCOUNTER — Telehealth: Payer: Self-pay | Admitting: *Deleted

## 2016-05-01 VITALS — BP 143/89 | HR 63 | Temp 97.8°F | Resp 17 | Ht 69.0 in | Wt 226.0 lb

## 2016-05-01 DIAGNOSIS — K219 Gastro-esophageal reflux disease without esophagitis: Secondary | ICD-10-CM | POA: Diagnosis present

## 2016-05-01 MED ORDER — SODIUM CHLORIDE 0.9 % IV SOLN: 500.0000 mL | INTRAVENOUS | Status: DC

## 2016-05-01 NOTE — Op Note (Signed)
[place] [facility] Patient Name: Christopher Smith Procedure Date: 05/01/2016 9:06 AM MRN: [id number] Endoscopist: Jerene Bears , MD Age: 65 Referring MD:  Date of Birth: 1951-05-16 Gender: Male Account #: [id number] Procedure:                Upper GI endoscopy Indications:              Gastro-esophageal reflux disease Medicines:                Monitored Anesthesia Care Procedure:                Pre-Anesthesia Assessment:                           - Prior to the procedure, a History and Physical                            was performed, and patient medications and                            allergies were reviewed. The patient's tolerance of                            previous anesthesia was also reviewed. The risks                            and benefits of the procedure and the sedation                            options and risks were discussed with the patient.                            All questions were answered, and informed consent                            was obtained. Prior Anticoagulants: The patient has                            taken no previous anticoagulant or antiplatelet                            agents. ASA Grade Assessment: II - A patient with                            mild systemic disease. After reviewing the risks                            and benefits, the patient was deemed in                            satisfactory condition to undergo the procedure.                           After obtaining informed consent, the endoscope was  passed under direct vision. Throughout the                            procedure, the patient's blood pressure, pulse, and                            oxygen saturations were monitored continuously. The                            Model GIF-[id number]) scope was introduced                            through the mouth, and advanced to the second part                            of duodenum. The upper  GI endoscopy was                            accomplished without difficulty. The patient                            tolerated the procedure well. Scope In: Scope Out: Findings:                 The esophagus and gastroesophageal junction were                            examined with white light and narrow band imaging                            (NBI) from a forward view and retroflexed position.                            There were esophageal mucosal changes suspicious                            for short-segment Barrett's esophagus. These                            changes involved the mucosa extending to the Z-line                            (39 cm from the incisors). Squamous island was                            present at 38 cm. Mucosa was biopsied with a cold                            forceps for histology in a targeted manner 1 cm                            proximal to the GE junction. One specimen bottle  was sent to pathology.                           The exam of the esophagus was otherwise normal.                           A 1 cm hiatal hernia was present.                           Normal mucosa was found in the stomach.                           The examined duodenum was normal. Complications:            No immediate complications. Estimated Blood Loss:     Estimated blood loss was minimal. Impression:               - Esophageal mucosal changes suspicious for                            short-segment Barrett's esophagus. Biopsied.                           - 1 cm hiatal hernia.                           - Normal mucosa was found in the stomach.                           - Normal examined duodenum. Recommendation:           - Patient has a contact number available for                            emergencies. The signs and symptoms of potential                            delayed complications were discussed with the                            patient.  Return to normal activities tomorrow.                            Written discharge instructions were provided to the                            patient.                           - Resume previous diet.                           - Continue present medications.                           - Await pathology results.                           -  Repeat upper endoscopy (date not yet determined)                            if biopsies consistent with Barrett's esophagus. Jerene Bears, MD 05/01/2016 9:22:36 AM This report has been signed electronically.

## 2016-05-01 NOTE — Progress Notes (Signed)
Called to room to assist during endoscopic procedure.  Patient ID and intended procedure confirmed with present staff. Received instructions for my participation in the procedure from the performing physician.  

## 2016-05-01 NOTE — Patient Instructions (Signed)
YOU HAD AN ENDOSCOPIC PROCEDURE TODAY AT THE [facility]:   Refer to the procedure report that was given to you for any specific questions about what was found during the examination.  If the procedure report does not answer your questions, please call your gastroenterologist to clarify.  If you requested that your care partner not be given the details of your procedure findings, then the procedure report has been included in a sealed envelope for you to review at your convenience later.  YOU SHOULD EXPECT: Some feelings of bloating in the abdomen. Passage of more gas than usual.  Walking can help get rid of the air that was put into your GI tract during the procedure and reduce the bloating. If you had a lower endoscopy (such as a colonoscopy or flexible sigmoidoscopy) you may notice spotting of blood in your stool or on the toilet paper. If you underwent a bowel prep for your procedure, you may not have a normal bowel movement for a few days.  Please Note:  You might notice some irritation and congestion in your nose or some drainage.  This is from the oxygen used during your procedure.  There is no need for concern and it should clear up in a day or so.  SYMPTOMS TO REPORT IMMEDIATELY:   Following upper endoscopy (EGD)  Vomiting of blood or coffee ground material  New chest pain or pain under the shoulder blades  Painful or persistently difficult swallowing  New shortness of breath  Fever of 100F or higher  Black, tarry-looking stools  For urgent or emergent issues, a gastroenterologist can be reached at any hour by calling [phone].   DIET:  We do recommend a small meal at first, but then you may proceed to your regular diet.  Drink plenty of fluids but you should avoid alcoholic beverages for 24 hours.  ACTIVITY:  You should plan to take it easy for the rest of today and you should NOT DRIVE or use heavy machinery until tomorrow (because of the sedation medicines used  during the test).    FOLLOW UP: Our staff will call the number listed on your records the next business day following your procedure to check on you and address any questions or concerns that you may have regarding the information given to you following your procedure. If we do not reach you, we will leave a message.  However, if you are feeling well and you are not experiencing any problems, there is no need to return our call.  We will assume that you have returned to your regular daily activities without incident.  If any biopsies were taken you will be contacted by phone or by letter within the next 1-3 weeks.  Please call us at [phone] if you have not heard about the biopsies in 3 weeks.    SIGNATURES/CONFIDENTIALITY: You and/or your care partner have signed paperwork which will be entered into your electronic medical record.  These signatures attest to the fact that that the information above on your After Visit Summary has been reviewed and is understood.  Full responsibility of the confidentiality of this discharge information lies with you and/or your care-partner. 

## 2016-05-01 NOTE — Progress Notes (Signed)
Report to PACU, RN, vss, BBS= Clear.  

## 2016-05-01 NOTE — Telephone Encounter (Signed)
Opened in error

## 2016-05-02 ENCOUNTER — Telehealth: Payer: Self-pay

## 2016-05-02 NOTE — Telephone Encounter (Signed)
  Follow up Call-  Call back number 05/01/2016 02/01/2016 03/14/2014  Post procedure Call Back phone  # [phone]  Permission to leave phone message Yes Yes Yes  Some recent data might be hidden     Patient questions:  Do you have a fever, pain , or abdominal swelling? No. Pain Score  0 *  Have you tolerated food without any problems? Yes.    Have you been able to return to your normal activities? Yes.    Do you have any questions about your discharge instructions: Diet   No. Medications  No. Follow up visit  No.  Do you have questions or concerns about your Care? No.  Actions: * If pain score is 4 or above: No action needed, pain <4.

## 2016-05-02 NOTE — Telephone Encounter (Signed)
Left a message at [phone] for the pt to call us back if any questions or concerns. aw

## 2016-05-06 ENCOUNTER — Encounter: Payer: Self-pay | Admitting: Internal Medicine

## 2016-05-09 ENCOUNTER — Other Ambulatory Visit: Payer: Self-pay | Admitting: Rheumatology

## 2016-05-09 LAB — CBC WITH DIFFERENTIAL/PLATELET
BASOS ABS: 0 {cells}/uL (ref 0–200)
Basophils Relative: 0 %
Eosinophils Absolute: 256 cells/uL (ref 15–500)
Eosinophils Relative: 4 %
HEMATOCRIT: 44.8 % (ref 38.5–50.0)
HEMOGLOBIN: 15.4 g/dL (ref 13.2–17.1)
LYMPHS ABS: 2176 {cells}/uL (ref 850–3900)
LYMPHS PCT: 34 %
MCH: 32.2 pg (ref 27.0–33.0)
MCHC: 34.4 g/dL (ref 32.0–36.0)
MCV: 93.5 fL (ref 80.0–100.0)
MONO ABS: 640 {cells}/uL (ref 200–950)
MPV: 11.1 fL (ref 7.5–12.5)
Monocytes Relative: 10 %
NEUTROS PCT: 52 %
Neutro Abs: 3328 cells/uL (ref 1500–7800)
Platelets: 278 10*3/uL (ref 140–400)
RBC: 4.79 MIL/uL (ref 4.20–5.80)
RDW: 15.5 % — AB (ref 11.0–15.0)
WBC: 6.4 10*3/uL (ref 3.8–10.8)

## 2016-05-09 LAB — COMPLETE METABOLIC PANEL WITH GFR
ALBUMIN: 4.4 g/dL (ref 3.6–5.1)
ALK PHOS: 48 U/L (ref 40–115)
ALT: 45 U/L (ref 9–46)
AST: 38 U/L — AB (ref 10–35)
BUN: 17 mg/dL (ref 7–25)
CALCIUM: 9.5 mg/dL (ref 8.6–10.3)
CHLORIDE: 100 mmol/L (ref 98–110)
CO2: 27 mmol/L (ref 20–31)
CREATININE: 1.08 mg/dL (ref 0.70–1.25)
GFR, Est African American: 82 mL/min (ref >=60)
GFR, Est Non African American: 71 mL/min (ref >=60)
GLUCOSE: 104 mg/dL — AB (ref 65–99)
POTASSIUM: 4.5 mmol/L (ref 3.5–5.3)
SODIUM: 137 mmol/L (ref 135–146)
Total Bilirubin: 1 mg/dL (ref 0.2–1.2)
Total Protein: 7.8 g/dL (ref 6.1–8.1)

## 2016-05-09 LAB — URIC ACID: Uric Acid, Serum: 4.3 mg/dL (ref 4.0–8.0)

## 2016-06-10 ENCOUNTER — Other Ambulatory Visit: Payer: Self-pay | Admitting: Rheumatology

## 2016-06-10 DIAGNOSIS — Z79899 Other long term (current) drug therapy: Secondary | ICD-10-CM

## 2016-06-10 DIAGNOSIS — M1A09X0 Idiopathic chronic gout, multiple sites, without tophus (tophi): Secondary | ICD-10-CM

## 2016-06-10 NOTE — Telephone Encounter (Signed)
Last visit 02/22/16 Next visit 08/06/16 Labs 02/06/16 Ok to refill per Dr Estanislado Pandy one month supply  Labs due

## 2016-07-16 ENCOUNTER — Other Ambulatory Visit: Payer: Self-pay | Admitting: Rheumatology

## 2016-07-16 NOTE — Telephone Encounter (Signed)
05/09/16 labs WNL  Last visit  02/22/16 Next visit 08/06/16 Ok to refill per Dr Estanislado Pandy

## 2016-07-23 ENCOUNTER — Other Ambulatory Visit: Payer: Self-pay | Admitting: *Deleted

## 2016-07-23 MED ORDER — CERTOLIZUMAB PEGOL 2 X 200 MG/ML ~~LOC~~ KIT: 400.0000 mg | PACK | SUBCUTANEOUS | 2 refills | Status: DC

## 2016-07-23 NOTE — Telephone Encounter (Signed)
ok 

## 2016-07-23 NOTE — Telephone Encounter (Signed)
Refill request received via fax for Cimzia  Last Visit:02/22/16 Next Visit: 08/06/16 Labs: 05/09/16 Mild AST Elevation-38 Yearly Chest X-ray WNL 09/06/15  Okay to refill Cimzia?

## 2016-08-05 DIAGNOSIS — M12819 Other specific arthropathies, not elsewhere classified, unspecified shoulder: Secondary | ICD-10-CM | POA: Insufficient documentation

## 2016-08-05 DIAGNOSIS — M24521 Contracture, right elbow: Secondary | ICD-10-CM | POA: Insufficient documentation

## 2016-08-05 DIAGNOSIS — K219 Gastro-esophageal reflux disease without esophagitis: Secondary | ICD-10-CM | POA: Insufficient documentation

## 2016-08-05 DIAGNOSIS — M1A0790 Idiopathic chronic gout, unspecified ankle and foot, without tophus (tophi): Secondary | ICD-10-CM | POA: Insufficient documentation

## 2016-08-05 DIAGNOSIS — M1990 Unspecified osteoarthritis, unspecified site: Secondary | ICD-10-CM | POA: Insufficient documentation

## 2016-08-05 DIAGNOSIS — Z79899 Other long term (current) drug therapy: Secondary | ICD-10-CM | POA: Insufficient documentation

## 2016-08-05 DIAGNOSIS — Z227 Latent tuberculosis: Secondary | ICD-10-CM | POA: Insufficient documentation

## 2016-08-05 DIAGNOSIS — Z96653 Presence of artificial knee joint, bilateral: Secondary | ICD-10-CM | POA: Insufficient documentation

## 2016-08-05 NOTE — Progress Notes (Deleted)
Office Visit Note  Patient: Christopher Smith             Date of Birth: 09/22/1950           MRN: [id number]             PCP: Thressa Sheller, MD Referring: Thressa Sheller, MD Visit Date: 08/06/2016 Occupation: Maintenance supervisor    Subjective:  No chief complaint on file.   History of Present Illness: Christopher Smith is a 66 y.o. male ***   Activities of Daily Living:  Patient reports morning stiffness for *** {minute/hour:19697}.   Patient {ACTIONS;DENIES/REPORTS:21021675::"Denies"} nocturnal pain.  Difficulty dressing/grooming: {ACTIONS;DENIES/REPORTS:21021675::"Denies"} Difficulty climbing stairs: {ACTIONS;DENIES/REPORTS:21021675::"Denies"} Difficulty getting out of chair: {ACTIONS;DENIES/REPORTS:21021675::"Denies"} Difficulty using hands for taps, buttons, cutlery, and/or writing: {ACTIONS;DENIES/REPORTS:21021675::"Denies"}   No Rheumatology ROS completed.   PMFS History:  Patient Active Problem List   Diagnosis Date Noted  . Chronic inflammatory arthritis 08/05/2016  . High risk medication use 08/05/2016  . Contracture of elbow, right 08/05/2016  . History of total knee replacement, bilateral 08/05/2016  . 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 History of bilateral rotator cuff tear repair 08/05/2016  . Gastroesophageal reflux  08/05/2016  . Ulcerative colitis 09/03/2011  . Glucose intolerance (impaired glucose tolerance) 09/03/2011  . Severe diarrhea 09/03/2011  . URI (upper respiratory infection) 07/02/2011  . Bile salt-induced diarrhea 07/02/2011  . S/P cholecystectomy 07/02/2011  . Ulcerative colitis, unspecified 04/03/2011  . Benign neoplasm of colon 04/03/2011  . Positive TB test 03/22/2011  . Insomnia due to medical condition 03/22/2011  . UC (ulcerative colitis) ([place]) 03/01/2011  . Leg cramps 03/01/2011  . Ulcerative colitis with rectal bleeding ([place]) 01/24/2011  . Nonspecific abnormal finding in stool contents 01/24/2011    . Heme positive stool 01/11/2011  . Diarrhea 01/11/2011  . Arthritis associated with inflammatory bowel disease 01/11/2011  . Intentional weight loss 01/11/2011    Past Medical History:  Diagnosis Date  . Anal fissure   . Arthritis    RA  . Blood transfusion without reported diagnosis   . Diabetes mellitus    no meds taken 05-01-16  . Diarrhea   . GERD (gastroesophageal reflux disease)   . Gout   . Internal hemorrhoids   . Polyarthritis   . Sessile rectal polyp 03/27/2011  . Sinusitis   . Tuberculosis    positive PPD- had an allergic reaction to the PPD, gets yearly chest xray  . Tubular adenoma of colon   . Ulcerative colitis 01/24/2011  . Ulcerative colitis ([place])     Family History  Problem Relation Age of Onset  . Alzheimer's disease Father   . Stroke Mother   . Colon cancer Neg Hx   . Esophageal cancer Neg Hx   . Stomach cancer Neg Hx   . Rectal cancer Neg Hx    Past Surgical History:  Procedure Laterality Date  . CHOLECYSTECTOMY    . COLONOSCOPY    . KNEE SURGERY     Bilateral   . SHOULDER SURGERY     Bilateral    Social History   Social History Narrative   1 caffeine drink daily      Objective: Vital Signs: There were no vitals taken for this visit.   Physical Exam   Musculoskeletal Exam: ***  CDAI Exam: No CDAI exam completed.    Investigation: Findings:  February 2017 chest x-ray normal, June 2011 hep panel negative, 05/09/2016 CBC normal, CMP AST 38, UA negative  Imaging: No results found.  Speciality Comments: No specialty comments available.    Procedures:  No procedures performed Allergies: Patient has no known allergies.   Assessment / Plan:     Visit Diagnoses: Chronic inflammatory arthritis - Erosive disease  Ulcerative colitis - Followed up by Dr. Hilarie Fredrickson  High risk medication use - Since he is subcutaneous every month, methotrexate 0.8 ML subcutaneously per week, folic acid 2 mg by mouth daily  Contracture of  elbow, right  History of total knee replacement, bilateral  Gout - With hyperuricemia allopurinol 300 mg a day, colchicine when necessary  History of bilateral rotator cuff tear repair  Gastroesophageal reflux     Orders: No orders of the defined types were placed in this encounter.  No orders of the defined types were placed in this encounter.   Face-to-face time spent with patient was *** minutes. 50% of time was spent in counseling and coordination of care.  Follow-Up Instructions: No Follow-up on file.   Bo Merino, MD  Note - This record has been created using Editor, commissioning.  Chart creation errors have been sought, but may not always  have been located. Such creation errors do not reflect on  the standard of medical care.

## 2016-08-06 ENCOUNTER — Ambulatory Visit: Payer: Self-pay | Admitting: Rheumatology

## 2016-08-21 ENCOUNTER — Ambulatory Visit (INDEPENDENT_AMBULATORY_CARE_PROVIDER_SITE_OTHER): Payer: 59 | Admitting: Rheumatology

## 2016-08-21 ENCOUNTER — Encounter: Payer: Self-pay | Admitting: Rheumatology

## 2016-08-21 VITALS — BP 159/77 | HR 61 | Resp 14 | Ht 71.0 in | Wt 233.0 lb

## 2016-08-21 DIAGNOSIS — M1A0710 Idiopathic chronic gout, right ankle and foot, without tophus (tophi): Secondary | ICD-10-CM

## 2016-08-21 DIAGNOSIS — M1990 Unspecified osteoarthritis, unspecified site: Secondary | ICD-10-CM | POA: Diagnosis not present

## 2016-08-21 DIAGNOSIS — R7612 Nonspecific reaction to cell mediated immunity measurement of gamma interferon antigen response without active tuberculosis: Secondary | ICD-10-CM | POA: Diagnosis not present

## 2016-08-21 DIAGNOSIS — Z79899 Other long term (current) drug therapy: Secondary | ICD-10-CM | POA: Diagnosis not present

## 2016-08-21 MED ORDER — FOLIC ACID 1 MG PO TABS: 2.0000 mg | ORAL_TABLET | Freq: Every day | ORAL | 3 refills | Status: DC

## 2016-08-21 MED ORDER — COLCHICINE 0.6 MG PO CAPS: 1.0000 | ORAL_CAPSULE | Freq: Every day | ORAL | 0 refills | Status: DC

## 2016-08-21 MED ORDER — "TUBERCULIN-ALLERGY SYRINGES 27G X 1/2"" 1 ML KIT": 1.0000 | PACK | 3 refills | Status: DC

## 2016-08-21 MED ORDER — METHOTREXATE SODIUM CHEMO INJECTION 50 MG/2ML: 20.0000 mg | INTRAMUSCULAR | 0 refills | Status: DC

## 2016-08-21 NOTE — Progress Notes (Signed)
Rheumatology Medication Review by a Pharmacist Does the patient feel that his/her medications are working for him/her?  Yes Has the patient been experiencing any side effects to the medications prescribed?  No Does the patient have any problems obtaining medications?  No  Issues to address at subsequent visits: None   Pharmacist comments:  Christopher Smith is a pleasant 66 yo M who presents for follow up of his rheumatoid arthritis.  Patient is taking Cimzia 400 mg every 4 weeks, methotrexate 0.8 mL weekly, and folic acid 2 mg daily.  His most recent standing labs were on 05/09/16 at which time CBC was normal and CMP was normal except for AST 38.  Patient reports he continues to drink alcohol (5-6 beers per week) while on methotrexate.  Discussed importance of avoiding alcohol while on methotrexate due to increased risk of hepatotoxicity.  Patient voiced understanding.  Patient is due for standing labs today.  He is requesting lab orders through [facility] which is required by his insurance.  Patient has history of positive TB Gold which was treated at [facility] in 2011.  Patient gets yearly chest x-ray.  Most recent chest x-ray was 09/06/2015.  He is due for chest x-ray at this time.  Patient denies any questions or concerns regarding his medications at this time.    Elisabeth Most, Pharm.D., BCPS, CPP Clinical Pharmacist Pager: [phone] Phone: [phone] 08/21/2016 9:08 AM

## 2016-08-21 NOTE — Patient Instructions (Signed)
Standing Labs We placed an order today for your standing lab work.    Please come back and get your standing labs today and every 3 months.    We have open lab Monday through Friday from 8:30-11:30 AM and 1:30-4 PM at the office of Dr. Tresa Moore, PA.   The office is located at [address], [place], [place], [place] [address] No appointment is necessary.   Labs are drawn by [facility].  You may receive a bill from [facility] for your lab work.

## 2016-08-21 NOTE — Progress Notes (Signed)
Office Visit Note  Patient: Christopher Smith             Date of Birth: Oct 03, 1950           MRN: [id number]             PCP: Thressa Sheller, MD Referring: Thressa Sheller, MD Visit Date: 08/21/2016 Occupation: _0 @    Subjective:  Follow-up Follow-up on inflammatory arthritis and high-risk prescription  History of Present Illness: Christopher Smith is a 66 y.o. male   Last seen 02/22/2016 Patient's inflammatory arthritis is well controlled with his current treatment of symptoms see and methotrexate 0.8 ML's per week and folic acid 2 mg daily.  Patient's morning stiffness lasts for about 5 or 10 minutes. He states that after he takes a hot shower his joints feel much better.  He has a history of gout and since he's done so well, he thought it would be okay for him to stop his allopurinol. He stopped it about 2 months ago. He is not having any flares at this time but we encouraged him to restart the allopurinol. He was advised actually to start the colchicine first for the first 2 weeks and then start allopurinol thereafter.  Is a history of TB gold positive. As a result we do annual chest x-ray. He is due for repeat chest x-ray February 2018. The last one was done at [facility] and so he will go there for that update.  His CBC with differential CMP with GFR are due now. Unfortunately his health insurance requires him to go to [facility]. We've given him paperwork to go to [facility].  Patient's only complaint today is he has stiffness in his right shoulder joint right elbow joint and right CMC. He has a history of this. On the last visit in August both shoulders were hurting. And his right [facility] was hurting.  Patient has had both knees replaced and is doing well with them at this time.  Activities of Daily Living:  Patient reports morning stiffness for 15 minutes.   Patient Denies nocturnal pain.  Difficulty dressing/grooming: Denies Difficulty climbing stairs:  Denies Difficulty getting out of chair: Denies Difficulty using hands for taps, buttons, cutlery, and/or writing: Denies   No Rheumatology ROS completed.   PMFS History:  Patient Active Problem List   Diagnosis Date Noted  . Chronic inflammatory arthritis 08/05/2016  . High risk medication use 08/05/2016  . Contracture of elbow, right 08/05/2016  . History of total knee replacement, bilateral 08/05/2016  . 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 History of bilateral rotator cuff tear repair 08/05/2016  . Gastroesophageal reflux  08/05/2016  . Latent tuberculosis by blood test 08/05/2016  . Ulcerative colitis 09/03/2011  . Glucose intolerance (impaired glucose tolerance) 09/03/2011  . Severe diarrhea 09/03/2011  . URI (upper respiratory infection) 07/02/2011  . Bile salt-induced diarrhea 07/02/2011  . S/P cholecystectomy 07/02/2011  . Ulcerative colitis, unspecified 04/03/2011  . Benign neoplasm of colon 04/03/2011  . Positive TB test 03/22/2011  . Insomnia due to medical condition 03/22/2011  . UC (ulcerative colitis) ([place]) 03/01/2011  . Leg cramps 03/01/2011  . Ulcerative colitis with rectal bleeding ([facility]) 01/24/2011  . Nonspecific abnormal finding in stool contents 01/24/2011  . Heme positive stool 01/11/2011  . Diarrhea 01/11/2011  . Arthritis associated with inflammatory bowel disease 01/11/2011  . Intentional weight loss 01/11/2011    Past Medical History:  Diagnosis Date  . Anal fissure   .  Arthritis    RA  . Blood transfusion without reported diagnosis   . Diabetes mellitus    no meds taken 05-01-16  . Diarrhea   . GERD (gastroesophageal reflux disease)   . Gout   . Internal hemorrhoids   . Polyarthritis   . Sessile rectal polyp 03/27/2011  . Sinusitis   . Tuberculosis    positive PPD- had an allergic reaction to the PPD, gets yearly chest xray  . Tubular adenoma of colon   . Ulcerative colitis 01/24/2011  . Ulcerative colitis ([facility])       Family History  Problem Relation Age of Onset  . Alzheimer's disease Father   . Stroke Mother   . Colon cancer Neg Hx   . Esophageal cancer Neg Hx   . Stomach cancer Neg Hx   . Rectal cancer Neg Hx    Past Surgical History:  Procedure Laterality Date  . CHOLECYSTECTOMY    . COLONOSCOPY    . KNEE SURGERY     Bilateral   . SHOULDER SURGERY     Bilateral    Social History   Social History Narrative   1 caffeine drink daily      Objective: Vital Signs: BP (!) 159/77 (BP Location: Left Arm, Patient Position: Sitting, Cuff Size: Large)   Pulse 61   Resp 14   Ht _0  (1.803 m)   Wt 233 lb (105.7 kg)   BMI 32.50 kg/m    Physical Exam   Musculoskeletal Exam:  Full range of motion of all joints Grip strength is equal and strong bilaterally Fiber myalgia tender points are all absent  CDAI Exam: No CDAI exam completed.    Investigation: No additional findings.   Imaging: No results found.  Speciality Comments: No specialty comments available.    Procedures:  No procedures performed Allergies: Patient has no known allergies.   Assessment / Plan:     Visit Diagnoses: High risk medications (not anticoagulants) long-term use - cimzia q 4 wks; mtx 1.8EX; folic 68m; - Plan: CBC with Differential/Platelet, CMP14+EGFR, Uric acid, CBC with Differential/Platelet, CMP14+EGFR, CBC with Differential/Platelet, CMP14+EGFR  Inflammatory arthritis  Chronic gout of right foot, unspecified cause  Positive QuantiFERON-TB Gold test - Plan: DG Chest 2 View   Plan: #1: Inflammatory arthritis. Doing well. No synovitis on examination.  #2: High-risk prescription. Doing well with 70 every 4 weeks Doing well with methotrexate 0.8 ML's per week No change in treatment at this time. Patient is due for labs now.  #3: History of gout. No flare. Patient stopped allopurinol about 2 months ago. I advised him to restart it. He will start with colchicine for the first 2  weeks and then add allopurinol to that. Then he can stop the colder seen sometime in the end of March.  #4: Patient has a history of TB gold positive. Is needing repeat chest x-ray this month. [facility] Patient will go back there to get the repeat chest x-ray. He cannot go today but he will go tomorrow, Thursday, 08/22/2016.  #5: Only of the hands. Right CMC pain off and on.  Bilateral knee joint with totally replacement. Doing well. No complaint.  #6: CBC with differential and CMP with GFR and uric acid due from lab core this week.   Orders: Orders Placed This Encounter  Procedures  . DG Chest 2 View  . CBC with Differential/Platelet  . CMP14+EGFR  . Uric acid   No orders  of the defined types were placed in this encounter.   Face-to-face time spent with patient was 30 minutes. 50% of time was spent in counseling and coordination of care.  Follow-Up Instructions: Return in about 5 months (around 01/18/2017) for infl arthritis,cimzia, mtx, gout,oa hands, oa kj, lleft tkr, .   Eliezer Lofts, PA-C  Note - This record has been created using [employer].  Chart creation errors have been sought, but may not always  have been located. Such creation errors do not reflect on  the standard of medical care.

## 2016-08-22 ENCOUNTER — Ambulatory Visit (HOSPITAL_COMMUNITY)
Admission: RE | Admit: 2016-08-22 | Discharge: 2016-08-22 | Disposition: A | Discharge status: Home / Self Care | Payer: 59 | Source: Ambulatory Visit | Attending: Rheumatology | Admitting: Rheumatology

## 2016-08-22 DIAGNOSIS — R7612 Nonspecific reaction to cell mediated immunity measurement of gamma interferon antigen response without active tuberculosis: Secondary | ICD-10-CM

## 2016-08-22 NOTE — Progress Notes (Signed)
Please tell patient that his chest x-ray done for the positive TB gold a few years ago isNegative

## 2016-08-24 LAB — CMP14+EGFR
ALBUMIN: 4.3 g/dL (ref 3.6–4.8)
ALT: 45 IU/L — ABNORMAL HIGH (ref 0–44)
AST: 40 IU/L (ref 0–40)
Albumin/Globulin Ratio: 1.4 (ref 1.2–2.2)
Alkaline Phosphatase: 53 IU/L (ref 39–117)
BUN / CREAT RATIO: 15 (ref 10–24)
BUN: 15 mg/dL (ref 8–27)
Bilirubin Total: 0.6 mg/dL (ref 0.0–1.2)
CALCIUM: 9.4 mg/dL (ref 8.6–10.2)
CO2: 23 mmol/L (ref 18–29)
CREATININE: 0.98 mg/dL (ref 0.76–1.27)
Chloride: 100 mmol/L (ref 96–106)
GFR calc Af Amer: 92 mL/min/{1.73_m2} (ref >59)
GFR, EST NON AFRICAN AMERICAN: 80 mL/min/{1.73_m2} (ref >59)
GLOBULIN, TOTAL: 3 g/dL (ref 1.5–4.5)
Glucose: 111 mg/dL — ABNORMAL HIGH (ref 65–99)
Potassium: 5.2 mmol/L (ref 3.5–5.2)
SODIUM: 140 mmol/L (ref 134–144)
TOTAL PROTEIN: 7.3 g/dL (ref 6.0–8.5)

## 2016-08-24 LAB — CBC WITH DIFFERENTIAL/PLATELET
Basophils Absolute: 0 10*3/uL (ref 0.0–0.2)
Basos: 0 %
EOS (ABSOLUTE): 0.2 10*3/uL (ref 0.0–0.4)
EOS: 3 %
HEMOGLOBIN: 14.5 g/dL (ref 13.0–17.7)
Hematocrit: 42.9 % (ref 37.5–51.0)
IMMATURE GRANS (ABS): 0 10*3/uL (ref 0.0–0.1)
IMMATURE GRANULOCYTES: 0 %
LYMPHS ABS: 2.2 10*3/uL (ref 0.7–3.1)
Lymphs: 33 %
MCH: 32 pg (ref 26.6–33.0)
MCHC: 33.8 g/dL (ref 31.5–35.7)
MCV: 95 fL (ref 79–97)
MONOCYTES: 10 %
Monocytes Absolute: 0.7 10*3/uL (ref 0.1–0.9)
Neutrophils Absolute: 3.6 10*3/uL (ref 1.4–7.0)
Neutrophils: 54 %
Platelets: 245 10*3/uL (ref 150–379)
RBC: 4.53 x10E6/uL (ref 4.14–5.80)
RDW: 15.2 % (ref 12.3–15.4)
WBC: 6.7 10*3/uL (ref 3.4–10.8)

## 2016-08-24 LAB — URIC ACID: Uric Acid: 8.5 mg/dL (ref 3.7–8.6)

## 2016-10-04 ENCOUNTER — Other Ambulatory Visit: Payer: Self-pay | Admitting: *Deleted

## 2016-10-04 ENCOUNTER — Telehealth: Payer: Self-pay | Admitting: Pharmacist

## 2016-10-04 MED ORDER — COLCHICINE 0.6 MG PO CAPS: 1.0000 | ORAL_CAPSULE | Freq: Every day | ORAL | 5 refills | Status: DC

## 2016-10-04 NOTE — Telephone Encounter (Signed)
Received refill request from [facility] for methotrexate.  Reviewed patient's chart and noted methotrexate was most recently refilled on 08/21/16 for a 90 day supply.  Patient should not be due for a refill.  I called patient to discuss.  Left a message asking him to call me back.     Elisabeth Most, Pharm.D., BCPS, CPP Clinical Pharmacist Pager: [phone] Phone: [phone] 10/04/2016 1:41 PM

## 2016-10-04 NOTE — Telephone Encounter (Signed)
Refill request received via fax  Last Visit: 08/21/16 Next Visit: 01/17/17 Labs: 08/23/16 cbc/ cmp WNL uric acid 8.5  Okay to refill mitigare?

## 2016-10-04 NOTE — Telephone Encounter (Signed)
Received a return phone call from [name].  He confirms he does not need a refill of methotrexate at this time.  He reports he just picked up methotrexate yesterday (rx was called in 08/21/16).  Patient reports adherence with methotrexate 0.8 mL weekly and confirms he uses the multi dose vials with preservative and is able to get at least 2 injections out of each 2 mL vial.    Patient reports his co-pay for Cimzia has increased to $75 this year.  Patient is not sure if he has a Cimzia copay card. I gave patient information on how to sign up for Cimzia copay card.  Patient reports he will look into that.  He denies any further questions at this time.  Reminded patient he is due for labs in May 2018.  Patient voiced understanding.    Elisabeth Most, Pharm.D., BCPS, CPP Clinical Pharmacist Pager: [phone] Phone: [phone] 10/04/2016 3:34 PM

## 2016-11-14 ENCOUNTER — Other Ambulatory Visit: Payer: Self-pay | Admitting: *Deleted

## 2016-11-14 MED ORDER — CERTOLIZUMAB PEGOL 2 X 200 MG/ML ~~LOC~~ KIT: 400.0000 mg | PACK | SUBCUTANEOUS | 2 refills | Status: DC

## 2016-11-14 NOTE — Telephone Encounter (Signed)
Refill request received via fax  Last Visit: 08/21/16 Next Visit: 01/17/17 Labs: 08/23/16 cbc/ cmp WNL Chest X-ray 08/2016 Negative  Left message to advise patient he is due to update labs and to call back with plan.   Okay to refill Cimzia?

## 2016-11-15 ENCOUNTER — Other Ambulatory Visit: Payer: Self-pay | Admitting: *Deleted

## 2016-11-15 DIAGNOSIS — Z79899 Other long term (current) drug therapy: Secondary | ICD-10-CM

## 2016-11-18 ENCOUNTER — Telehealth: Payer: Self-pay | Admitting: Rheumatology

## 2016-11-18 ENCOUNTER — Other Ambulatory Visit: Payer: Self-pay | Admitting: Radiology

## 2016-11-18 DIAGNOSIS — Z79899 Other long term (current) drug therapy: Secondary | ICD-10-CM

## 2016-11-18 NOTE — Telephone Encounter (Signed)
Patient was unable to have labs done today at [facility] on [address] today due to computers being down. Patient wanted you to make sure orders are in system for when he goes back in the am.

## 2016-11-18 NOTE — Telephone Encounter (Signed)
I have released his labs to [facility]

## 2016-11-19 ENCOUNTER — Other Ambulatory Visit: Payer: Self-pay | Admitting: Rheumatology

## 2016-11-19 ENCOUNTER — Other Ambulatory Visit: Payer: Self-pay | Admitting: *Deleted

## 2016-11-19 DIAGNOSIS — K5190 Ulcerative colitis, unspecified, without complications: Secondary | ICD-10-CM

## 2016-11-19 MED ORDER — PANTOPRAZOLE SODIUM 40 MG PO TBEC: 40.0000 mg | DELAYED_RELEASE_TABLET | Freq: Every day | ORAL | 0 refills | Status: DC

## 2016-11-19 NOTE — Telephone Encounter (Signed)
Last Visit: 08/21/16 Next Visit: 01/17/17 Labs: CBC/CMP WNL Uric Acid 8.5 Patient updating labs this week  Okay to refill Allopurinol?

## 2016-11-20 LAB — CMP14+EGFR
ALK PHOS: 65 IU/L (ref 39–117)
ALT: 48 IU/L — ABNORMAL HIGH (ref 0–44)
AST: 38 IU/L (ref 0–40)
Albumin/Globulin Ratio: 1.6 (ref 1.2–2.2)
Albumin: 4.4 g/dL (ref 3.6–4.8)
BUN/Creatinine Ratio: 13 (ref 10–24)
BUN: 14 mg/dL (ref 8–27)
Bilirubin Total: 0.5 mg/dL (ref 0.0–1.2)
CALCIUM: 9.5 mg/dL (ref 8.6–10.2)
CO2: 25 mmol/L (ref 18–29)
CREATININE: 1.08 mg/dL (ref 0.76–1.27)
Chloride: 100 mmol/L (ref 96–106)
GFR calc Af Amer: 82 mL/min/{1.73_m2} (ref >59)
GFR, EST NON AFRICAN AMERICAN: 71 mL/min/{1.73_m2} (ref >59)
GLOBULIN, TOTAL: 2.7 g/dL (ref 1.5–4.5)
GLUCOSE: 107 mg/dL — AB (ref 65–99)
Potassium: 5.2 mmol/L (ref 3.5–5.2)
SODIUM: 141 mmol/L (ref 134–144)
Total Protein: 7.1 g/dL (ref 6.0–8.5)

## 2016-11-20 LAB — CBC WITH DIFFERENTIAL/PLATELET
BASOS ABS: 0 10*3/uL (ref 0.0–0.2)
Basos: 0 %
EOS (ABSOLUTE): 0.2 10*3/uL (ref 0.0–0.4)
EOS: 3 %
HEMATOCRIT: 42.7 % (ref 37.5–51.0)
HEMOGLOBIN: 14.9 g/dL (ref 13.0–17.7)
IMMATURE GRANULOCYTES: 0 %
Immature Grans (Abs): 0 10*3/uL (ref 0.0–0.1)
Lymphocytes Absolute: 2.7 10*3/uL (ref 0.7–3.1)
Lymphs: 35 %
MCH: 32.3 pg (ref 26.6–33.0)
MCHC: 34.9 g/dL (ref 31.5–35.7)
MCV: 93 fL (ref 79–97)
MONOCYTES: 10 %
MONOS ABS: 0.8 10*3/uL (ref 0.1–0.9)
NEUTROS PCT: 52 %
Neutrophils Absolute: 4.1 10*3/uL (ref 1.4–7.0)
Platelets: 255 10*3/uL (ref 150–379)
RBC: 4.61 x10E6/uL (ref 4.14–5.80)
RDW: 15.3 % (ref 12.3–15.4)
WBC: 7.9 10*3/uL (ref 3.4–10.8)

## 2016-11-22 ENCOUNTER — Telehealth: Payer: Self-pay | Admitting: Rheumatology

## 2016-11-22 NOTE — Telephone Encounter (Signed)
Patient returning Andrea's call.

## 2016-11-22 NOTE — Telephone Encounter (Signed)
Attempted to contact the patient and left message for patient to call the office.  

## 2016-11-25 NOTE — Telephone Encounter (Signed)
Left message to advise patient of lab results.

## 2017-01-01 ENCOUNTER — Other Ambulatory Visit: Payer: Self-pay | Admitting: Internal Medicine

## 2017-01-01 DIAGNOSIS — R740 Nonspecific elevation of levels of transaminase and lactic acid dehydrogenase [LDH]: Principal | ICD-10-CM

## 2017-01-01 DIAGNOSIS — R7401 Elevation of levels of liver transaminase levels: Secondary | ICD-10-CM

## 2017-01-02 ENCOUNTER — Encounter: Payer: Self-pay | Admitting: Rheumatology

## 2017-01-02 ENCOUNTER — Ambulatory Visit (INDEPENDENT_AMBULATORY_CARE_PROVIDER_SITE_OTHER): Payer: 59 | Admitting: Rheumatology

## 2017-01-02 VITALS — BP 144/70 | HR 84 | Resp 14 | Ht 71.0 in | Wt 229.0 lb

## 2017-01-02 DIAGNOSIS — M79641 Pain in right hand: Secondary | ICD-10-CM | POA: Diagnosis not present

## 2017-01-02 DIAGNOSIS — R7612 Nonspecific reaction to cell mediated immunity measurement of gamma interferon antigen response without active tuberculosis: Secondary | ICD-10-CM

## 2017-01-02 DIAGNOSIS — M1990 Unspecified osteoarthritis, unspecified site: Secondary | ICD-10-CM

## 2017-01-02 DIAGNOSIS — Z79899 Other long term (current) drug therapy: Secondary | ICD-10-CM | POA: Diagnosis not present

## 2017-01-02 DIAGNOSIS — M1A0710 Idiopathic chronic gout, right ankle and foot, without tophus (tophi): Secondary | ICD-10-CM

## 2017-01-02 MED ORDER — LIDOCAINE HCL 2 % IJ SOLN
0.3000 mL | INTRAMUSCULAR | Status: AC | PRN
  Administered 2017-01-02: .3 mL

## 2017-01-02 MED ORDER — TRIAMCINOLONE ACETONIDE 40 MG/ML IJ SUSP
10.0000 mg | INTRAMUSCULAR | Status: AC | PRN
  Administered 2017-01-02: 10 mg via INTRA_ARTICULAR

## 2017-01-02 NOTE — Progress Notes (Signed)
Office Visit Note  Patient: Christopher Smith             Date of Birth: 12-12-1950           MRN: [id number]             PCP: Thressa Sheller, MD Referring: Thressa Sheller, MD Visit Date: 01/02/2017 Occupation: @GUAROCC @    Subjective:  Medication Management (has had elevated LFT will have [place] soon )   History of Present Illness: Christopher Smith is a 66 y.o. male  Last visit to our office was every 09/03/2016 .  At the last visit, patient reported that he was doing very well with his inflammatory arthritis with methotrexate 0.8 ML per week and folic acid 2 mg per day. He also reported that his morning stiffness was only 5-10 minutes.  His gout was also doing well. He had stopped his allopurinol since he hadn't had any flare in a long time but in the last visit we reminded him to continue allopurinol.   Today, pt is doing well.  He reports right cmc pain. Wants an injection in right cmc joint. (never had a cortisone injection to cmc in past).   Activities of Daily Living:  Patient reports morning stiffness for 10 minutes.   Patient Denies nocturnal pain.  Difficulty dressing/grooming: Denies Difficulty climbing stairs: Denies Difficulty getting out of chair: Denies Difficulty using hands for taps, buttons, cutlery, and/or writing: Reports   No Rheumatology ROS completed.   PMFS History:  Patient Active Problem List   Diagnosis Date Noted  . Chronic inflammatory arthritis 08/05/2016  . High risk medication use 08/05/2016  . Contracture of elbow, right 08/05/2016  . History of total knee replacement, bilateral 08/05/2016  . 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 History of bilateral rotator cuff tear repair 08/05/2016  . Gastroesophageal reflux  08/05/2016  . Latent tuberculosis by blood test 08/05/2016  . Ulcerative colitis 09/03/2011  . Glucose intolerance (impaired glucose tolerance) 09/03/2011  . Severe diarrhea 09/03/2011  . URI (upper  respiratory infection) 07/02/2011  . Bile salt-induced diarrhea 07/02/2011  . S/P cholecystectomy 07/02/2011  . Ulcerative colitis, unspecified 04/03/2011  . Benign neoplasm of colon 04/03/2011  . Positive TB test 03/22/2011  . Insomnia due to medical condition 03/22/2011  . UC (ulcerative colitis) ([place]) 03/01/2011  . Leg cramps 03/01/2011  . Ulcerative colitis with rectal bleeding ([place]) 01/24/2011  . Nonspecific abnormal finding in stool contents 01/24/2011  . Heme positive stool 01/11/2011  . Diarrhea 01/11/2011  . Arthritis associated with inflammatory bowel disease 01/11/2011  . Intentional weight loss 01/11/2011    Past Medical History:  Diagnosis Date  . Anal fissure   . Arthritis    RA  . Blood transfusion without reported diagnosis   . Diabetes mellitus    no meds taken 05-01-16  . Diarrhea   . GERD (gastroesophageal reflux disease)   . Gout   . Internal hemorrhoids   . Polyarthritis   . Sessile rectal polyp 03/27/2011  . Sinusitis   . Tuberculosis    positive PPD- had an allergic reaction to the PPD, gets yearly chest xray  . Tubular adenoma of colon   . Ulcerative colitis 01/24/2011  . Ulcerative colitis ([place])     Family History  Problem Relation Age of Onset  . Alzheimer's disease Father   . Stroke Mother   . Colon cancer Neg Hx   . Esophageal cancer Neg Hx   .  Stomach cancer Neg Hx   . Rectal cancer Neg Hx    Past Surgical History:  Procedure Laterality Date  . CHOLECYSTECTOMY    . COLONOSCOPY    . KNEE SURGERY     Bilateral   . SHOULDER SURGERY     Bilateral    Social History   Social History Narrative   1 caffeine drink daily      Objective: Vital Signs: BP (!) 144/70   Pulse 84   Resp 14   Ht 5' 11"  (1.803 m)   Wt 229 lb (103.9 kg)   BMI 31.94 kg/m    Physical Exam   Musculoskeletal Exam:  Full range of motion of all joints Grip strength is equal and strong bilaterally Fiber myalgia tender points are absent  CDAI Exam: No  CDAI exam completed.  No synovitis. Contractures of the right elbow 29 is not from inflammatory arthritis. Patient reports that he had an injury to the right elbow approximately 20 years ago at work.  Investigation: No additional findings. Orders Only on 11/18/2016  Component Date Value Ref Range Status  . WBC 11/19/2016 7.9  3.4 - 10.8 x10E3/uL Final  . RBC 11/19/2016 4.61  4.14 - 5.80 x10E6/uL Final  . Hemoglobin 11/19/2016 14.9  13.0 - 17.7 g/dL Final  . Hematocrit 11/19/2016 42.7  37.5 - 51.0 % Final  . MCV 11/19/2016 93  79 - 97 fL Final  . MCH 11/19/2016 32.3  26.6 - 33.0 pg Final  . MCHC 11/19/2016 34.9  31.5 - 35.7 g/dL Final  . RDW 11/19/2016 15.3  12.3 - 15.4 % Final  . Platelets 11/19/2016 255  150 - 379 x10E3/uL Final  . Neutrophils 11/19/2016 52  Not Estab. % Final  . Lymphs 11/19/2016 35  Not Estab. % Final  . Monocytes 11/19/2016 10  Not Estab. % Final  . Eos 11/19/2016 3  Not Estab. % Final  . Basos 11/19/2016 0  Not Estab. % Final  . Neutrophils Absolute 11/19/2016 4.1  1.4 - 7.0 x10E3/uL Final  . Lymphocytes Absolute 11/19/2016 2.7  0.7 - 3.1 x10E3/uL Final  . Monocytes Absolute 11/19/2016 0.8  0.1 - 0.9 x10E3/uL Final  . EOS (ABSOLUTE) 11/19/2016 0.2  0.0 - 0.4 x10E3/uL Final  . Basophils Absolute 11/19/2016 0.0  0.0 - 0.2 x10E3/uL Final  . Immature Granulocytes 11/19/2016 0  Not Estab. % Final  . Immature Grans (Abs) 11/19/2016 0.0  0.0 - 0.1 x10E3/uL Final  . Glucose 11/19/2016 107* 65 - 99 mg/dL Final  . BUN 11/19/2016 14  8 - 27 mg/dL Final  . Creatinine, Ser 11/19/2016 1.08  0.76 - 1.27 mg/dL Final  . GFR calc non Af Amer 11/19/2016 71  >59 mL/min/1.73 Final  . GFR calc Af Amer 11/19/2016 82  >59 mL/min/1.73 Final  . BUN/Creatinine Ratio 11/19/2016 13  10 - 24 Final  . Sodium 11/19/2016 141  134 - 144 mmol/L Final  . Potassium 11/19/2016 5.2  3.5 - 5.2 mmol/L Final  . Chloride 11/19/2016 100  96 - 106 mmol/L Final  . CO2 11/19/2016 25  18 - 29 mmol/L  Final  . Calcium 11/19/2016 9.5  8.6 - 10.2 mg/dL Final  . Total Protein 11/19/2016 7.1  6.0 - 8.5 g/dL Final  . Albumin 11/19/2016 4.4  3.6 - 4.8 g/dL Final  . Globulin, Total 11/19/2016 2.7  1.5 - 4.5 g/dL Final  . Albumin/Globulin Ratio 11/19/2016 1.6  1.2 - 2.2 Final  . Bilirubin Total 11/19/2016 0.5  0.0 - 1.2 mg/dL Final  . Alkaline Phosphatase 11/19/2016 65  39 - 117 IU/L Final  . AST 11/19/2016 38  0 - 40 IU/L Final  . ALT 11/19/2016 48* 0 - 44 IU/L Final  Office Visit on 08/21/2016  Component Date Value Ref Range Status  . Uric Acid 08/23/2016 8.5  3.7 - 8.6 mg/dL Final              Therapeutic target for gout patients: <6.0  . WBC 08/23/2016 6.7  3.4 - 10.8 x10E3/uL Final  . RBC 08/23/2016 4.53  4.14 - 5.80 x10E6/uL Final  . Hemoglobin 08/23/2016 14.5  13.0 - 17.7 g/dL Final  . Hematocrit 08/23/2016 42.9  37.5 - 51.0 % Final  . MCV 08/23/2016 95  79 - 97 fL Final  . MCH 08/23/2016 32.0  26.6 - 33.0 pg Final  . MCHC 08/23/2016 33.8  31.5 - 35.7 g/dL Final  . RDW 08/23/2016 15.2  12.3 - 15.4 % Final  . Platelets 08/23/2016 245  150 - 379 x10E3/uL Final  . Neutrophils 08/23/2016 54  Not Estab. % Final  . Lymphs 08/23/2016 33  Not Estab. % Final  . Monocytes 08/23/2016 10  Not Estab. % Final  . Eos 08/23/2016 3  Not Estab. % Final  . Basos 08/23/2016 0  Not Estab. % Final  . Neutrophils Absolute 08/23/2016 3.6  1.4 - 7.0 x10E3/uL Final  . Lymphocytes Absolute 08/23/2016 2.2  0.7 - 3.1 x10E3/uL Final  . Monocytes Absolute 08/23/2016 0.7  0.1 - 0.9 x10E3/uL Final  . EOS (ABSOLUTE) 08/23/2016 0.2  0.0 - 0.4 x10E3/uL Final  . Basophils Absolute 08/23/2016 0.0  0.0 - 0.2 x10E3/uL Final  . Immature Granulocytes 08/23/2016 0  Not Estab. % Final  . Immature Grans (Abs) 08/23/2016 0.0  0.0 - 0.1 x10E3/uL Final  . Glucose 08/23/2016 111* 65 - 99 mg/dL Final  . BUN 08/23/2016 15  8 - 27 mg/dL Final  . Creatinine, Ser 08/23/2016 0.98  0.76 - 1.27 mg/dL Final  . GFR calc non Af Amer  08/23/2016 80  >59 mL/min/1.73 Final  . GFR calc Af Amer 08/23/2016 92  >59 mL/min/1.73 Final  . BUN/Creatinine Ratio 08/23/2016 15  10 - 24 Final  . Sodium 08/23/2016 140  134 - 144 mmol/L Final  . Potassium 08/23/2016 5.2  3.5 - 5.2 mmol/L Final  . Chloride 08/23/2016 100  96 - 106 mmol/L Final  . CO2 08/23/2016 23  18 - 29 mmol/L Final  . Calcium 08/23/2016 9.4  8.6 - 10.2 mg/dL Final  . Total Protein 08/23/2016 7.3  6.0 - 8.5 g/dL Final  . Albumin 08/23/2016 4.3  3.6 - 4.8 g/dL Final  . Globulin, Total 08/23/2016 3.0  1.5 - 4.5 g/dL Final  . Albumin/Globulin Ratio 08/23/2016 1.4  1.2 - 2.2 Final  . Bilirubin Total 08/23/2016 0.6  0.0 - 1.2 mg/dL Final  . Alkaline Phosphatase 08/23/2016 53  39 - 117 IU/L Final  . AST 08/23/2016 40  0 - 40 IU/L Final  . ALT 08/23/2016 45* 0 - 44 IU/L Final     Imaging: No results found.  Speciality Comments: No specialty comments available.    Procedures:  Small Joint Inj Date/Time: 01/02/2017 1:47 PM Performed by: Bo Merino Authorized by: Eliezer Lofts   Consent Given by:  Patient Site marked: the procedure site was marked   Timeout: prior to procedure the correct patient, procedure, and site was verified   Indications:  Pain Location:  Thumb Site:  R thumb CMC Prep: patient was prepped and draped in usual sterile fashion   Needle Size:  27 G Spinal Needle: No   Approach:  Dorsal Ultrasound Guided: No   Fluoroscopic Guidance: No   Medications:  10 mg triamcinolone acetonide 40 MG/ML; 0.3 mL lidocaine 2 % Aspiration Attempted: Yes   Aspirate amount (mL):  0 Patient tolerance:  Patient tolerated the procedure well with no immediate complications Comments: Right CMC joint injected by Dr. Estanislado Pandy without ultrasound guidance. Patient tolerated procedure well. 0.3 ML's of 2% lidocaine without epinephrine was mixed with 10 mg of Kenalog.     Allergies: Patient has no known allergies.   Assessment / Plan:     Visit  Diagnoses: Inflammatory arthritis  High risk medications (not anticoagulants) long-term use  Chronic gout of right foot, unspecified cause  Positive QuantiFERON-TB Gold test  Pain in right hand - Plan: Small Joint Injection/Arthrocentesis   Plan: #1: Inflammatory arthritis. History of contracture of the right elbow at 29 from injury at work  About 20 years ago (not related to inflammatory arthritis). No contractures of the left elbow. No synovitis. No joint pain, swelling, stiffness. Patient does have significant flares during the wintertime/cold weather and is unable to tie shoes and use his hands secondary to pain and stiffness. He does very well during the warmer months.  #2: Bilateral knee joints with total knee joint replacement  #3: High risk prescription Methotrexate 0.8 ML's every week Folic acid 2 mg every day Labs are up-to-date and normal. Labs from 12/24/2016 done at [facility] show CBC with differential within normal limits CMP with GFR with elevated ALT at 57 and AST at 54 (will have ultrasound of the liver to look into the elevated liver function tests). Hemoglobin A1c is normal Lipid panel is normal PSA is normal TSH is normal Uric acid normal at 5.1 Urinalysis was within normal limits except for trace blood in urine Vitamin D was low at 29.3 (patient will discuss with PCP) Microscopic urine shows 0-2 RBCs  CBC with differential, CMP with GFR due in 3 months. Since uric acid isn't desirable range, we will repeat uric acid levels in 6 months from today.  #4: right cmc pain.  Pt requests cortisone injection. See procedure note for full details. It was injected by Dr. Estanislado Pandy. 10 mg of Kenalog mixed with 0.3 mL's of 2% lidocaine without epinephrine was used. Patient tolerated procedure well. There is  #5: Gout. Doing well. Recent uric acid from 12/24/2016 shows levels of 5.1 (desirable range. Patient has restarted his medication per  our advice. He's taking allopurinol 300 mg every day. Staying hydrated. Has minimal alcohol use. He does drink a little but in moderation.    Orders: Orders Placed This Encounter  Procedures  . Small Joint Injection/Arthrocentesis   No orders of the defined types were placed in this encounter.   Face-to-face time spent with patient was 30 minutes. 50% of time was spent in counseling and coordination of care.  Follow-Up Instructions: Return in about 5 months (around 06/04/2017) for inflam arthrit // mtx 0.31m q wk // allopurinol 300 qd // rt cmc pain //.   NEliezer Lofts PA-C   I examined and evaluated the patient with NEliezer LoftsPA. Patient had no synovitis on exam today. The plan of care was discussed as noted above.  SBo Merino MD  Note - This record has been created using DEditor, commissioning  Chart creation errors have been sought,  but may not always  have been located. Such creation errors do not reflect on  the standard of medical care.

## 2017-01-09 ENCOUNTER — Ambulatory Visit
Admission: RE | Admit: 2017-01-09 | Discharge: 2017-01-09 | Disposition: A | Discharge status: Home / Self Care | Payer: 59 | Source: Ambulatory Visit | Attending: Internal Medicine | Admitting: Internal Medicine

## 2017-01-09 DIAGNOSIS — R740 Nonspecific elevation of levels of transaminase and lactic acid dehydrogenase [LDH]: Principal | ICD-10-CM

## 2017-01-09 DIAGNOSIS — R7401 Elevation of levels of liver transaminase levels: Secondary | ICD-10-CM

## 2017-01-17 ENCOUNTER — Ambulatory Visit: Payer: 59 | Admitting: Rheumatology

## 2017-02-06 ENCOUNTER — Telehealth: Payer: Self-pay | Admitting: Rheumatology

## 2017-02-06 NOTE — Telephone Encounter (Signed)
Reviewed patient's chart and noted he has authorization on file for Cimzia through 08/09/2017.  Attempted to call patient to discuss Cimzia and insurance situation.  I left him a message asking him to call me back.    Elisabeth Most, Pharm.D., BCPS, CPP Clinical Pharmacist Pager: [phone] Phone: [phone] 02/06/2017 2:36 PM

## 2017-02-06 NOTE — Telephone Encounter (Signed)
Decrease methotrexate to 0.6 mL subcutaneous every week

## 2017-02-06 NOTE — Telephone Encounter (Signed)
Patient needs a call back to discuss insurance situation, and Cimzia injection. Patient also request a refill MTX. Patient uses [facility].

## 2017-02-06 NOTE — Telephone Encounter (Signed)
Last Visit: 01/02/17 Next Visit: 06/10/17 Labs: 12/24/16 CMP with GFR with elevated ALT at 57 and AST at 54 (will have ultrasound of the liver to look into the elevated liver function tests).   Okay to refill MTX?

## 2017-02-06 NOTE — Telephone Encounter (Signed)
Attempted to contact the patient and left message for patient to call the office.  

## 2017-02-07 MED ORDER — METHOTREXATE SODIUM CHEMO INJECTION 50 MG/2ML: 15.0000 mg | INTRAMUSCULAR | 0 refills | Status: DC

## 2017-02-07 NOTE — Telephone Encounter (Signed)
I received return call from patient.  Reviewed instructions to reduce methotrexate dose to 0.6 mL weekly.  Patient voiced understanding and refill was sent to [facility].    Had discussion about Cimzia and insurance . Patient reports he is retiring on 02/21/17.  He will be getting medicare insurance effective 03/15/17.  He had Cimzia on 01/16/17 and will be due on 02/13/17 and 03/13/17.  Patient is concerned that he will not be able to get Cimzia for 03/13/17 injection.  Advised we can provide him with a sample.    Once he switches to medicare, he will have medicare and medicare supplement plan.  He was told by his insurance agent that he should get Cimzia in the office.  I advised that we can complete benefits investigation for Cimzia once he gets his new insurance.  Cimzia may be better covered through medical benefits and patient would need to go to [facility] to get his Cimzia injections.  Patient voiced understanding.    Last visit: 01/02/17 Next visit: 06/10/17 Labs: 01/02/17 CMP with ALT 57, AST 54 Patient has history of positive TB Gold which was treated at [facility] in 2011.  Patient gets yearly chest x-ray.  Most recent chest x-ray was 08/22/2016 ("No active cardiopulmonary disease").  Okay to give patient Cimzia sample?

## 2017-02-19 ENCOUNTER — Other Ambulatory Visit: Payer: Self-pay | Admitting: Rheumatology

## 2017-02-19 NOTE — Telephone Encounter (Signed)
Patient needs Gout meds refilled at [facility]. Also, Apolonio Schneiders was to call patient in regards to [place], but hasn't heard anything. Patient states he will not have any insurance after Friday. Please call to advise.

## 2017-02-20 MED ORDER — ALLOPURINOL 300 MG PO TABS: 300.0000 mg | ORAL_TABLET | Freq: Every day | ORAL | 0 refills | Status: DC

## 2017-02-20 NOTE — Telephone Encounter (Signed)
I informed patient that Dr. Estanislado Pandy approved Cimzia sample for patient.  Patient confirms he took [place] yesterday, and will be due for next injection 03/19/17 (will use sample) then 04/16/17.  Patient is hoping to get Cimzia set up for adminsitration in hospital for October dose.  He confirms he has his original medicare card now, but is still waiting on the supplement card.  I asked patient to bring [place] his cards once he receives them and then we can submit benefits investigation in September once the cards are active.    Medication Samples have been provided to the patient.  Drug name: Cimzia       Strength: 200 mg        Qty: 2 syringes/1 box  LOT: [id number]  Exp.Date: 07/2017  Dosing instructions: Inject 400 mg under the skin every 4 weeks  The patient has been instructed regarding the correct time, dose, and frequency of taking this medication, including desired effects and most common side effects.   Elisabeth Most, Pharm.D., BCPS, CPP Clinical Pharmacist Pager: [phone] Phone: [phone] 02/20/2017 1:00 PM

## 2017-02-20 NOTE — Telephone Encounter (Signed)
ok 

## 2017-02-20 NOTE — Telephone Encounter (Signed)
Last Visit: 01/02/17 Next Visit: 06/10/17 Labs: 12/24/16 CMP with GFR with elevated ALT at 57 and AST at 54 (will have ultrasound of the liver to look into the elevated liver function tests).  Okay to refill Allopurinol?

## 2017-02-20 NOTE — Telephone Encounter (Signed)
Mild elevation of LFTs . Will monitor. Ok to refill.

## 2017-03-07 ENCOUNTER — Other Ambulatory Visit: Payer: Self-pay | Admitting: Rheumatology

## 2017-03-07 NOTE — Telephone Encounter (Signed)
Last Visit: 01/02/17 Next Visit: 06/10/17 Labs: 12/24/16 CMP with GFR with elevated ALT at 57 and AST at 54 (will have ultrasound of the liver to look into the elevated liver function tests) Chest X-ray 08/2016 Negative  Okay to refill per Dr. Estanislado Pandy

## 2017-03-18 ENCOUNTER — Telehealth: Payer: Self-pay | Admitting: Rheumatology

## 2017-03-18 NOTE — Telephone Encounter (Signed)
Patient came by to give new insurance information so approval can be gotten for Cimzia injection at [facility]. Patient is due for next injection in October. Please call to advise.

## 2017-03-18 NOTE — Telephone Encounter (Signed)
Ran a BIV for pt's Medicare and [employer] supplemental plan. Neither plan will require a prior authorization for outpatient infusion services. He has a $183 deductible and he is responsible for 20% of the cost of the medication and services thereafter.   The [employer] supplement plan will pick up balance after the deductible has been met.   Patient is currently on Cimzia (self inject). His last refill was on on 8/24 (one month) Next appointment is November 27th. Will need to schedule an appointment before his insurance changes over in October to discuss switching over to an infusion medication.   Thanks,  Demetrios Loll, CPhT 4:35 PM

## 2017-04-09 ENCOUNTER — Telehealth: Payer: Self-pay | Admitting: Rheumatology

## 2017-04-09 DIAGNOSIS — Z79899 Other long term (current) drug therapy: Secondary | ICD-10-CM

## 2017-04-09 NOTE — Telephone Encounter (Signed)
Patient going to [facility] on [address]. Please send orders. Patient going next Wednesday.

## 2017-04-09 NOTE — Telephone Encounter (Signed)
ok 

## 2017-04-09 NOTE — Telephone Encounter (Signed)
Last Visit: 01/02/17 Next Visit: 06/10/17 Labs: 12/24/16 CMP with GFR with elevated ALT at 57 and AST at 54 (will have ultrasound of the liver to look into the elevated liver function tests) Chest X-ray 08/2016 Negative  Patient will update labs next Wednesday.  Can we provided patient with 2 samples of Cimzia?

## 2017-04-09 NOTE — Telephone Encounter (Signed)
Called patient to inform him that we will give him 2 samples of the Cimzia to hold him over until his next appointment. We also discussed [employer] patient assistance for Cimzia. We will apply to the program when he picks up the medication on Monday, October 1st. Patient voiced understanding and denies any questions at this time.   Christopher Smith, [place], CPhT 2:55 PM

## 2017-04-09 NOTE — Telephone Encounter (Signed)
Patient called to follow up with the plans for his therapy with his new insurance. (Medicare and [employer]) that starts on October 1st. He has a PartD plan but his co-pay will be expensive.Can can apply for patient assistance. A BIV has already been ran for infusion outpatient services *see previous note.* Patient states that his last injection was on September 5th. He is schedule for his next injection on October 3rd. His next appointment is November 27th.   Please contact patient to update. Can we give him two samples to hold him over until his November appointment? Thanks!  Quetzaly Ebner, [place], CPhT 11:00 AM

## 2017-04-09 NOTE — Telephone Encounter (Signed)
Labs released and faxed

## 2017-04-14 ENCOUNTER — Telehealth: Payer: Self-pay

## 2017-04-14 ENCOUNTER — Telehealth: Payer: Self-pay | Admitting: *Deleted

## 2017-04-14 NOTE — Telephone Encounter (Signed)
Received a fax from [employer] stating that they received pts application and will begin processing our request soon.  Will send document to scan center.  Sherilynn Dieu, [place], CPhT 11:34 AM

## 2017-04-14 NOTE — Telephone Encounter (Signed)
Medication Samples have been provided to the patient.  Drug name: Cimzia      Strength: 400 mg        Qty: 2  LOT: [id number]  Exp.Date: 07/2017 & 04/2018  Dosing instructions: Inject 400 mg every month  The patient has been instructed regarding the correct time, dose, and frequency of taking this medication, including desired effects and most common side effects.   [name] 10:04 AM 04/14/2017

## 2017-04-14 NOTE — Telephone Encounter (Signed)
Patient came by office to get samples of Cimzia to hold him over until his next visit in November (see previous note). We will apply for patient assistance through the [facility] Cares Program. Application was filled out and signed. Will fax application once the provider portion has been signed. Will update once we receive a response and update the patient. Patient voiced understanding and denied any questions at this time.  Makyla Bye, [place], CPhT 10:27 AM

## 2017-05-01 NOTE — Telephone Encounter (Signed)
Received a fax from [place] regarding a denial for Cimzia Patient Assistance. Patient is ineligible.  Called [employer] to clarify. Spoke with Mickel Baas who states that he was denied because he has a Medicare PartD plan. They used to accept applicants with a medicare plan but not any more.   Reference number: [id number] Phone: [phone]  Will send document to scan center.   Called patient to discuss. Had to leave a message.  Brandelyn Henne, [place], CPhT 9:37 AM

## 2017-05-02 NOTE — Telephone Encounter (Signed)
Patient returned call. Informed him about not be eligible for the assistance program. He states that he has enough medication to last until November. He will discuss his options at his next appointment on 05/31/17. Patient has Medicare Part D and an Engineer, maintenance. He may consider infusions. He will discuss this at his next appointment with Dr. Estanislado Pandy.   Haidyn Chadderdon, [place], CPhT 2:13 PM

## 2017-05-14 ENCOUNTER — Other Ambulatory Visit: Payer: Self-pay | Admitting: Rheumatology

## 2017-05-14 NOTE — Telephone Encounter (Signed)
Last Visit: 01/02/17 Next Visit: 06/10/17  Okay to refill per Dr. Estanislado Pandy

## 2017-05-19 ENCOUNTER — Other Ambulatory Visit: Payer: Self-pay | Admitting: Internal Medicine

## 2017-05-19 DIAGNOSIS — K5190 Ulcerative colitis, unspecified, without complications: Secondary | ICD-10-CM

## 2017-05-29 ENCOUNTER — Telehealth: Payer: Self-pay | Admitting: Internal Medicine

## 2017-05-29 DIAGNOSIS — K5190 Ulcerative colitis, unspecified, without complications: Secondary | ICD-10-CM

## 2017-05-29 MED ORDER — PANTOPRAZOLE SODIUM 40 MG PO TBEC: 40.0000 mg | DELAYED_RELEASE_TABLET | Freq: Every day | ORAL | 0 refills | Status: DC

## 2017-05-29 NOTE — Telephone Encounter (Signed)
Rx sent 

## 2017-05-31 NOTE — Progress Notes (Signed)
Office Visit Note  Patient: Christopher Smith             Date of Birth: November 01, 1950           MRN: [id number]             PCP: Thressa Sheller, MD Referring: Thressa Sheller, MD Visit Date: 06/10/2017 Occupation: @GUAROCC @    Subjective:  Arthritis (BIL arm, shoulder and hand pain )   History of Present Illness: Christopher Smith is a 66 y.o. male with history of inflammatory arthritis and ulcerative colitis. He states that he continues to have some discomfort in his hands especially in his right [facility] joint. He denies any inflammation or swelling in his joints. He has not had any gout flare. His some lateral knee replacements are doing well. He continues to have some diarrhea but no blood in his stool. His insurance will not cover subcutaneous Cimzia.   Activities of Daily Living:  Patient reports morning stiffness for 2 hour.   Patient Reports nocturnal pain.  Difficulty dressing/grooming: Denies Difficulty climbing stairs: Denies Difficulty getting out of chair: Denies Difficulty using hands for taps, buttons, cutlery, and/or writing: Reports   Review of Systems  Constitutional: Negative for fatigue, night sweats and weakness ( ).  HENT: Negative for mouth sores, mouth dryness and nose dryness.   Eyes: Negative for redness and dryness.  Respiratory: Negative for shortness of breath and difficulty breathing.   Cardiovascular: Negative for chest pain, palpitations, hypertension, irregular heartbeat and swelling in legs/feet.  Gastrointestinal: Positive for diarrhea. Negative for blood in stool and constipation.  Endocrine: Negative for increased urination.  Musculoskeletal: Positive for arthralgias, joint pain and morning stiffness. Negative for joint swelling, myalgias, muscle weakness, muscle tenderness and myalgias.  Skin: Negative for color change, rash, hair loss, nodules/bumps, skin tightness, ulcers and sensitivity to sunlight.  Allergic/Immunologic: Negative for susceptible  to infections.  Neurological: Negative for dizziness, fainting, memory loss and night sweats.  Hematological: Negative for swollen glands.  Psychiatric/Behavioral: Negative for depressed mood and sleep disturbance. The patient is not nervous/anxious.     PMFS History:  Patient Active Problem List   Diagnosis Date Noted  . Chronic inflammatory arthritis 08/05/2016  . High risk medication use 08/05/2016  . Contracture of elbow, right 08/05/2016  . History of total knee replacement, bilateral 08/05/2016  . 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 History of bilateral rotator cuff tear repair 08/05/2016  . Gastroesophageal reflux  08/05/2016  . Latent tuberculosis by blood test 08/05/2016  . Ulcerative colitis 09/03/2011  . Glucose intolerance (impaired glucose tolerance) 09/03/2011  . Severe diarrhea 09/03/2011  . URI (upper respiratory infection) 07/02/2011  . Bile salt-induced diarrhea 07/02/2011  . S/P cholecystectomy 07/02/2011  . Ulcerative colitis, unspecified 04/03/2011  . Benign neoplasm of colon 04/03/2011  . Positive TB test 03/22/2011  . Insomnia due to medical condition 03/22/2011  . UC (ulcerative colitis) ([place]) 03/01/2011  . Leg cramps 03/01/2011  . Ulcerative colitis with rectal bleeding ([place]) 01/24/2011  . Nonspecific abnormal finding in stool contents 01/24/2011  . Heme positive stool 01/11/2011  . Diarrhea 01/11/2011  . Arthritis associated with inflammatory bowel disease 01/11/2011  . Intentional weight loss 01/11/2011    Past Medical History:  Diagnosis Date  . Anal fissure   . Arthritis    RA  . Blood transfusion without reported diagnosis   . Diabetes mellitus    no meds taken 05-01-16  . Diarrhea   .  GERD (gastroesophageal reflux disease)   . Gout   . Internal hemorrhoids   . Polyarthritis   . Sessile rectal polyp 03/27/2011  . Sinusitis   . Tuberculosis    positive PPD- had an allergic reaction to the PPD, gets yearly chest  xray  . Tubular adenoma of colon   . Ulcerative colitis 01/24/2011  . Ulcerative colitis ([place])     Family History  Problem Relation Age of Onset  . Alzheimer's disease Father   . Stroke Mother   . Cancer Mother        liver & breast   . Rheum arthritis Brother   . Rheum arthritis Brother   . Rheum arthritis Sister   . Rheum arthritis Sister   . Rheum arthritis Sister   . Colon cancer Neg Hx   . Esophageal cancer Neg Hx   . Stomach cancer Neg Hx   . Rectal cancer Neg Hx    Past Surgical History:  Procedure Laterality Date  . CHOLECYSTECTOMY    . COLONOSCOPY    . JOINT REPLACEMENT     BIL knees  . KNEE SURGERY     Bilateral   . SHOULDER SURGERY     Bilateral    Social History   Social History Narrative   1 caffeine drink daily      Objective: Vital Signs: BP 128/80 (BP Location: Left Arm, Patient Position: Sitting, Cuff Size: Normal)   Pulse 62   Resp 17   Ht 5' 11"  (1.803 m)   Wt 224 lb (101.6 kg)   BMI 31.24 kg/m    Physical Exam  Constitutional: He is oriented to person, place, and time. He appears well-developed and well-nourished.  HENT:  Head: Normocephalic and atraumatic.  Eyes: Conjunctivae and EOM are normal. Pupils are equal, round, and reactive to light.  Neck: Normal range of motion. Neck supple.  Cardiovascular: Normal rate, regular rhythm and normal heart sounds.  Pulmonary/Chest: Effort normal and breath sounds normal.  Abdominal: Soft. Bowel sounds are normal.  Neurological: He is alert and oriented to person, place, and time.  Skin: Skin is warm and dry. Capillary refill takes less than 2 seconds.  Psychiatric: He has a normal mood and affect. His behavior is normal.  Nursing note and vitals reviewed.    Musculoskeletal Exam: C-spine and thoracic lumbar spine good range of motion. He has some discomfort range of motion of bilateral shoulders. He has right elbow joint contracture. He had right CMC thickening with some discomfort. He has DIP  PIP thickening with no synovitis. His bilateral knee replacements are doing well.   CDAI Exam: CDAI Homunculus Exam:   Tenderness:  RUE: glenohumeral and carpometacarpal LUE: glenohumeral  Joint Counts:  CDAI Tender Joint count: 2 CDAI Swollen Joint count: 0  Global Assessments:  Patient Global Assessment: 3 Provider Global Assessment: 3  CDAI Calculated Score: 8    Investigation: No additional findings.chest xray: 08/22/2016 CBC Latest Ref Rng & Units 11/19/2016 08/23/2016 05/09/2016  WBC 3.4 - 10.8 x10E3/uL 7.9 6.7 6.4  Hemoglobin 13.0 - 17.7 g/dL 14.9 14.5 15.4  Hematocrit 37.5 - 51.0 % 42.7 42.9 44.8  Platelets 150 - 379 x10E3/uL 255 245 278   CMP Latest Ref Rng & Units 11/19/2016 08/23/2016 05/09/2016  Glucose 65 - 99 mg/dL 107(H) 111(H) 104(H)  BUN 8 - 27 mg/dL 14 15 17   Creatinine 0.76 - 1.27 mg/dL 1.08 0.98 1.08  Sodium 134 - 144 mmol/L 141 140 137  Potassium 3.5 - 5.2  mmol/L 5.2 5.2 4.5  Chloride 96 - 106 mmol/L 100 100 100  CO2 18 - 29 mmol/L 25 23 27   Calcium 8.6 - 10.2 mg/dL 9.5 9.4 9.5  Total Protein 6.0 - 8.5 g/dL 7.1 7.3 7.8  Total Bilirubin 0.0 - 1.2 mg/dL 0.5 0.6 1.0  Alkaline Phos 39 - 117 IU/L 65 53 48  AST 0 - 40 IU/L 38 40 38(H)  ALT 0 - 44 IU/L 48(H) 45(H) 45   Imaging: No results found.  Speciality Comments: No specialty comments available.    Procedures:  No procedures performed Allergies: Patient has no known allergies.   Assessment / Plan:     Visit Diagnoses: Seronegative arthritis - inflammatory, erosive. History in quite well on combination of Cimzia and methotrexate. He had no active synovitis on examination.  High risk medication use - Cimzia 2 injections every 28 days, MTX 6 tablets by mouth every week, folic acid 2 mg by mouth daily ( patient has failed him Humira and Remicade in the past. He states that he marrow controlled his arthritis but not ulcerative colitis)- Plan: CBC with Differential/Platelet, COMPLETE METABOLIC PANEL WITH  GFR. He has not had labs since May 2018. We will check labs today and then every 3 months to monitor for drug toxicity. His insurance will not cover subcutaneous Cimzia. We will try Cimzia subcutaneous for in office administration which requires reconstitution.  Idiopathic chronic gout 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 tophus, unspecified laterality - he has not had any gout flare. Although his uric acid was elevated in February. We will check his uric acid levels today. If uric acid is still elevated we will continue to monitor. And may have to increase allopurinol dose. allopurinol, colchicine Uric acid: 08/23/2016 8.5  Other ulcerative colitis with other complication [facility]): He continues to have some diarrhea without any blood. Overall his UC is better controlled.  Contracture of right elbow: Unchanged  History of total bilateral knee replacement - Dr. Ronnie Derby: Doing well  History of gastroesophageal reflux (GERD)  History of cholecystectomy  Positive TB test - 2011. We will repeat chest x-ray in February 2019.  History of bilateral rotator cuff tear repair: He has some discomfort in his shoulders.  High risk medication use - Plan: CBC with Differential/Platelet, COMPLETE METABOLIC PANEL WITH GFR  Idiopathic chronic gout of multiple sites without tophus - Plan: Uric acid    Orders: Orders Placed This Encounter  Procedures  . DG Chest 2 View   No orders of the defined types were placed in this encounter.    Follow-Up Instructions: Return in about 5 months (around 11/08/2017) for Ulcerative colitis, inflammatory arthritis.   Bo Merino, MD  Note - This record has been created using Editor, commissioning.  Chart creation errors have been sought, but may not always  have been located. Such creation errors do not reflect on  the standard of medical care.

## 2017-06-10 ENCOUNTER — Ambulatory Visit (INDEPENDENT_AMBULATORY_CARE_PROVIDER_SITE_OTHER): Payer: Medicare Other | Admitting: Rheumatology

## 2017-06-10 ENCOUNTER — Encounter: Payer: Self-pay | Admitting: Rheumatology

## 2017-06-10 ENCOUNTER — Telehealth: Payer: Self-pay

## 2017-06-10 VITALS — BP 128/80 | HR 62 | Resp 17 | Ht 71.0 in | Wt 224.0 lb

## 2017-06-10 DIAGNOSIS — Z9049 Acquired absence of other specified parts of digestive tract: Secondary | ICD-10-CM

## 2017-06-10 DIAGNOSIS — Z8719 Personal history of other diseases of the digestive system: Secondary | ICD-10-CM

## 2017-06-10 DIAGNOSIS — M1A0790 Idiopathic chronic gout, unspecified ankle and foot, without tophus (tophi): Secondary | ICD-10-CM | POA: Diagnosis not present

## 2017-06-10 DIAGNOSIS — M1380 Other specified arthritis, unspecified site: Secondary | ICD-10-CM | POA: Diagnosis not present

## 2017-06-10 DIAGNOSIS — Z96653 Presence of artificial knee joint, bilateral: Secondary | ICD-10-CM

## 2017-06-10 DIAGNOSIS — M1A09X0 Idiopathic chronic gout, multiple sites, without tophus (tophi): Secondary | ICD-10-CM

## 2017-06-10 DIAGNOSIS — Z79899 Other long term (current) drug therapy: Secondary | ICD-10-CM | POA: Diagnosis not present

## 2017-06-10 DIAGNOSIS — K51818 Other ulcerative colitis with other complication: Secondary | ICD-10-CM | POA: Diagnosis not present

## 2017-06-10 DIAGNOSIS — M12819 Other specific arthropathies, not elsewhere classified, unspecified shoulder: Secondary | ICD-10-CM

## 2017-06-10 DIAGNOSIS — R7611 Nonspecific reaction to tuberculin skin test without active tuberculosis: Secondary | ICD-10-CM | POA: Diagnosis not present

## 2017-06-10 DIAGNOSIS — M24521 Contracture, right elbow: Secondary | ICD-10-CM

## 2017-06-10 LAB — CBC WITH DIFFERENTIAL/PLATELET
BASOS ABS: 42 {cells}/uL (ref 0–200)
Basophils Relative: 0.5 %
EOS PCT: 2.5 %
Eosinophils Absolute: 208 cells/uL (ref 15–500)
HEMATOCRIT: 44.1 % (ref 38.5–50.0)
HEMOGLOBIN: 15.1 g/dL (ref 13.2–17.1)
LYMPHS ABS: 2922 {cells}/uL (ref 850–3900)
MCH: 32.1 pg (ref 27.0–33.0)
MCHC: 34.2 g/dL (ref 32.0–36.0)
MCV: 93.6 fL (ref 80.0–100.0)
MONOS PCT: 10.5 %
MPV: 11.2 fL (ref 7.5–12.5)
NEUTROS PCT: 51.3 %
Neutro Abs: 4258 cells/uL (ref 1500–7800)
Platelets: 270 10*3/uL (ref 140–400)
RBC: 4.71 10*6/uL (ref 4.20–5.80)
RDW: 14.3 % (ref 11.0–15.0)
Total Lymphocyte: 35.2 %
WBC mixed population: 872 cells/uL (ref 200–950)
WBC: 8.3 10*3/uL (ref 3.8–10.8)

## 2017-06-10 LAB — COMPLETE METABOLIC PANEL WITH GFR
AG Ratio: 1.4 (calc) (ref 1.0–2.5)
ALBUMIN MSPROF: 4.4 g/dL (ref 3.6–5.1)
ALT: 42 U/L (ref 9–46)
AST: 38 U/L — ABNORMAL HIGH (ref 10–35)
Alkaline phosphatase (APISO): 54 U/L (ref 40–115)
BILIRUBIN TOTAL: 0.9 mg/dL (ref 0.2–1.2)
BUN: 15 mg/dL (ref 7–25)
CALCIUM: 9.7 mg/dL (ref 8.6–10.3)
CHLORIDE: 102 mmol/L (ref 98–110)
CO2: 30 mmol/L (ref 20–32)
CREATININE: 1 mg/dL (ref 0.70–1.25)
GFR, EST AFRICAN AMERICAN: 90 mL/min/{1.73_m2} (ref >=60)
GFR, EST NON AFRICAN AMERICAN: 78 mL/min/{1.73_m2} (ref >=60)
GLUCOSE: 97 mg/dL (ref 65–99)
Globulin: 3.1 g/dL (calc) (ref 1.9–3.7)
Potassium: 4.9 mmol/L (ref 3.5–5.3)
Sodium: 139 mmol/L (ref 135–146)
TOTAL PROTEIN: 7.5 g/dL (ref 6.1–8.1)

## 2017-06-10 LAB — URIC ACID: Uric Acid, Serum: 4.8 mg/dL (ref 4.0–8.0)

## 2017-06-10 NOTE — Telephone Encounter (Signed)
Completed a benefits investigation for reconstituted Cimzia with patients insurance. He has medicare A & B and a A[employer]na supplemental plan. Reconstituted Cimzia will be given in the office and therefore billed with Medicare PartB. Patient has a $183 deductible with Medicare.   [employer] supplemental plan and spoke to [place]. She states that his plan will cover the co-insurance from Medicare PartB once the deductible has been met.   Called patient to update. I could not speak to anyone about his Medicare B benefits regarding the Cimzia. He should call to verify coverage. When was you last injection? Did not answer. Left message for patient to call back.   Samuell Knoble, [place], CPhT 3:22 PM

## 2017-06-10 NOTE — Patient Instructions (Addendum)
Standing Labs We placed an order today for your standing lab work.    Please come back and get your standing labs in February and every 3 months  We have open lab Monday through Friday from 8:30-11:30 AM and 1:30-4 PM at the office of Dr. Bo Merino.   The office is located at [address] [place], [place], [place] [address] No appointment is necessary.   Labs are drawn by [facility].  You may receive a bill from [facility] for your lab work. If you have any questions regarding directions or hours of operation,  please call [phone].     Please get chest x-ray at the [facility] in February 2019.

## 2017-06-11 NOTE — Telephone Encounter (Signed)
Patient returned call. He will contact medicare to see if the medication itself will be covered under the deductible. He will call us back and let us know if he wants to proceed. His last injection was November 5th. We will need to contact the manufacture to have the medication delivered as processed as a buy and bill before we schedule his appointment. Patient voices understanding and denies any questions at this time.   Will update when he calls back.  Devontay Celaya, [place], CPhT 8:15 AM

## 2017-06-11 NOTE — Progress Notes (Signed)
Labs are stable.

## 2017-06-12 NOTE — Telephone Encounter (Signed)
Patient returned call. He states that he didn't get much information out of the representative from social security. He states they told him that the plan would cover 80% and he would be responsible for the remaining 20%. I informed him that we could submit a BIV through [employer] (Cimzia patient assistance service). It would require him to sign a release form. He would like to proceed with the BIV.   Pt came to office on 06/11/17 to sign release. Application has been submitted to [employer]. Will update once we receive a response. Patient voices understanding and denies any questions at this time.   Will send document to scan center.   Abenezer Odonell, [place], CPhT 3:46 PM

## 2017-06-17 ENCOUNTER — Other Ambulatory Visit: Payer: Self-pay | Admitting: Rheumatology

## 2017-06-17 NOTE — Telephone Encounter (Signed)
Last Visit: 06/10/17 Next Visit: 11/10/17 Labs: 06/10/17 stable  Okay to refill per Dr. Estanislado Pandy

## 2017-06-18 NOTE — Telephone Encounter (Signed)
Medication Samples have been provided to the patient.  Drug name: Cimzia      Strength: 400 mg        Qty: 1  LOT: [id number] Exp.Date: 04/2018  Dosing instructions: Inject 400 mg every 28 days  The patient has been instructed regarding the correct time, dose, and frequency of taking this medication, including desired effects and most common side effects.   [name] 3:20 PM 06/18/2017

## 2017-06-18 NOTE — Telephone Encounter (Signed)
Called [employer] to check the status of patients Christopher Smith. Spoke with Lattie Haw who states that they never received the fax. Application was re-faxed we should have a response within 24 to 48 hours.   Called patient to update. The clinic is unable to buy cimzia and bill it to his insurance was we discussed. Pts insurance would have to cover the medication though his pharmacy benefits and have the medication delivered to the clinic. Pt has medicare and an [employer] supplemental plan that would allow him to be given the medication via IV at the infusion center. Medicare requires that he pays $183 deductible and is responsible for 20%. His Aetna supplemental plans will cover the 20% after deductible has been met. Patient would like to proceed with getting the medication via IV at the infusion center. He is past due for a refill   Per Seth Bake, LPN and Dr. Estanislado Pandy, we will give patient a sample of Cimzia SubQ to hold him through the end of the year. He plans to pick up his sample between today and tomorrow.    Please set up infusions for reconstituted Cimzia at infusion center starting January 2019. Thank you!   Estefano Victory, [place], CPhT 2:32 PM

## 2017-06-25 DIAGNOSIS — M109 Gout, unspecified: Secondary | ICD-10-CM | POA: Diagnosis not present

## 2017-06-25 DIAGNOSIS — E785 Hyperlipidemia, unspecified: Secondary | ICD-10-CM | POA: Diagnosis not present

## 2017-06-25 DIAGNOSIS — E1122 Type 2 diabetes mellitus with diabetic chronic kidney disease: Secondary | ICD-10-CM | POA: Diagnosis not present

## 2017-06-30 ENCOUNTER — Other Ambulatory Visit: Payer: Self-pay | Admitting: Rheumatology

## 2017-07-01 NOTE — Telephone Encounter (Signed)
Last Visit: 06/10/17 Next Visit: 11/10/17 Labs: 06/10/17 stable  Okay to refill per Dr. Estanislado Pandy

## 2017-07-02 DIAGNOSIS — E785 Hyperlipidemia, unspecified: Secondary | ICD-10-CM | POA: Diagnosis not present

## 2017-07-02 DIAGNOSIS — Z0000 Encounter for general adult medical examination without abnormal findings: Secondary | ICD-10-CM | POA: Diagnosis not present

## 2017-07-02 DIAGNOSIS — R740 Nonspecific elevation of levels of transaminase and lactic acid dehydrogenase [LDH]: Secondary | ICD-10-CM | POA: Diagnosis not present

## 2017-07-02 DIAGNOSIS — N182 Chronic kidney disease, stage 2 (mild): Secondary | ICD-10-CM | POA: Diagnosis not present

## 2017-07-02 DIAGNOSIS — E1122 Type 2 diabetes mellitus with diabetic chronic kidney disease: Secondary | ICD-10-CM | POA: Diagnosis not present

## 2017-07-16 ENCOUNTER — Other Ambulatory Visit: Payer: Self-pay | Admitting: *Deleted

## 2017-07-16 ENCOUNTER — Telehealth: Payer: Self-pay | Admitting: Rheumatology

## 2017-07-16 DIAGNOSIS — M1990 Unspecified osteoarthritis, unspecified site: Secondary | ICD-10-CM

## 2017-07-16 NOTE — Telephone Encounter (Signed)
Orders placed for Cimzia at infusion center. Attempted to contact the patient and left message for patient to call the office.

## 2017-07-16 NOTE — Telephone Encounter (Signed)
Patient is returning Christopher Smith's call.  CB [phone]  Thank you

## 2017-07-16 NOTE — Telephone Encounter (Signed)
Advised patient orders for Cimzia have been placed for him to receive it at the hospital. Patient given number for Medical day to call and schedule appointment.

## 2017-07-23 ENCOUNTER — Telehealth: Payer: Self-pay | Admitting: Rheumatology

## 2017-07-23 ENCOUNTER — Other Ambulatory Visit: Payer: Self-pay | Admitting: *Deleted

## 2017-07-23 NOTE — Telephone Encounter (Signed)
Patient advised that the person he needs to contact at the hospital is [name] and his orders are in the computer.

## 2017-07-23 NOTE — Telephone Encounter (Signed)
Patient left a voicemail stating that he went to the hospital to receive his Cimzia and there were no orders.  Patient's CB # [phone]

## 2017-07-23 NOTE — Telephone Encounter (Signed)
Patient left a voicemail stating that he forgot the name of the doctor that [place] gave him to schedule his Cinzia.  CB [phone]

## 2017-07-23 NOTE — Telephone Encounter (Signed)
Patient advised that the person he needs to contact at the hospital is [name].

## 2017-07-28 ENCOUNTER — Encounter: Payer: Self-pay | Admitting: *Deleted

## 2017-08-06 ENCOUNTER — Encounter: Payer: Self-pay | Admitting: Rheumatology

## 2017-08-06 ENCOUNTER — Ambulatory Visit (INDEPENDENT_AMBULATORY_CARE_PROVIDER_SITE_OTHER): Payer: Medicare Other | Admitting: Internal Medicine

## 2017-08-06 ENCOUNTER — Ambulatory Visit (INDEPENDENT_AMBULATORY_CARE_PROVIDER_SITE_OTHER)
Admission: RE | Admit: 2017-08-06 | Discharge: 2017-08-06 | Disposition: A | Discharge status: Home / Self Care | Payer: Medicare Other | Source: Ambulatory Visit | Attending: Internal Medicine | Admitting: Internal Medicine

## 2017-08-06 ENCOUNTER — Encounter (INDEPENDENT_AMBULATORY_CARE_PROVIDER_SITE_OTHER): Payer: Self-pay

## 2017-08-06 ENCOUNTER — Encounter: Payer: Self-pay | Admitting: Internal Medicine

## 2017-08-06 ENCOUNTER — Other Ambulatory Visit: Payer: Self-pay

## 2017-08-06 ENCOUNTER — Other Ambulatory Visit: Payer: Medicare Other

## 2017-08-06 VITALS — BP 142/84 | HR 72 | Ht 71.0 in | Wt 224.0 lb

## 2017-08-06 DIAGNOSIS — M533 Sacrococcygeal disorders, not elsewhere classified: Secondary | ICD-10-CM | POA: Diagnosis not present

## 2017-08-06 DIAGNOSIS — K5190 Ulcerative colitis, unspecified, without complications: Secondary | ICD-10-CM

## 2017-08-06 DIAGNOSIS — K219 Gastro-esophageal reflux disease without esophagitis: Secondary | ICD-10-CM | POA: Diagnosis not present

## 2017-08-06 MED ORDER — PANTOPRAZOLE SODIUM 40 MG PO TBEC: 40.0000 mg | DELAYED_RELEASE_TABLET | Freq: Every day | ORAL | 1 refills | Status: DC

## 2017-08-06 NOTE — Patient Instructions (Signed)
Your physician has requested that you go to the basement for the following lab work before leaving today: [facility]  We have sent the following medications to your pharmacy for you to pick up at your convenience: Pantoprazole  Your provider has requested that you have an pelvic x ray before leaving today. Please go to the basement floor to our Radiology department for the test.  You will be due for a recall colonoscopy in 01/2018. We will send you a reminder in the mail when it gets closer to that time.  If you are age 9 or older, your body mass index should be between 23-30. Your Body mass index is 31.24 kg/m. If this is out of the aforementioned range listed, please consider follow up with your Primary Care Provider.  If you are age 37 or younger, your body mass index should be between 19-25. Your Body mass index is 31.24 kg/m. If this is out of the aformentioned range listed, please consider follow up with your Primary Care Provider.

## 2017-08-06 NOTE — Progress Notes (Signed)
Subjective:    Patient ID: Christopher Smith, male    DOB: 03/27/1951, 67 y.o.   MRN: [id number]  HPI Dick Hark is a 67 year old male with a history of pan ulcerative colitis with arthropathy, adenomatous colon polyps, GERD who is here for follow-up.  He was last seen in October 2017.  He is here alone today.  He has been maintained on Cimzia 400 mg every 28 days and methotrexate.  He also uses pantoprazole 40 mg daily for reflux.  He reports on the whole his colitis symptoms have been under fair control.  He reports 5-6 soft but mostly loose stools per day.  He denies abdominal pain and blood in his stool.  No melena.  No fevers or chills.  Joint pains have been well controlled with current therapy.  He follows with rheumatology, Dr. Theressa Stamps.  Dr. Estanislado Pandy monitors his lab results.  His next Cimzia dose is due August 18, 2017.  He states due to his insurance changes he needs to change from self administration of Cimzia to infusion center administration.  This is set up for February 4.  When I asked if his symptoms are much different from his last visit he states "not really".  My notes suggest he was having 1-2 formed bowel movements per day.  He states he may have "fudged a little" at that time.  Heartburn is been well controlled with his pantoprazole.  We performed an upper endoscopy for Barrett's screening given his long-standing reflux last October 2017.  This revealed an irregular Z line which was biopsied in a targeted manner.  A 1 cm hiatal hernia.  And a normal stomach and examined duodenum.  Biopsies were negative for Barrett's but consistent with chronic reflux.  He denies dysphagia and odynophagia.  He reports coccyx pain which he notices immediately on standing after sitting for more than 5 minutes.  No known trauma.  This is not anal or rectal pain but located more cephalad near the coccyx.   Review of Systems As per HPI, otherwise negative  Current Medications, Allergies,  Past Medical History, Past Surgical History, Family History and Social History were reviewed in [employer] record.     Objective:   Physical Exam BP (!) 142/84   Pulse 72   Ht 5' 11"  (1.803 m)   Wt 224 lb (101.6 kg)   BMI 31.24 kg/m  Constitutional: Well-developed and well-nourished. No distress. HEENT: Normocephalic and atraumatic. Oropharynx is clear and moist. Conjunctivae are normal.  No scleral icterus. Neck: Neck supple. Trachea midline. Cardiovascular: Normal rate, regular rhythm and intact distal pulses. No M/R/G Pulmonary/chest: Effort normal and breath sounds normal. No wheezing, rales or rhonchi. Abdominal: Soft, nontender, nondistended. Bowel sounds active throughout.  Small umbilical hernia without tenderness Extremities: no clubbing, cyanosis, or edema Neurological: Alert and oriented to person place and time. Skin: Skin is warm and dry. Psychiatric: Normal mood and affect. Behavior is normal.  CBC    Component Value Date/Time   WBC 8.3 06/10/2017 1225   RBC 4.71 06/10/2017 1225   HGB 15.1 06/10/2017 1225   HGB 14.9 11/19/2016 0718   HCT 44.1 06/10/2017 1225   HCT 42.7 11/19/2016 0718   PLT 270 06/10/2017 1225   PLT 255 11/19/2016 0718   MCV 93.6 06/10/2017 1225   MCV 93 11/19/2016 0718   MCH 32.1 06/10/2017 1225   MCHC 34.2 06/10/2017 1225   RDW 14.3 06/10/2017 1225   RDW 15.3 11/19/2016 0718   LYMPHSABS  2,922 06/10/2017 1225   LYMPHSABS 2.7 11/19/2016 0718   MONOABS 640 05/09/2016 0709   EOSABS 208 06/10/2017 1225   EOSABS 0.2 11/19/2016 0718   BASOSABS 42 06/10/2017 1225   BASOSABS 0.0 11/19/2016 0718   CMP     Component Value Date/Time   NA 139 06/10/2017 1225   NA 141 11/19/2016 0718   K 4.9 06/10/2017 1225   CL 102 06/10/2017 1225   CO2 30 06/10/2017 1225   GLUCOSE 97 06/10/2017 1225   BUN 15 06/10/2017 1225   BUN 14 11/19/2016 0718   CREATININE 1.00 06/10/2017 1225   CALCIUM 9.7 06/10/2017 1225   PROT 7.5  06/10/2017 1225   PROT 7.1 11/19/2016 0718   ALBUMIN 4.4 11/19/2016 0718   AST 38 (H) 06/10/2017 1225   ALT 42 06/10/2017 1225   ALKPHOS 65 11/19/2016 0718   BILITOT 0.9 06/10/2017 1225   BILITOT 0.5 11/19/2016 0718   GFRNONAA 78 06/10/2017 1225   GFRAA 90 06/10/2017 1225       Assessment & Plan:  67 year old male with a history of pan ulcerative colitis with arthropathy, adenomatous colon polyps, GERD who is here for follow-up.   1.  Pan ulcerative colitis --he is having slightly more frequent stools without bleeding.  He has been maintained on Cimzia and methotrexate.  I recommended that we check a Cimzia level and antibody today to ensure that he has not developed an antibody to this medication.  If so this would warrant change in therapy.  He briefly took Humira for maybe 6 months when he had severe colitis around the time of diagnosis.  This was changed to Remicade which eventually worked and so he never went back to [employer].  Once he developed Remicade antibodies he went on Cimzia. --Check Cimzia antibodies --Continue Cimzia 400 mg every 28 days --Continue methotrexate per Dr. Estanislado Pandy.  She is monitoring labs --Surveillance colonoscopy July 2019  2.  GERD --without Barrett's esophagus.  Well-controlled on pantoprazole.  Continue pantoprazole 40 mg daily.  3.  Coccyx pain --pelvic x-ray today.  25 minutes spent with the patient today. Greater than 50% was spent in counseling and coordination of care with the patient

## 2017-08-08 ENCOUNTER — Other Ambulatory Visit: Payer: Self-pay | Admitting: Rheumatology

## 2017-08-08 NOTE — Telephone Encounter (Signed)
Last Visit: 06/10/17 Next Visit: 11/10/17 Labs: 06/10/17 stable  Okay to refill per Dr. Estanislado Pandy

## 2017-08-13 ENCOUNTER — Telehealth: Payer: Self-pay | Admitting: *Deleted

## 2017-08-13 NOTE — Telephone Encounter (Signed)
We have received results from [facility] with patient's Cimzia antibodies and concentration levels from 08/06/17. Drug level is 21.6 and anti-drug antibody level is not detected. Per Dr Hilarie Fredrickson, "Cimzia levels are good. There are no antibodies. Continue current therapy. CC: Rheumatology."  I have left a voicemail for patient to call back and lab has been faxed to Dr Arlean Hopping office (fax [phone]). It will also be scanned in our system so will be available under "MEDIA" in Epic for her review as well.

## 2017-08-13 NOTE — Telephone Encounter (Signed)
I have spoken to patient to advise of results of cimzia testing as well as Dr Vena Rua recommendations. He verbalizes understanding.

## 2017-08-14 ENCOUNTER — Other Ambulatory Visit: Payer: Self-pay | Admitting: *Deleted

## 2017-08-14 NOTE — Progress Notes (Signed)
Infusion orders are current for patient CBC CMP Tylenol Benadryl appointments are up to date and follow up appointment  is scheduled TB gold not due yet.  

## 2017-08-15 ENCOUNTER — Other Ambulatory Visit (HOSPITAL_COMMUNITY): Payer: Self-pay | Admitting: *Deleted

## 2017-08-18 ENCOUNTER — Ambulatory Visit (HOSPITAL_COMMUNITY)
Admission: RE | Admit: 2017-08-18 | Discharge: 2017-08-18 | Disposition: A | Discharge status: Home / Self Care | Payer: Medicare Other | Source: Ambulatory Visit | Attending: Rheumatology | Admitting: Rheumatology

## 2017-08-18 DIAGNOSIS — K219 Gastro-esophageal reflux disease without esophagitis: Secondary | ICD-10-CM | POA: Diagnosis not present

## 2017-08-18 DIAGNOSIS — M129 Arthropathy, unspecified: Secondary | ICD-10-CM | POA: Insufficient documentation

## 2017-08-18 DIAGNOSIS — K5100 Ulcerative (chronic) pancolitis without complications: Secondary | ICD-10-CM | POA: Diagnosis not present

## 2017-08-18 DIAGNOSIS — M1990 Unspecified osteoarthritis, unspecified site: Secondary | ICD-10-CM

## 2017-08-18 MED ORDER — CERTOLIZUMAB PEGOL 2 X 200 MG ~~LOC~~ KIT
400.0000 mg | PACK | SUBCUTANEOUS | Status: DC
  Administered 2017-08-18: 400 mg via SUBCUTANEOUS
  Filled 2017-08-18 (×2): qty 1

## 2017-08-18 NOTE — Discharge Instructions (Signed)
Certolizumab pegol injection What is this medicine? CERTOLIZUMAB (SER toe LIZ oo mab) is used to treat Crohn's disease, psoriatic arthritis, or rheumatoid arthritis. This medicine may be used for other purposes; ask your health care provider or pharmacist if you have questions. COMMON BRAND NAME(S): Cimzia What should I tell my health care provider before I take this medicine? They need to know if you have any of these conditions: -diabetes -heart disease -hepatitis B or history of hepatitis B infection -immune system problems -infection or history of infections -low blood counts, like low white cell, platelet, or red cell counts -multiple sclerosis -recently received or scheduled to receive a vaccine -scheduled to have surgery -tuberculosis, a positive skin test for tuberculosis or have recently been in close contact with someone who has tuberculosis -an unusual or allergic reaction to certolizumab, other medicines, foods, dyes, or preservatives -pregnant or trying to get pregnant -breast-feeding How should I use this medicine? This medicine is for injection under the skin. It is usually given by a health care professional in a hospital or clinic setting. If you get this medicine at home, you will be taught how to prepare and give this medicine. Use exactly as directed. Take your medicine at regular intervals. Do not take your medicine more often than directed. It is important that you put your used needles and syringes in a special sharps container. Do not put them in a trash can. If you do not have a sharps container, call your pharmacist or healthcare provider to get one. A special MedGuide will be given to you by the pharmacist with each prescription and refill. Be sure to read this information carefully each time. Talk to your pediatrician regarding the use of this medicine in children. Special care may be needed. Overdosage: If you think you have taken too much of this medicine  contact a poison control center or emergency room at once. NOTE: This medicine is only for you. Do not share this medicine with others. What if I miss a dose? It is important not to miss your dose. Call your doctor or health care professional if you are unable to keep an appointment. If you give yourself the medicine and you miss a dose, take it as soon as you can. If it is almost time for your next dose, take only that dose. Do not take double or extra doses. What may interact with this medicine? Do not take this medicine with any of the following medications: -abatacept -adalimumab -anakinra -etanercept -infliximab -live virus vaccines -rilonacept This medicine may also interact with the following medications: -vaccines This list may not describe all possible interactions. Give your health care provider a list of all the medicines, herbs, non-prescription drugs, or dietary supplements you use. Also tell them if you smoke, drink alcohol, or use illegal drugs. Some items may interact with your medicine. What should I watch for while using this medicine? Visit your doctor or health care professional for regular checks on your progress. Tell your doctor or healthcare professional if your symptoms do not start to get better or if they get worse. Your condition will be monitored carefully while you are receiving this medicine. You will be tested for tuberculosis (TB) before you start this medicine. If your doctor prescribes any medicine for TB, you should start taking the TB medicine before starting this medicine. Make sure to finish the full course of TB medicine. Call your doctor or health care professional for advice if you get a fever, chills,  sore throat, or other symptoms of an infection. Do not treat yourself. This medicine may decrease your body's ability to fight infection. Try to avoid being around people who are sick. Talk to your doctor about your risk of cancer. You may be more at risk  for certain types of cancers if you take this medicine. What side effects may I notice from receiving this medicine? Side effects that you should report to your doctor or health care professional as soon as possible: -allergic reactions like skin rash, itching or hives, swelling of the face, lips, or tongue -breathing problems -changes in vision -chest pain or palpitations -fever or chills, sore throat -pain, tingling, numbness in the hands or feet -red, scaly patches or raised bumps on the skin -seizures -swelling of the ankles, feet, hands -swollen lymph nodes in the neck, underarm, or groin areas -unexplained weight loss -unusual bleeding or bruising -unusually weak or tired Side effects that usually do not require medical attention (report to your doctor or health care professional if they continue or are bothersome): -irritation at site where injected This list may not describe all possible side effects. Call your doctor for medical advice about side effects. You may report side effects to FDA at 1-800-FDA-1088. Where should I keep my medicine? Keep out of the reach of children. If you are using this medicine at home, you will be instructed on how to store this medicine. Throw away any unused medicine after the expiration date on the label. NOTE: This sheet is a summary. It may not cover all possible information. If you have questions about this medicine, talk to your doctor, pharmacist, or health care provider.  2018 Elsevier/Gold Standard (2012-04-15 10:31:30)

## 2017-08-21 MED FILL — Certolizumab Pegol For Inj Kit 2 X 200 MG: SUBCUTANEOUS | Qty: 1 | Status: AC

## 2017-08-21 MED FILL — Certolizumab Pegol Inj Kit 2 X 200 MG/ML: SUBCUTANEOUS | Qty: 1 | Status: AC

## 2017-08-25 MED FILL — Certolizumab Pegol Inj Kit 2 X 200 MG/ML: SUBCUTANEOUS | Qty: 1 | Status: AC

## 2017-09-12 ENCOUNTER — Other Ambulatory Visit: Payer: Self-pay | Admitting: *Deleted

## 2017-09-12 NOTE — Progress Notes (Signed)
Infusion orders are current for patient CBC CMP Tylenol Benadryl appointments are up to date and follow up appointment  is scheduled and yearly chest xray is due.

## 2017-09-15 ENCOUNTER — Ambulatory Visit (HOSPITAL_COMMUNITY)
Admission: RE | Admit: 2017-09-15 | Discharge: 2017-09-15 | Disposition: A | Discharge status: Home / Self Care | Payer: Medicare Other | Source: Ambulatory Visit | Attending: Rheumatology | Admitting: Rheumatology

## 2017-09-15 DIAGNOSIS — M1990 Unspecified osteoarthritis, unspecified site: Secondary | ICD-10-CM | POA: Insufficient documentation

## 2017-09-15 DIAGNOSIS — R7611 Nonspecific reaction to tuberculin skin test without active tuberculosis: Secondary | ICD-10-CM | POA: Insufficient documentation

## 2017-09-15 DIAGNOSIS — Z79899 Other long term (current) drug therapy: Secondary | ICD-10-CM | POA: Insufficient documentation

## 2017-09-15 MED ORDER — CERTOLIZUMAB PEGOL 2 X 200 MG ~~LOC~~ KIT
400.0000 mg | PACK | SUBCUTANEOUS | Status: DC
  Administered 2017-09-15: 400 mg via SUBCUTANEOUS
  Filled 2017-09-15: qty 1

## 2017-09-15 NOTE — Progress Notes (Signed)
CXR normal.

## 2017-09-16 MED FILL — Certolizumab Pegol For Inj Kit 2 X 200 MG: SUBCUTANEOUS | Qty: 1 | Status: AC

## 2017-09-24 ENCOUNTER — Other Ambulatory Visit: Payer: Self-pay | Admitting: Rheumatology

## 2017-09-24 NOTE — Telephone Encounter (Signed)
Last Visit: 06/10/17 Next Visit: 11/10/17 Labs: 06/10/17 stable  Okay to refill per Dr. Estanislado Pandy

## 2017-10-10 ENCOUNTER — Other Ambulatory Visit: Payer: Self-pay | Admitting: *Deleted

## 2017-10-10 NOTE — Progress Notes (Signed)
Infusion orders are current for patient CBC CMP Tylenol Benadryl appointments are up to date and follow up appointment  is scheduled and chest x-ray is up to date.

## 2017-10-13 ENCOUNTER — Other Ambulatory Visit: Payer: Self-pay

## 2017-10-13 ENCOUNTER — Other Ambulatory Visit: Payer: Self-pay | Admitting: *Deleted

## 2017-10-13 ENCOUNTER — Ambulatory Visit (HOSPITAL_COMMUNITY)
Admission: RE | Admit: 2017-10-13 | Discharge: 2017-10-13 | Disposition: A | Discharge status: Home / Self Care | Payer: Medicare Other | Source: Ambulatory Visit | Attending: Rheumatology | Admitting: Rheumatology

## 2017-10-13 DIAGNOSIS — Z79899 Other long term (current) drug therapy: Secondary | ICD-10-CM

## 2017-10-13 DIAGNOSIS — M1A0790 Idiopathic chronic gout, unspecified ankle and foot, without tophus (tophi): Secondary | ICD-10-CM

## 2017-10-13 DIAGNOSIS — M1990 Unspecified osteoarthritis, unspecified site: Secondary | ICD-10-CM

## 2017-10-13 MED ORDER — CERTOLIZUMAB PEGOL 2 X 200 MG ~~LOC~~ KIT
400.0000 mg | PACK | SUBCUTANEOUS | Status: DC
  Administered 2017-10-13: 10:00:00 400 mg via SUBCUTANEOUS
  Filled 2017-10-13 (×2): qty 400

## 2017-10-14 LAB — CBC WITH DIFFERENTIAL/PLATELET
Basophils Absolute: 21 {cells}/uL (ref 0–200)
Basophils Relative: 0.3 %
Eosinophils Absolute: 178 {cells}/uL (ref 15–500)
Eosinophils Relative: 2.5 %
HCT: 42.9 % (ref 38.5–50.0)
Hemoglobin: 14.7 g/dL (ref 13.2–17.1)
Lymphs Abs: 2386 {cells}/uL (ref 850–3900)
MCH: 31.6 pg (ref 27.0–33.0)
MCHC: 34.3 g/dL (ref 32.0–36.0)
MCV: 92.3 fL (ref 80.0–100.0)
MPV: 10.8 fL (ref 7.5–12.5)
Monocytes Relative: 10.7 %
Neutro Abs: 3756 {cells}/uL (ref 1500–7800)
Neutrophils Relative %: 52.9 %
Platelets: 270 Thousand/uL (ref 140–400)
RBC: 4.65 Million/uL (ref 4.20–5.80)
RDW: 14.4 % (ref 11.0–15.0)
Total Lymphocyte: 33.6 %
WBC mixed population: 760 {cells}/uL (ref 200–950)
WBC: 7.1 Thousand/uL (ref 3.8–10.8)

## 2017-10-14 LAB — COMPLETE METABOLIC PANEL WITHOUT GFR
AG Ratio: 1.6 (calc) (ref 1.0–2.5)
ALT: 42 U/L (ref 9–46)
AST: 32 U/L (ref 10–35)
Albumin: 4.3 g/dL (ref 3.6–5.1)
Alkaline phosphatase (APISO): 52 U/L (ref 40–115)
BUN: 13 mg/dL (ref 7–25)
CO2: 28 mmol/L (ref 20–32)
Calcium: 9.4 mg/dL (ref 8.6–10.3)
Chloride: 103 mmol/L (ref 98–110)
Creat: 0.91 mg/dL (ref 0.70–1.25)
GFR, Est African American: 101 mL/min/1.73m2
GFR, Est Non African American: 87 mL/min/1.73m2
Globulin: 2.7 g/dL (ref 1.9–3.7)
Glucose, Bld: 99 mg/dL (ref 65–99)
Potassium: 4.7 mmol/L (ref 3.5–5.3)
Sodium: 139 mmol/L (ref 135–146)
Total Bilirubin: 0.6 mg/dL (ref 0.2–1.2)
Total Protein: 7 g/dL (ref 6.1–8.1)

## 2017-10-14 LAB — URIC ACID: Uric Acid, Serum: 3.7 mg/dL — ABNORMAL LOW (ref 4.0–8.0)

## 2017-10-14 NOTE — Progress Notes (Signed)
stable °

## 2017-10-27 NOTE — Progress Notes (Signed)
Office Visit Note  Patient: Christopher Smith             Date of Birth: [birth year]           MRN: [id number]             PCP: Thressa Sheller, MD Referring: Thressa Sheller, MD Visit Date: 11/10/2017 Occupation: @GUAROCC @    Subjective:  Medication Management   History of Present Illness: Christopher Smith is a 67 y.o. male with history of inflammatory arthritis, ulcerative colitis and gout.  He denies any joint swelling but he continues to have pain and discomfort in his multiple joints.  He complains of pain and stiffness in his C-spine and lumbar spine.  He also describes pain and discomfort in his bilateral thumb and DIP joints.  He states the pain usually gets worse during the wintertime.  He states overall it is much better since he has started Cimzia and methotrexate combination.  Activities of Daily Living:  Patient reports morning stiffness for 10 minutes.   Patient Denies nocturnal pain.  Difficulty dressing/grooming: Denies Difficulty climbing stairs: Reports Difficulty getting out of chair: Reports Difficulty using hands for taps, buttons, cutlery, and/or writing: Denies   Review of Systems  Constitutional: Negative for fatigue and night sweats.  HENT: Negative for mouth sores, mouth dryness and nose dryness.   Eyes: Negative for redness and dryness.  Respiratory: Negative for shortness of breath and difficulty breathing.   Cardiovascular: Negative for chest pain, palpitations, hypertension, irregular heartbeat and swelling in legs/feet.  Gastrointestinal: Positive for diarrhea. Negative for constipation.  Endocrine: Negative for increased urination.  Musculoskeletal: Positive for arthralgias, joint pain and morning stiffness. Negative for joint swelling, myalgias, muscle weakness, muscle tenderness and myalgias.  Skin: Negative for color change, rash, hair loss, nodules/bumps, skin tightness, ulcers and sensitivity to sunlight.  Allergic/Immunologic: Negative for  susceptible to infections.  Neurological: Negative for dizziness, fainting, memory loss, night sweats and weakness ( ).  Hematological: Negative for swollen glands.  Psychiatric/Behavioral: Negative for depressed mood and sleep disturbance. The patient is not nervous/anxious.     PMFS History:  Patient Active Problem List   Diagnosis Date Noted  . Chronic inflammatory arthritis 08/05/2016  . High risk medication use 08/05/2016  . Contracture of elbow, right 08/05/2016  . History of total knee replacement, bilateral 08/05/2016  . 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 History of bilateral rotator cuff tear repair 08/05/2016  . Gastroesophageal reflux  08/05/2016  . Latent tuberculosis by blood test 08/05/2016  . Ulcerative colitis 09/03/2011  . Glucose intolerance (impaired glucose tolerance) 09/03/2011  . Severe diarrhea 09/03/2011  . URI (upper respiratory infection) 07/02/2011  . Bile salt-induced diarrhea 07/02/2011  . S/P cholecystectomy 07/02/2011  . Ulcerative colitis, unspecified 04/03/2011  . Benign neoplasm of colon 04/03/2011  . Positive TB test 03/22/2011  . Insomnia due to medical condition 03/22/2011  . UC (ulcerative colitis) ([place]) 03/01/2011  . Leg cramps 03/01/2011  . Ulcerative colitis with rectal bleeding ([place]) 01/24/2011  . Nonspecific abnormal finding in stool contents 01/24/2011  . Heme positive stool 01/11/2011  . Diarrhea 01/11/2011  . Arthritis associated with inflammatory bowel disease 01/11/2011  . Intentional weight loss 01/11/2011    Past Medical History:  Diagnosis Date  . Anal fissure   . Arthritis    RA  . Blood transfusion without reported diagnosis   . Diabetes mellitus    no meds taken 05-01-16  . Diarrhea   .  Fatty liver   . GERD (gastroesophageal reflux disease)   . Gout   . Hiatal hernia   . Internal hemorrhoids   . Polyarthritis   . Sessile rectal polyp 03/27/2011  . Sinusitis   . Tuberculosis    positive  PPD- had an allergic reaction to the PPD, gets yearly chest xray  . Tubular adenoma of colon   . Ulcerative colitis 01/24/2011    Family History  Problem Relation Age of Onset  . Alzheimer's disease Father   . Stroke Mother   . Cancer Mother        liver & breast   . Rheum arthritis Brother   . Rheum arthritis Brother   . Rheum arthritis Sister   . Rheum arthritis Sister   . Rheum arthritis Sister   . Colon cancer Neg Hx   . Esophageal cancer Neg Hx   . Stomach cancer Neg Hx   . Rectal cancer Neg Hx    Past Surgical History:  Procedure Laterality Date  . CHOLECYSTECTOMY    . COLONOSCOPY    . JOINT REPLACEMENT     BIL knees  . KNEE SURGERY     Bilateral   . SHOULDER SURGERY     Bilateral    Social History   Social History Narrative   1 caffeine drink daily      Objective: Vital Signs: BP 123/71   Pulse (!) 58   Resp 17   Ht 5' 11"  (1.803 m)   Wt 228 lb (103.4 kg)   BMI 31.80 kg/m    Physical Exam  Constitutional: He is oriented to person, place, and time. He appears well-developed and well-nourished.  HENT:  Head: Normocephalic and atraumatic.  Eyes: Pupils are equal, round, and reactive to light. Conjunctivae and EOM are normal.  Neck: Normal range of motion. Neck supple.  Cardiovascular: Normal rate, regular rhythm and normal heart sounds.  Pulmonary/Chest: Effort normal and breath sounds normal.  Abdominal: Soft. Bowel sounds are normal.  Neurological: He is alert and oriented to person, place, and time.  Skin: Skin is warm and dry. Capillary refill takes less than 2 seconds.  Psychiatric: He has a normal mood and affect. His behavior is normal.  Nursing note and vitals reviewed.    Musculoskeletal Exam: C-spine thoracic lumbar spine good range of motion.  Shoulder joints elbow joints wrist joint MCPs PIPs DIPs with good range of motion.  He has tenderness over bilateral CMC joints.  He has some prominence of bilateral PIP/DIP joints with no synovitis.   Hip joints knee joints ankles MTPs PIPs been good range of motion.  He has some warmth over bilateral knee joints which have been replaced.    CDAI Exam: CDAI Homunculus Exam:   Joint Counts:  CDAI Tender Joint count: 0 CDAI Swollen Joint count: 0  Global Assessments:  Patient Global Assessment: 4 Provider Global Assessment: 2  CDAI Calculated Score: 6    Investigation: No additional findings.Uric acid: 10/13/2017 3.7 CBC Latest Ref Rng & Units 10/13/2017 06/10/2017 11/19/2016  WBC 3.8 - 10.8 Thousand/uL 7.1 8.3 7.9  Hemoglobin 13.2 - 17.1 g/dL 14.7 15.1 14.9  Hematocrit 38.5 - 50.0 % 42.9 44.1 42.7  Platelets 140 - 400 Thousand/uL 270 270 255   CMP Latest Ref Rng & Units 10/13/2017 06/10/2017 11/19/2016  Glucose 65 - 99 mg/dL 99 97 107(H)  BUN 7 - 25 mg/dL 13 15 14   Creatinine 0.70 - 1.25 mg/dL 0.91 1.00 1.08  Sodium 135 - 146  mmol/L 139 139 141  Potassium 3.5 - 5.3 mmol/L 4.7 4.9 5.2  Chloride 98 - 110 mmol/L 103 102 100  CO2 20 - 32 mmol/L 28 30 25   Calcium 8.6 - 10.3 mg/dL 9.4 9.7 9.5  Total Protein 6.1 - 8.1 g/dL 7.0 7.5 7.1  Total Bilirubin 0.2 - 1.2 mg/dL 0.6 0.9 0.5  Alkaline Phos 39 - 117 IU/L - - 65  AST 10 - 35 U/L 32 38(H) 38  ALT 9 - 46 U/L 42 42 48(H)    Imaging: No results found.  Speciality Comments: No specialty comments available.    Procedures:  No procedures performed Allergies: Patient has no known allergies.   Assessment / Plan:     Visit Diagnoses: Seronegative arthritis - inflammatory, erosive.  He has no active synovitis on examination.  Although he continues to have some arthralgias.  I believe most the pain is related to underlying osteoarthritis.  High risk medication use - Cimzia, MTX, folic acid ( patient has failed him Humira and Remicade in the past. He states that Humira controlled his arthritis but not ulcerative colitis) his labs have been stable.  We will continue to monitor his labs every 3 months.  Idiopathic chronic gout of  multiple sites without tophus - allopurinol, mitigare.Uric acid: 10/13/2017 3.7  Other ulcerative colitis with other complication (HCC)-he continues to have some bouts of diarrhea.  Which may be related to cholecystectomy.  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  History of total bilateral knee replacement - Dr. Ronnie Derby.  He continues to have some warmth and discomfort.  History of bilateral rotator cuff tear repair-he has chronic pain.  Positive TB test - 2011.  His last chest x-ray was September 15, 2017.  History of gastroesophageal reflux (GERD)  History of cholecystectomy    Orders: No orders of the defined types were placed in this encounter.  Meds ordered this encounter  Medications  . MITIGARE 0.6 MG CAPS    Sig: TAKE 1 CAPSULE BY MOUTH EACH DAY    Dispense:  30 capsule    Refill:  5  . folic acid (FOLVITE) 1 MG tablet    Sig: Take 2 tablets (2 mg total) by mouth daily.    Dispense:  180 tablet    Refill:  3    Face-to-face time spent with patient was 30 minutes. >50% of time was spent in counseling and coordination of care.  Follow-Up Instructions: Return in about 5 months (around 04/12/2018) for Ulcerative colitis, gout.   Bo Merino, MD  Note - This record has been created using Editor, commissioning.  Chart creation errors have been sought, but may not always  have been located. Such creation errors do not reflect on  the standard of medical care.

## 2017-11-10 ENCOUNTER — Ambulatory Visit (INDEPENDENT_AMBULATORY_CARE_PROVIDER_SITE_OTHER): Payer: Medicare Other | Admitting: Rheumatology

## 2017-11-10 ENCOUNTER — Encounter: Payer: Self-pay | Admitting: Rheumatology

## 2017-11-10 ENCOUNTER — Ambulatory Visit (HOSPITAL_COMMUNITY)
Admission: RE | Admit: 2017-11-10 | Discharge: 2017-11-10 | Disposition: A | Discharge status: Home / Self Care | Payer: Medicare Other | Source: Ambulatory Visit | Attending: Rheumatology | Admitting: Rheumatology

## 2017-11-10 VITALS — BP 123/71 | HR 58 | Resp 17 | Ht 71.0 in | Wt 228.0 lb

## 2017-11-10 DIAGNOSIS — Z9049 Acquired absence of other specified parts of digestive tract: Secondary | ICD-10-CM | POA: Diagnosis not present

## 2017-11-10 DIAGNOSIS — K51818 Other ulcerative colitis with other complication: Secondary | ICD-10-CM

## 2017-11-10 DIAGNOSIS — Z96653 Presence of artificial knee joint, bilateral: Secondary | ICD-10-CM

## 2017-11-10 DIAGNOSIS — M24521 Contracture, right elbow: Secondary | ICD-10-CM | POA: Diagnosis not present

## 2017-11-10 DIAGNOSIS — M1A09X0 Idiopathic chronic gout, multiple sites, without tophus (tophi): Secondary | ICD-10-CM

## 2017-11-10 DIAGNOSIS — M12819 Other specific arthropathies, not elsewhere classified, unspecified shoulder: Secondary | ICD-10-CM

## 2017-11-10 DIAGNOSIS — M0600 Rheumatoid arthritis without rheumatoid factor, unspecified site: Secondary | ICD-10-CM | POA: Diagnosis not present

## 2017-11-10 DIAGNOSIS — Z79899 Other long term (current) drug therapy: Secondary | ICD-10-CM | POA: Diagnosis not present

## 2017-11-10 DIAGNOSIS — R7611 Nonspecific reaction to tuberculin skin test without active tuberculosis: Secondary | ICD-10-CM | POA: Diagnosis not present

## 2017-11-10 DIAGNOSIS — Z8719 Personal history of other diseases of the digestive system: Secondary | ICD-10-CM

## 2017-11-10 DIAGNOSIS — M1380 Other specified arthritis, unspecified site: Secondary | ICD-10-CM

## 2017-11-10 DIAGNOSIS — M1990 Unspecified osteoarthritis, unspecified site: Secondary | ICD-10-CM

## 2017-11-10 DIAGNOSIS — M1A0790 Idiopathic chronic gout, unspecified ankle and foot, without tophus (tophi): Secondary | ICD-10-CM | POA: Insufficient documentation

## 2017-11-10 MED ORDER — FOLIC ACID 1 MG PO TABS: 2.0000 mg | ORAL_TABLET | Freq: Every day | ORAL | 3 refills | Status: DC

## 2017-11-10 MED ORDER — MITIGARE 0.6 MG PO CAPS: ORAL_CAPSULE | ORAL | 5 refills | Status: DC

## 2017-11-10 MED ORDER — CERTOLIZUMAB PEGOL 2 X 200 MG ~~LOC~~ KIT
400.0000 mg | PACK | SUBCUTANEOUS | Status: DC
  Administered 2017-11-10: 400 mg via SUBCUTANEOUS
  Filled 2017-11-10: qty 400

## 2017-11-19 DIAGNOSIS — Z23 Encounter for immunization: Secondary | ICD-10-CM | POA: Diagnosis not present

## 2017-11-19 DIAGNOSIS — S62631B Displaced fracture of distal phalanx of left index finger, initial encounter for open fracture: Secondary | ICD-10-CM | POA: Diagnosis not present

## 2017-11-19 DIAGNOSIS — W268XXA Contact with other sharp object(s), not elsewhere classified, initial encounter: Secondary | ICD-10-CM | POA: Diagnosis not present

## 2017-11-19 DIAGNOSIS — S62522A Displaced fracture of distal phalanx of left thumb, initial encounter for closed fracture: Secondary | ICD-10-CM | POA: Diagnosis not present

## 2017-11-19 DIAGNOSIS — M6789 Other specified disorders of synovium and tendon, multiple sites: Secondary | ICD-10-CM | POA: Diagnosis not present

## 2017-11-19 DIAGNOSIS — S61311A Laceration without foreign body of left index finger with damage to nail, initial encounter: Secondary | ICD-10-CM | POA: Diagnosis not present

## 2017-11-19 DIAGNOSIS — Y999 Unspecified external cause status: Secondary | ICD-10-CM | POA: Diagnosis not present

## 2017-11-21 ENCOUNTER — Encounter (INDEPENDENT_AMBULATORY_CARE_PROVIDER_SITE_OTHER): Payer: Self-pay | Admitting: Orthopaedic Surgery

## 2017-11-21 ENCOUNTER — Ambulatory Visit (INDEPENDENT_AMBULATORY_CARE_PROVIDER_SITE_OTHER): Payer: Medicare Other | Admitting: Orthopaedic Surgery

## 2017-11-21 VITALS — BP 156/78 | HR 55 | Resp 16 | Ht 71.0 in | Wt 224.0 lb

## 2017-11-21 DIAGNOSIS — S62601B Fracture of unspecified phalanx of left index finger, initial encounter for open fracture: Secondary | ICD-10-CM | POA: Diagnosis not present

## 2017-11-21 DIAGNOSIS — S62609B Fracture of unspecified phalanx of unspecified finger, initial encounter for open fracture: Secondary | ICD-10-CM

## 2017-11-21 NOTE — Progress Notes (Signed)
Office Visit Note   Patient: Christopher Smith           Date of Birth: 17-Jan-1951           MRN: [id number] Visit Date: 11/21/2017              Requested by: Thressa Sheller, [address], [place], [place] [address] PCP: Thressa Sheller, MD   Assessment & Plan: Visit Diagnoses:  1. Open fracture of phalanx of digit of hand, initial encounter     Plan: Laceration dorsum left index finger associated with an open fracture at the base of the distal phalanx initial treatment in the emergency room at [facility] with closure of the wound.  Patient is on antibiotics.  At this point wound is clean and dry.  Redressed the wound continue antibiotics.  We will see in the office first of the week.  Will splint to allow full extension of the DIP joint  Follow-Up Instructions: Return in about 1 week (around 11/28/2017).   Orders:  No orders of the defined types were placed in this encounter.  No orders of the defined types were placed in this encounter.     Procedures: No procedures performed   Clinical Data: No additional findings.   Subjective: Chief Complaint  Patient presents with  . Left Hand - Injury  . New Patient (Initial Visit)    CUT L INDEX FINGER 2 DAYS AGO, POSIBLE CUT TENDON  Christopher Smith is 67 years old and is here for evaluation of injuries sustained to his left index finger.  2 days ago he was working with a saw at home and sustained a laceration to the dorsal aspect of the left index finger.  He was seen in the emergency room.  The laceration was cleaned and sutured.  He was placed on antibiotics.  X-rays accompany him on a disc and demonstrate a fracture of the dorsal aspect of the distal phalanx. and represents an avulsion fracture of the extensor tendon.  He is not experiencing any pain.  He does have pre-existing arthritis of the DIP joint with some "deformity".  HPI  Review of Systems  Constitutional: Negative for fatigue and fever.  HENT:  Negative for ear pain.   Eyes: Negative for pain.  Respiratory: Negative for cough and shortness of breath.   Cardiovascular: Negative for leg swelling.  Gastrointestinal: Positive for constipation and diarrhea.  Genitourinary: Negative for difficulty urinating.  Musculoskeletal: Positive for back pain and neck pain.  Skin: Negative for rash.  Allergic/Immunologic: Negative for food allergies.  Neurological: Negative for weakness and numbness.  Hematological: Does not bruise/bleed easily.  Psychiatric/Behavioral: Negative for sleep disturbance.     Objective: Vital Signs: BP (!) 156/78 (BP Location: Right Arm, Patient Position: Sitting, Cuff Size: Normal)   Pulse (!) 55   Resp 16   Ht 5' 11"  (1.803 m)   Wt 224 lb (101.6 kg)   BMI 31.24 kg/m   Physical Exam  Constitutional: He is oriented to person, place, and time. He appears well-developed and well-nourished.  HENT:  Mouth/Throat: Oropharynx is clear and moist.  Eyes: Pupils are equal, round, and reactive to light. EOM are normal.  Pulmonary/Chest: Effort normal.  Neurological: He is alert and oriented to person, place, and time.  Skin: Skin is warm and dry.  Psychiatric: He has a normal mood and affect. His behavior is normal.    Ortho Exam awake alert and oriented x3.  Comfortable sitting.  I  remove the dressing from the left index finger.  There is no active drainage.  The laceration is slightly oblique and longitudinal the dorsum of the finger extending across the middle phalanx to the base of the nail.  Good capillary refill to the tip of the finger.  Has normal sensibility.  There is slight flexion across the DIP joint.  No pain or deformity of the PIP joint.  No evidence of infection  Specialty Comments:  No specialty comments available.  Imaging: No results found.   PMFS History: Patient Active Problem List   Diagnosis Date Noted  . Open fracture of one or more phalanges of hand 11/21/2017  . Chronic  inflammatory arthritis 08/05/2016  . High risk medication use 08/05/2016  . Contracture of elbow, right 08/05/2016  . History of total knee replacement, bilateral 08/05/2016  . 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 History of bilateral rotator cuff tear repair 08/05/2016  . Gastroesophageal reflux  08/05/2016  . Latent tuberculosis by blood test 08/05/2016  . Ulcerative colitis 09/03/2011  . Glucose intolerance (impaired glucose tolerance) 09/03/2011  . Severe diarrhea 09/03/2011  . URI (upper respiratory infection) 07/02/2011  . Bile salt-induced diarrhea 07/02/2011  . S/P cholecystectomy 07/02/2011  . Ulcerative colitis, unspecified 04/03/2011  . Benign neoplasm of colon 04/03/2011  . Positive TB test 03/22/2011  . Insomnia due to medical condition 03/22/2011  . UC (ulcerative colitis) ([place]) 03/01/2011  . Leg cramps 03/01/2011  . Ulcerative colitis with rectal bleeding ([place]) 01/24/2011  . Nonspecific abnormal finding in stool contents 01/24/2011  . Heme positive stool 01/11/2011  . Diarrhea 01/11/2011  . Arthritis associated with inflammatory bowel disease 01/11/2011  . Intentional weight loss 01/11/2011   Past Medical History:  Diagnosis Date  . Anal fissure   . Arthritis    RA  . Blood transfusion without reported diagnosis   . Diabetes mellitus    no meds taken 05-01-16  . Diarrhea   . Fatty liver   . GERD (gastroesophageal reflux disease)   . Gout   . Hiatal hernia   . Internal hemorrhoids   . Polyarthritis   . Sessile rectal polyp 03/27/2011  . Sinusitis   . Tuberculosis    positive PPD- had an allergic reaction to the PPD, gets yearly chest xray  . Tubular adenoma of colon   . Ulcerative colitis 01/24/2011    Family History  Problem Relation Age of Onset  . Alzheimer's disease Father   . Stroke Mother   . Cancer Mother        liver & breast   . Rheum arthritis Brother   . Rheum arthritis Brother   . Rheum arthritis Sister   .  Rheum arthritis Sister   . Rheum arthritis Sister   . Colon cancer Neg Hx   . Esophageal cancer Neg Hx   . Stomach cancer Neg Hx   . Rectal cancer Neg Hx     Past Surgical History:  Procedure Laterality Date  . CHOLECYSTECTOMY    . COLONOSCOPY    . JOINT REPLACEMENT     BIL knees  . KNEE SURGERY     Bilateral   . SHOULDER SURGERY     Bilateral    Social History   Occupational History  . Occupation: Arts administrator: [employer]  Tobacco Use  . Smoking status: Former Smoker    Packs/day: 1.00    Last attempt to quit: 07/15/1990  Years since quitting: 27.3  . Smokeless tobacco: Former Systems developer    Types: Chew  Substance and Sexual Activity  . Alcohol use: Yes    Alcohol/week: 3.6 oz    Types: 6 Cans of beer per week    Comment: weekly beer  . Drug use: No  . Sexual activity: Not on file

## 2017-11-25 ENCOUNTER — Encounter (INDEPENDENT_AMBULATORY_CARE_PROVIDER_SITE_OTHER): Payer: Self-pay | Admitting: Orthopaedic Surgery

## 2017-11-25 ENCOUNTER — Ambulatory Visit (INDEPENDENT_AMBULATORY_CARE_PROVIDER_SITE_OTHER): Payer: Medicare Other | Admitting: Orthopaedic Surgery

## 2017-11-25 VITALS — BP 134/76 | HR 55 | Resp 18 | Ht 71.0 in | Wt 224.0 lb

## 2017-11-25 DIAGNOSIS — M79642 Pain in left hand: Secondary | ICD-10-CM | POA: Diagnosis not present

## 2017-11-25 NOTE — Progress Notes (Signed)
Office Visit Note   Patient: Christopher Smith           Date of Birth: [birth year]           MRN: [id number] Visit Date: 11/25/2017              Requested by: Thressa Sheller, [place], [place], [place] [address] PCP: Thressa Sheller, MD   Assessment & Plan: Visit Diagnoses:  1. Pain of left hand     Plan: Open fracture left index finger with laceration wound is healing without problem.  He will continue local dressing with a dorsal splint to maintain extension of the DIP joint.  We will see back in 2 days and consider removing stitches  Follow-Up Instructions: Return in about 2 days (around 11/27/2017).   Orders:  No orders of the defined types were placed in this encounter.  No orders of the defined types were placed in this encounter.     Procedures: No procedures performed   Clinical Data: No additional findings.   Subjective: Chief Complaint  Patient presents with  . Left Hand - Pain  . Follow-up    L INDEX FINGER INJURY HAD STITCHES AND HAS BEEN TAKING ANTIBIOTIC  6 days status post injury to the left index finger at the level of the DIP joint with open fracture at the base of the distal sutured in the emergency room.  No appreciable pain.  HPI  Review of Systems  Constitutional: Negative for fatigue and fever.  HENT: Negative for ear pain.   Eyes: Negative for pain.  Respiratory: Negative for cough and shortness of breath.   Cardiovascular: Negative for leg swelling.  Gastrointestinal: Negative for constipation and diarrhea.  Genitourinary: Negative for difficulty urinating.  Musculoskeletal: Negative for back pain and neck pain.  Skin: Negative for rash.  Allergic/Immunologic: Negative for food allergies.  Neurological: Positive for weakness. Negative for numbness.  Hematological: Bruises/bleeds easily.  Psychiatric/Behavioral: Negative for sleep disturbance.     Objective: Vital Signs: BP 134/76 (BP Location: Left Arm,  Patient Position: Sitting, Cuff Size: Normal)   Pulse (!) 55   Resp 18   Ht 5' 11"  (1.803 m)   Wt 224 lb (101.6 kg)   BMI 31.24 kg/m   Physical Exam  Ortho Exam awake alert and oriented x3.  Comfortable sitting.  No acute distress.  No difficulty breathing.  Very appropriate responses.  I remove the dressing from the left index finger.  The dorsal laceration is healing without evidence of infection the.  There is no drainage or erythema.  There is a slight extension lag across the DIP joint but it is very minimal.  Good sensibility the tip of the finger.  I will reapply the splint and applied mupropicin.  Specialty Comments:  No specialty comments available.  Imaging: No results found.   PMFS History: Patient Active Problem List   Diagnosis Date Noted  . Open fracture of one or more phalanges of hand 11/21/2017  . Chronic inflammatory arthritis 08/05/2016  . High risk medication use 08/05/2016  . Contracture of elbow, right 08/05/2016  . History of total knee replacement, bilateral 08/05/2016  . 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 History of bilateral rotator cuff tear repair 08/05/2016  . Gastroesophageal reflux  08/05/2016  . Latent tuberculosis by blood test 08/05/2016  . Ulcerative colitis 09/03/2011  . Glucose intolerance (impaired glucose tolerance) 09/03/2011  . Severe diarrhea 09/03/2011  . URI (upper respiratory  infection) 07/02/2011  . Bile salt-induced diarrhea 07/02/2011  . S/P cholecystectomy 07/02/2011  . Ulcerative colitis, unspecified 04/03/2011  . Benign neoplasm of colon 04/03/2011  . Positive TB test 03/22/2011  . Insomnia due to medical condition 03/22/2011  . UC (ulcerative colitis) ([place]) 03/01/2011  . Leg cramps 03/01/2011  . Ulcerative colitis with rectal bleeding ([place]) 01/24/2011  . Nonspecific abnormal finding in stool contents 01/24/2011  . Heme positive stool 01/11/2011  . Diarrhea 01/11/2011  . Arthritis associated  with inflammatory bowel disease 01/11/2011  . Intentional weight loss 01/11/2011   Past Medical History:  Diagnosis Date  . Anal fissure   . Arthritis    RA  . Blood transfusion without reported diagnosis   . Diabetes mellitus    no meds taken 05-01-16  . Diarrhea   . Fatty liver   . GERD (gastroesophageal reflux disease)   . Gout   . Hiatal hernia   . Internal hemorrhoids   . Polyarthritis   . Sessile rectal polyp 03/27/2011  . Sinusitis   . Tuberculosis    positive PPD- had an allergic reaction to the PPD, gets yearly chest xray  . Tubular adenoma of colon   . Ulcerative colitis 01/24/2011    Family History  Problem Relation Age of Onset  . Alzheimer's disease Father   . Stroke Mother   . Cancer Mother        liver & breast   . Rheum arthritis Brother   . Rheum arthritis Brother   . Rheum arthritis Sister   . Rheum arthritis Sister   . Rheum arthritis Sister   . Colon cancer Neg Hx   . Esophageal cancer Neg Hx   . Stomach cancer Neg Hx   . Rectal cancer Neg Hx     Past Surgical History:  Procedure Laterality Date  . CHOLECYSTECTOMY    . COLONOSCOPY    . JOINT REPLACEMENT     BIL knees  . KNEE SURGERY     Bilateral   . SHOULDER SURGERY     Bilateral    Social History   Occupational History  . Occupation: Arts administrator: [employer]  Tobacco Use  . Smoking status: Former Smoker    Packs/day: 1.00    Last attempt to quit: 07/15/1990    Years since quitting: 27.3  . Smokeless tobacco: Former Systems developer    Types: Chew  Substance and Sexual Activity  . Alcohol use: Yes    Alcohol/week: 3.6 oz    Types: 6 Cans of beer per week    Comment: weekly beer  . Drug use: No  . Sexual activity: Not on file

## 2017-11-27 ENCOUNTER — Ambulatory Visit (INDEPENDENT_AMBULATORY_CARE_PROVIDER_SITE_OTHER): Payer: Medicare Other | Admitting: Orthopaedic Surgery

## 2017-11-27 ENCOUNTER — Encounter (INDEPENDENT_AMBULATORY_CARE_PROVIDER_SITE_OTHER): Payer: Self-pay | Admitting: Orthopaedic Surgery

## 2017-11-27 DIAGNOSIS — S62609D Fracture of unspecified phalanx of unspecified finger, subsequent encounter for fracture with routine healing: Secondary | ICD-10-CM

## 2017-11-27 NOTE — Progress Notes (Signed)
Office Visit Note   Patient: Christopher Smith           Date of Birth: 1950-12-02           MRN: [id number] Visit Date: 11/27/2017              Requested by: Thressa Sheller, [place], [place] [place], [place] [address] PCP: Thressa Sheller, MD   Assessment & Plan: Visit Diagnoses:  1. Open fracture of phalanx of digit of hand with routine healing, subsequent encounter     Plan: I will remove stitches from the left index finger and continue with splint immobilization for up to 6 weeks with the DIP joint in extension.  No evidence of infection.  Good capillary refill to the tip of the finger at office 2 weeks  Follow-Up Instructions: Return in about 2 weeks (around 12/11/2017).   Orders:  No orders of the defined types were placed in this encounter.  No orders of the defined types were placed in this encounter.     Procedures: No procedures performed   Clinical Data: No additional findings.   Subjective: No chief complaint on file. 8 days status post laceration of the dorsum of the left index finger associated with an open fracture at the base of the distal phalanx.  Patient has a mallet finger with slight displacement of the dorsal fracture to the extensor tendon.  Doing well without any pain.  We will plan on removing the stitches today  HPI  Review of Systems   Objective: Vital Signs: There were no vitals taken for this visit.  Physical Exam  Ortho Exam wound to the left index finger is healing without evidence of infection.  I did remove the 4-0 Ethilon sutures.  I reapplied the splint with the DIP joint in extension.  There is an extensor lag.  Patient aware this and is not interested and surgery  Specialty Comments:  No specialty comments available.  Imaging: No results found.   PMFS History: Patient Active Problem List   Diagnosis Date Noted  . Open fracture of one or more phalanges of hand 11/21/2017  . Chronic inflammatory  arthritis 08/05/2016  . High risk medication use 08/05/2016  . Contracture of elbow, right 08/05/2016  . History of total knee replacement, bilateral 08/05/2016  . 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 History of bilateral rotator cuff tear repair 08/05/2016  . Gastroesophageal reflux  08/05/2016  . Latent tuberculosis by blood test 08/05/2016  . Ulcerative colitis 09/03/2011  . Glucose intolerance (impaired glucose tolerance) 09/03/2011  . Severe diarrhea 09/03/2011  . URI (upper respiratory infection) 07/02/2011  . Bile salt-induced diarrhea 07/02/2011  . S/P cholecystectomy 07/02/2011  . Ulcerative colitis, unspecified 04/03/2011  . Benign neoplasm of colon 04/03/2011  . Positive TB test 03/22/2011  . Insomnia due to medical condition 03/22/2011  . UC (ulcerative colitis) ([facility]) 03/01/2011  . Leg cramps 03/01/2011  . Ulcerative colitis with rectal bleeding ([facility]) 01/24/2011  . Nonspecific abnormal finding in stool contents 01/24/2011  . Heme positive stool 01/11/2011  . Diarrhea 01/11/2011  . Arthritis associated with inflammatory bowel disease 01/11/2011  . Intentional weight loss 01/11/2011   Past Medical History:  Diagnosis Date  . Anal fissure   . Arthritis    RA  . Blood transfusion without reported diagnosis   . Diabetes mellitus    no meds taken 05-01-16  . Diarrhea   . Fatty liver   . GERD (  gastroesophageal reflux disease)   . Gout   . Hiatal hernia   . Internal hemorrhoids   . Polyarthritis   . Sessile rectal polyp 03/27/2011  . Sinusitis   . Tuberculosis    positive PPD- had an allergic reaction to the PPD, gets yearly chest xray  . Tubular adenoma of colon   . Ulcerative colitis 01/24/2011    Family History  Problem Relation Age of Onset  . Alzheimer's disease Father   . Stroke Mother   . Cancer Mother        liver & breast   . Rheum arthritis Brother   . Rheum arthritis Brother   . Rheum arthritis Sister   . Rheum arthritis  Sister   . Rheum arthritis Sister   . Colon cancer Neg Hx   . Esophageal cancer Neg Hx   . Stomach cancer Neg Hx   . Rectal cancer Neg Hx     Past Surgical History:  Procedure Laterality Date  . CHOLECYSTECTOMY    . COLONOSCOPY    . JOINT REPLACEMENT     BIL knees  . KNEE SURGERY     Bilateral   . SHOULDER SURGERY     Bilateral    Social History   Occupational History  . Occupation: Arts administrator: [employer]  Tobacco Use  . Smoking status: Former Smoker    Packs/day: 1.00    Last attempt to quit: 07/15/1990    Years since quitting: 27.3  . Smokeless tobacco: Former Systems developer    Types: Chew  Substance and Sexual Activity  . Alcohol use: Yes    Alcohol/week: 3.6 oz    Types: 6 Cans of beer per week    Comment: weekly beer  . Drug use: No  . Sexual activity: Not on file     Garald Balding, MD   Note - This record has been created using [employer].  Chart creation errors have been sought, but may not always  have been located. Such creation errors do not reflect on  the standard of medical care.

## 2017-12-01 ENCOUNTER — Other Ambulatory Visit: Payer: Self-pay | Admitting: *Deleted

## 2017-12-01 NOTE — Progress Notes (Signed)
Infusion orders are current for patient CBC CMP Tylenol Benadryl appointments are up to date and follow up appointment  is scheduled TB gold not due yet.  

## 2017-12-03 ENCOUNTER — Encounter: Payer: Self-pay | Admitting: Internal Medicine

## 2017-12-08 ENCOUNTER — Other Ambulatory Visit: Payer: Self-pay | Admitting: Internal Medicine

## 2017-12-08 ENCOUNTER — Other Ambulatory Visit: Payer: Self-pay | Admitting: Rheumatology

## 2017-12-08 DIAGNOSIS — K219 Gastro-esophageal reflux disease without esophagitis: Secondary | ICD-10-CM

## 2017-12-09 ENCOUNTER — Telehealth: Payer: Self-pay

## 2017-12-09 ENCOUNTER — Ambulatory Visit (HOSPITAL_COMMUNITY)
Admission: RE | Admit: 2017-12-09 | Discharge: 2017-12-09 | Disposition: A | Discharge status: Home / Self Care | Payer: Medicare Other | Source: Ambulatory Visit | Attending: Rheumatology | Admitting: Rheumatology

## 2017-12-09 DIAGNOSIS — M1990 Unspecified osteoarthritis, unspecified site: Secondary | ICD-10-CM | POA: Diagnosis not present

## 2017-12-09 MED ORDER — CERTOLIZUMAB PEGOL 2 X 200 MG ~~LOC~~ KIT
400.0000 mg | PACK | SUBCUTANEOUS | Status: DC
  Administered 2017-12-09: 09:00:00 400 mg via SUBCUTANEOUS
  Filled 2017-12-09 (×2): qty 400

## 2017-12-09 NOTE — Telephone Encounter (Signed)
Last visit: 11/10/2017 Next visit: 04/21/2018 Labs: 10/13/2017 stable   Okay to refill per Dr. Estanislado Pandy.

## 2017-12-09 NOTE — Telephone Encounter (Signed)
Received a prior authorization request for MTX 51m/2ml vials from [facility]. Authorization has been submitted to pts insurance via cover my meds.   Received notification from [facility] that authorization has been approved from 09/10/2017 to 12/09/2018  Reference number: [id number]: [phone] Will send document to scan center  HDemetrios Loll CPhT 3:51 PM

## 2017-12-15 ENCOUNTER — Ambulatory Visit (INDEPENDENT_AMBULATORY_CARE_PROVIDER_SITE_OTHER): Payer: Medicare Other | Admitting: Orthopaedic Surgery

## 2017-12-15 ENCOUNTER — Encounter (INDEPENDENT_AMBULATORY_CARE_PROVIDER_SITE_OTHER): Payer: Self-pay | Admitting: Orthopaedic Surgery

## 2017-12-15 DIAGNOSIS — S62609D Fracture of unspecified phalanx of unspecified finger, subsequent encounter for fracture with routine healing: Secondary | ICD-10-CM

## 2017-12-15 NOTE — Progress Notes (Signed)
Office Visit Note   Patient: Christopher Smith           Date of Birth: 04-Aug-1950           MRN: [id number] Visit Date: 12/15/2017              Requested by: Thressa Sheller, [place], [place], [place] [address] PCP: Thressa Sheller, MD   Assessment & Plan: Visit Diagnoses:  1. Open fracture of phalanx of digit of hand with routine healing, subsequent encounter     Plan: Stack splint to left index finger DIP joint, office 1 month  Follow-Up Instructions: No follow-ups on file.   Orders:  No orders of the defined types were placed in this encounter.  No orders of the defined types were placed in this encounter.     Procedures: No procedures performed   Clinical Data: No additional findings.   Subjective: No chief complaint on file. Christopher Smith is 3 weeks status post open fracture dorsal aspect of the DIP joint left index finger.  No evidence of infection.  He did have a fracture at the base of the distal phalanx dorsally with the attached extensor tendon.  He now has a mild mallet finger.  We have discussed the pros and cons of operating.  I think he would be better served with a mallet deformity than the inability to flex his finger should it become stiff from surgery.  He is diabetic no pain.  HPI  Review of Systems   Objective: Vital Signs: There were no vitals taken for this visit.  Physical Exam  Ortho Exam awake alert and oriented x3.  Comfortable sitting.  There was some swelling along the distal portion of the phalanx and the base of the distal phalanx.  There is some abnormality of the he has some very minimally altered sensibility of the tip of the finger.  There is a mallet deformity of about 15 degrees which is correctable to neutral.  I did apply a stack splint.  He will wear the splint for a month with no evidence of infection.  Specialty Comments:  No specialty comments available.  Imaging: No results found.   PMFS  History: Patient Active Problem List   Diagnosis Date Noted  . Open fracture of one or more phalanges of hand 11/21/2017  . Chronic inflammatory arthritis 08/05/2016  . High risk medication use 08/05/2016  . Contracture of elbow, right 08/05/2016  . History of total knee replacement, bilateral 08/05/2016  . 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 History of bilateral rotator cuff tear repair 08/05/2016  . Gastroesophageal reflux  08/05/2016  . Latent tuberculosis by blood test 08/05/2016  . Ulcerative colitis 09/03/2011  . Glucose intolerance (impaired glucose tolerance) 09/03/2011  . Severe diarrhea 09/03/2011  . URI (upper respiratory infection) 07/02/2011  . Bile salt-induced diarrhea 07/02/2011  . S/P cholecystectomy 07/02/2011  . Ulcerative colitis, unspecified 04/03/2011  . Benign neoplasm of colon 04/03/2011  . Positive TB test 03/22/2011  . Insomnia due to medical condition 03/22/2011  . UC (ulcerative colitis) ([facility]) 03/01/2011  . Leg cramps 03/01/2011  . Ulcerative colitis with rectal bleeding ([place]) 01/24/2011  . Nonspecific abnormal finding in stool contents 01/24/2011  . Heme positive stool 01/11/2011  . Diarrhea 01/11/2011  . Arthritis associated with inflammatory bowel disease 01/11/2011  . Intentional weight loss 01/11/2011   Past Medical History:  Diagnosis Date  . Anal fissure   . Arthritis  RA  . Blood transfusion without reported diagnosis   . Diabetes mellitus    no meds taken 05-01-16  . Diarrhea   . Fatty liver   . GERD (gastroesophageal reflux disease)   . Gout   . Hiatal hernia   . Internal hemorrhoids   . Polyarthritis   . Sessile rectal polyp 03/27/2011  . Sinusitis   . Tuberculosis    positive PPD- had an allergic reaction to the PPD, gets yearly chest xray  . Tubular adenoma of colon   . Ulcerative colitis 01/24/2011    Family History  Problem Relation Age of Onset  . Alzheimer's disease Father   . Stroke Mother     . Cancer Mother        liver & breast   . Rheum arthritis Brother   . Rheum arthritis Brother   . Rheum arthritis Sister   . Rheum arthritis Sister   . Rheum arthritis Sister   . Colon cancer Neg Hx   . Esophageal cancer Neg Hx   . Stomach cancer Neg Hx   . Rectal cancer Neg Hx     Past Surgical History:  Procedure Laterality Date  . CHOLECYSTECTOMY    . COLONOSCOPY    . JOINT REPLACEMENT     BIL knees  . KNEE SURGERY     Bilateral   . SHOULDER SURGERY     Bilateral    Social History   Occupational History  . Occupation: Arts administrator: [employer]  Tobacco Use  . Smoking status: Former Smoker    Packs/day: 1.00    Last attempt to quit: 07/15/1990    Years since quitting: 27.4  . Smokeless tobacco: Former Systems developer    Types: Chew  Substance and Sexual Activity  . Alcohol use: Yes    Alcohol/week: 3.6 oz    Types: 6 Cans of beer per week    Comment: weekly beer  . Drug use: No  . Sexual activity: Not on file     Christopher Balding, MD   Note - This record has been created using [employer].  Chart creation errors have been sought, but may not always  have been located. Such creation errors do not reflect on  the standard of medical care.

## 2017-12-22 ENCOUNTER — Encounter: Payer: Self-pay | Admitting: Internal Medicine

## 2017-12-30 DIAGNOSIS — E785 Hyperlipidemia, unspecified: Secondary | ICD-10-CM | POA: Diagnosis not present

## 2017-12-30 DIAGNOSIS — Z125 Encounter for screening for malignant neoplasm of prostate: Secondary | ICD-10-CM | POA: Diagnosis not present

## 2017-12-30 DIAGNOSIS — Z0000 Encounter for general adult medical examination without abnormal findings: Secondary | ICD-10-CM | POA: Diagnosis not present

## 2017-12-30 DIAGNOSIS — E1122 Type 2 diabetes mellitus with diabetic chronic kidney disease: Secondary | ICD-10-CM | POA: Diagnosis not present

## 2017-12-30 DIAGNOSIS — M1A9XX0 Chronic gout, unspecified, without tophus (tophi): Secondary | ICD-10-CM | POA: Diagnosis not present

## 2017-12-31 ENCOUNTER — Other Ambulatory Visit: Payer: Self-pay | Admitting: Rheumatology

## 2017-12-31 NOTE — Telephone Encounter (Signed)
Last visit: 11/10/2017 Next visit: 04/21/2018 Labs: 10/13/2017 stable   Okay to refill per Dr. Estanislado Pandy.

## 2018-01-02 ENCOUNTER — Other Ambulatory Visit: Payer: Self-pay | Admitting: *Deleted

## 2018-01-02 ENCOUNTER — Telehealth (INDEPENDENT_AMBULATORY_CARE_PROVIDER_SITE_OTHER): Payer: Self-pay

## 2018-01-02 DIAGNOSIS — M1990 Unspecified osteoarthritis, unspecified site: Secondary | ICD-10-CM

## 2018-01-02 NOTE — Telephone Encounter (Signed)
Orders placed in epic

## 2018-01-02 NOTE — Telephone Encounter (Signed)
Pt is to come in on Monday for an injection (cimzia) and there are no orders in epic. Crystal called and lm yesterday and is unable to reach anyone and phone goes straight to voicemail. I advised that someone would call her today and notify once the orders are in epic cb # [phone]  I am not sure where to send this to ensure that it gets addressed this is a Dr. Estanislado Pandy pt.

## 2018-01-05 ENCOUNTER — Other Ambulatory Visit: Payer: Self-pay | Admitting: *Deleted

## 2018-01-05 ENCOUNTER — Ambulatory Visit (HOSPITAL_COMMUNITY)
Admission: RE | Admit: 2018-01-05 | Discharge: 2018-01-05 | Disposition: A | Discharge status: Home / Self Care | Payer: Medicare Other | Source: Ambulatory Visit | Attending: Rheumatology | Admitting: Rheumatology

## 2018-01-05 ENCOUNTER — Encounter (HOSPITAL_COMMUNITY): Payer: Medicare Other

## 2018-01-05 DIAGNOSIS — E1122 Type 2 diabetes mellitus with diabetic chronic kidney disease: Secondary | ICD-10-CM | POA: Diagnosis not present

## 2018-01-05 DIAGNOSIS — M109 Gout, unspecified: Secondary | ICD-10-CM | POA: Diagnosis not present

## 2018-01-05 DIAGNOSIS — K219 Gastro-esophageal reflux disease without esophagitis: Secondary | ICD-10-CM | POA: Diagnosis not present

## 2018-01-05 DIAGNOSIS — N182 Chronic kidney disease, stage 2 (mild): Secondary | ICD-10-CM | POA: Diagnosis not present

## 2018-01-05 DIAGNOSIS — E785 Hyperlipidemia, unspecified: Secondary | ICD-10-CM | POA: Diagnosis not present

## 2018-01-05 DIAGNOSIS — F3340 Major depressive disorder, recurrent, in remission, unspecified: Secondary | ICD-10-CM | POA: Diagnosis not present

## 2018-01-05 DIAGNOSIS — Z0000 Encounter for general adult medical examination without abnormal findings: Secondary | ICD-10-CM | POA: Diagnosis not present

## 2018-01-05 DIAGNOSIS — K5190 Ulcerative colitis, unspecified, without complications: Secondary | ICD-10-CM | POA: Diagnosis not present

## 2018-01-05 DIAGNOSIS — M1990 Unspecified osteoarthritis, unspecified site: Secondary | ICD-10-CM | POA: Diagnosis not present

## 2018-01-05 DIAGNOSIS — R740 Nonspecific elevation of levels of transaminase and lactic acid dehydrogenase [LDH]: Secondary | ICD-10-CM | POA: Diagnosis not present

## 2018-01-05 MED ORDER — CERTOLIZUMAB PEGOL 2 X 200 MG ~~LOC~~ KIT
200.0000 mg | PACK | SUBCUTANEOUS | Status: DC
  Filled 2018-01-05 (×2): qty 200

## 2018-01-05 MED ORDER — CERTOLIZUMAB PEGOL 2 X 200 MG ~~LOC~~ KIT
400.0000 mg | PACK | SUBCUTANEOUS | Status: DC
  Administered 2018-01-05: 400 mg via SUBCUTANEOUS
  Filled 2018-01-05 (×3): qty 400

## 2018-01-30 ENCOUNTER — Other Ambulatory Visit (HOSPITAL_COMMUNITY): Payer: Self-pay | Admitting: *Deleted

## 2018-02-02 ENCOUNTER — Encounter (HOSPITAL_COMMUNITY)
Admission: RE | Admit: 2018-02-02 | Discharge: 2018-02-02 | Disposition: A | Discharge status: Home / Self Care | Payer: Medicare Other | Source: Ambulatory Visit | Attending: Rheumatology | Admitting: Rheumatology

## 2018-02-02 ENCOUNTER — Other Ambulatory Visit: Payer: Self-pay | Admitting: *Deleted

## 2018-02-02 DIAGNOSIS — M1990 Unspecified osteoarthritis, unspecified site: Secondary | ICD-10-CM | POA: Insufficient documentation

## 2018-02-02 MED ORDER — CERTOLIZUMAB PEGOL 2 X 200 MG ~~LOC~~ KIT
400.0000 mg | PACK | SUBCUTANEOUS | Status: DC
  Administered 2018-02-02: 400 mg via SUBCUTANEOUS
  Filled 2018-02-02: qty 400

## 2018-02-18 ENCOUNTER — Ambulatory Visit (AMBULATORY_SURGERY_CENTER): Payer: Self-pay | Admitting: *Deleted

## 2018-02-18 VITALS — Ht 71.5 in | Wt 226.0 lb

## 2018-02-18 DIAGNOSIS — Z86010 Personal history of colonic polyps: Secondary | ICD-10-CM

## 2018-02-18 MED ORDER — NA SULFATE-K SULFATE-MG SULF 17.5-3.13-1.6 GM/177ML PO SOLN: 1.0000 | Freq: Once | ORAL | 0 refills | Status: AC

## 2018-02-18 NOTE — Progress Notes (Signed)
No egg or soy allergy known to patient  No issues with past sedation with any surgeries  or procedures, no intubation problems  No diet pills per patient No home 02 use per patient  No blood thinners per patient  Pt denies issues with constipation  No A fib or A flutter  EMMI video sent to pt's e mail - pt declined  Per [facility] suprep $ 48.01- pt aware and okay with price

## 2018-02-27 ENCOUNTER — Ambulatory Visit (INDEPENDENT_AMBULATORY_CARE_PROVIDER_SITE_OTHER): Payer: Medicare Other | Admitting: Orthopaedic Surgery

## 2018-02-27 ENCOUNTER — Encounter (INDEPENDENT_AMBULATORY_CARE_PROVIDER_SITE_OTHER): Payer: Self-pay | Admitting: Orthopaedic Surgery

## 2018-02-27 ENCOUNTER — Other Ambulatory Visit: Payer: Self-pay

## 2018-02-27 VITALS — BP 155/79 | HR 54 | Ht 71.0 in | Wt 225.0 lb

## 2018-02-27 DIAGNOSIS — Z79899 Other long term (current) drug therapy: Secondary | ICD-10-CM

## 2018-02-27 DIAGNOSIS — S62609D Fracture of unspecified phalanx of unspecified finger, subsequent encounter for fracture with routine healing: Secondary | ICD-10-CM

## 2018-02-27 DIAGNOSIS — M1A09X0 Idiopathic chronic gout, multiple sites, without tophus (tophi): Secondary | ICD-10-CM

## 2018-02-27 NOTE — Progress Notes (Signed)
Office Visit Note   Patient: Christopher Smith           Date of Birth: [birth year]           MRN: [id number] Visit Date: 02/27/2018              Requested by: Thressa Sheller, [place], [address] [place], [place] [address] PCP: Thressa Sheller, MD   Assessment & Plan: Visit Diagnoses:  1. Open fracture of phalanx of digit of hand with routine healing, subsequent encounter     Plan: Status post open fracture distal phalanx left index finger.  Doing well with the exception of some abnormality of the nail.  Not unhappy with the function.  Some hypertrophic changes about the DIP joint but able to make a full fist.  We will plan to see back on a as needed basis.  He might require some local surgery to eliminate the issue with the with the nail.  He will let me know  Follow-Up Instructions: Return if symptoms worsen or fail to improve.   Orders:  No orders of the defined types were placed in this encounter.  No orders of the defined types were placed in this encounter.     Procedures: No procedures performed   Clinical Data: No additional findings.   Subjective: Chief Complaint  Patient presents with  . Follow-up    L INDEX SURGERY FOLLOW UP, DOING GOOD CAN MAKE FIST BUT FINGERNAIL NOT GROWING BACK  Nearly 3 months status post injury to his left index finger involving an open fracture about the DIP joint.  He is doing well.  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  His only issue was with some abnormality of the nail.  The nail bed was injured and he has had some abnormal growth at the base of the nail which he simply "removed" when it gets "too long".  He has good sensibility to the finger.  HPI  Review of Systems  Constitutional: Negative for fever.  HENT: Negative for ear pain.   Eyes: Negative for pain.  Respiratory: Negative for cough.   Cardiovascular: Negative for leg swelling.  Gastrointestinal: Negative  for constipation and diarrhea.  Genitourinary: Negative for difficulty urinating.  Musculoskeletal: Negative for back pain and neck pain.  Skin: Negative for rash.  Allergic/Immunologic: Negative for food allergies.  Neurological: Positive for weakness. Negative for numbness.  Hematological: Does not bruise/bleed easily.  Psychiatric/Behavioral: Negative for sleep disturbance.     Objective: Vital Signs: BP (!) 155/79 (BP Location: Left Arm, Patient Position: Sitting, Cuff Size: Normal)   Pulse (!) 54   Ht 5' 11"  (1.803 m)   Wt 225 lb (102.1 kg)   BMI 31.38 kg/m   Physical Exam  Constitutional: He is oriented to person, place, and time. He appears well-developed and well-nourished.  HENT:  Mouth/Throat: Oropharynx is clear and moist.  Eyes: Pupils are equal, round, and reactive to light. EOM are normal.  Pulmonary/Chest: Effort normal.  Neurological: He is alert and oriented to person, place, and time.  Skin: Skin is warm and dry.  Psychiatric: He has a normal mood and affect. His behavior is normal.    Ortho Exam awake alert and oriented x3.  Comfortable sitting.  Examination of the left index finger demonstrates hypertrophic changes about the DIP joint from the open fracture.  He is able to make a full fist.  Good capillary refill to the  finger.  He does have some abnormality to the ulnar aspect of the fingernail and nailbed has been injured. the nail on the ulnar half is more prominent dorsally than the other half.  Patient has been removing it and the nailbed is clean.  I just urged him to keep it clean he comes a problem he could have some surgery to eliminate the issue. Specialty Comments:  No specialty comments available.  Imaging: No results found.   PMFS History: Patient Active Problem List   Diagnosis Date Noted  . Open fracture of one or more phalanges of hand 11/21/2017  . Chronic inflammatory arthritis 08/05/2016  . High risk medication use 08/05/2016  .  Contracture of elbow, right 08/05/2016  . History of total knee replacement, bilateral 08/05/2016  . 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 History of bilateral rotator cuff tear repair 08/05/2016  . Gastroesophageal reflux  08/05/2016  . Latent tuberculosis by blood test 08/05/2016  . Ulcerative colitis 09/03/2011  . Glucose intolerance (impaired glucose tolerance) 09/03/2011  . Severe diarrhea 09/03/2011  . URI (upper respiratory infection) 07/02/2011  . Bile salt-induced diarrhea 07/02/2011  . S/P cholecystectomy 07/02/2011  . Ulcerative colitis, unspecified 04/03/2011  . Benign neoplasm of colon 04/03/2011  . Positive TB test 03/22/2011  . Insomnia due to medical condition 03/22/2011  . UC (ulcerative colitis) ([place]) 03/01/2011  . Leg cramps 03/01/2011  . Ulcerative colitis with rectal bleeding ([place]) 01/24/2011  . Nonspecific abnormal finding in stool contents 01/24/2011  . Heme positive stool 01/11/2011  . Diarrhea 01/11/2011  . Arthritis associated with inflammatory bowel disease 01/11/2011  . Intentional weight loss 01/11/2011   Past Medical History:  Diagnosis Date  . Anal fissure   . Arthritis    RA  . Blood transfusion without reported diagnosis   . Diabetes mellitus    no meds taken 05-01-16- elevated glucose was from [facility] for UC  . Diarrhea   . Fatty liver   . GERD (gastroesophageal reflux disease)   . Gout   . Hiatal hernia   . Internal hemorrhoids   . Polyarthritis   . Sessile rectal polyp 03/27/2011  . Sinusitis   . Tuberculosis    positive PPD- had an allergic reaction to the PPD, gets yearly chest xray  . Tubular adenoma of colon   . Ulcerative colitis 01/24/2011    Family History  Problem Relation Age of Onset  . Alzheimer's disease Father   . Stroke Mother   . Cancer Mother        liver & breast   . Rheum arthritis Brother   . Rheum arthritis Brother   . Rheum arthritis Sister   . Rheum arthritis Sister   . Rheum  arthritis Sister   . Colon cancer Neg Hx   . Esophageal cancer Neg Hx   . Stomach cancer Neg Hx   . Rectal cancer Neg Hx   . Colon polyps Neg Hx     Past Surgical History:  Procedure Laterality Date  . CHOLECYSTECTOMY    . COLONOSCOPY    . KNEE SURGERY     Bilateral   . POLYPECTOMY    . SHOULDER SURGERY Bilateral    Bilateral - rotator cuff repair   . TOTAL KNEE ARTHROPLASTY Bilateral    Social History   Occupational History  . Occupation: Arts administrator: [employer]  Tobacco Use  . Smoking status: Former Smoker    Packs/day: 1.00  Last attempt to quit: 07/15/1990    Years since quitting: 27.6  . Smokeless tobacco: Former Systems developer    Types: Chew  Substance and Sexual Activity  . Alcohol use: Yes    Alcohol/week: 6.0 standard drinks    Types: 6 Cans of beer per week    Comment: weekly beer  . Drug use: No  . Sexual activity: Not on file

## 2018-02-28 LAB — CBC WITH DIFFERENTIAL/PLATELET
Basophils Absolute: 20 cells/uL (ref 0–200)
Basophils Relative: 0.3 %
EOS PCT: 2.7 %
Eosinophils Absolute: 181 cells/uL (ref 15–500)
HCT: 45.1 % (ref 38.5–50.0)
Hemoglobin: 14.9 g/dL (ref 13.2–17.1)
Lymphs Abs: 2198 cells/uL (ref 850–3900)
MCH: 31.3 pg (ref 27.0–33.0)
MCHC: 33 g/dL (ref 32.0–36.0)
MCV: 94.7 fL (ref 80.0–100.0)
MONOS PCT: 11 %
MPV: 10.6 fL (ref 7.5–12.5)
NEUTROS ABS: 3564 {cells}/uL (ref 1500–7800)
Neutrophils Relative %: 53.2 %
PLATELETS: 230 10*3/uL (ref 140–400)
RBC: 4.76 10*6/uL (ref 4.20–5.80)
RDW: 14.6 % (ref 11.0–15.0)
TOTAL LYMPHOCYTE: 32.8 %
WBC mixed population: 737 cells/uL (ref 200–950)
WBC: 6.7 10*3/uL (ref 3.8–10.8)

## 2018-02-28 LAB — URIC ACID: URIC ACID, SERUM: 5.7 mg/dL (ref 4.0–8.0)

## 2018-02-28 LAB — COMPLETE METABOLIC PANEL WITH GFR
AG RATIO: 1.4 (calc) (ref 1.0–2.5)
ALBUMIN MSPROF: 4.3 g/dL (ref 3.6–5.1)
ALKALINE PHOSPHATASE (APISO): 53 U/L (ref 40–115)
ALT: 55 U/L — AB (ref 9–46)
AST: 41 U/L — ABNORMAL HIGH (ref 10–35)
BUN: 16 mg/dL (ref 7–25)
CHLORIDE: 103 mmol/L (ref 98–110)
CO2: 29 mmol/L (ref 20–32)
Calcium: 9.4 mg/dL (ref 8.6–10.3)
Creat: 0.99 mg/dL (ref 0.70–1.25)
GFR, Est African American: 90 mL/min/{1.73_m2} (ref >=60)
GFR, Est Non African American: 78 mL/min/{1.73_m2} (ref >=60)
GLUCOSE: 101 mg/dL — AB (ref 65–99)
Globulin: 3.1 g/dL (calc) (ref 1.9–3.7)
POTASSIUM: 5 mmol/L (ref 3.5–5.3)
Sodium: 140 mmol/L (ref 135–146)
Total Bilirubin: 0.8 mg/dL (ref 0.2–1.2)
Total Protein: 7.4 g/dL (ref 6.1–8.1)

## 2018-03-02 ENCOUNTER — Ambulatory Visit (HOSPITAL_COMMUNITY)
Admission: RE | Admit: 2018-03-02 | Discharge: 2018-03-02 | Disposition: A | Discharge status: Home / Self Care | Payer: Medicare Other | Source: Ambulatory Visit | Attending: Rheumatology | Admitting: Rheumatology

## 2018-03-02 DIAGNOSIS — M159 Polyosteoarthritis, unspecified: Secondary | ICD-10-CM | POA: Diagnosis not present

## 2018-03-02 DIAGNOSIS — M1990 Unspecified osteoarthritis, unspecified site: Secondary | ICD-10-CM

## 2018-03-02 MED ORDER — CERTOLIZUMAB PEGOL 2 X 200 MG ~~LOC~~ KIT
400.0000 mg | PACK | SUBCUTANEOUS | Status: AC
  Administered 2018-03-02: 400 mg via SUBCUTANEOUS
  Filled 2018-03-02 (×2): qty 400

## 2018-03-02 MED ORDER — CERTOLIZUMAB PEGOL 2 X 200 MG ~~LOC~~ KIT
400.0000 mg | PACK | SUBCUTANEOUS | Status: DC
  Filled 2018-03-02: qty 200
  Filled 2018-03-02: qty 400
  Filled 2018-03-02 (×2): qty 200

## 2018-03-02 NOTE — Progress Notes (Signed)
Uric acid is mildly elevated.  Please make sure the patient is taking allopurinol on regular basis.  LFTs are mildly elevated.  Patient had recent surgery.  We will continue to monitor.

## 2018-03-04 ENCOUNTER — Encounter: Payer: Self-pay | Admitting: Internal Medicine

## 2018-03-04 ENCOUNTER — Ambulatory Visit (AMBULATORY_SURGERY_CENTER): Payer: Medicare Other | Admitting: Internal Medicine

## 2018-03-04 VITALS — BP 114/68 | HR 68 | Temp 98.6°F | Resp 18 | Ht 71.0 in | Wt 225.0 lb

## 2018-03-04 DIAGNOSIS — E119 Type 2 diabetes mellitus without complications: Secondary | ICD-10-CM | POA: Diagnosis not present

## 2018-03-04 DIAGNOSIS — Z86010 Personal history of colonic polyps: Secondary | ICD-10-CM | POA: Diagnosis not present

## 2018-03-04 DIAGNOSIS — K621 Rectal polyp: Secondary | ICD-10-CM | POA: Diagnosis not present

## 2018-03-04 DIAGNOSIS — D128 Benign neoplasm of rectum: Secondary | ICD-10-CM

## 2018-03-04 DIAGNOSIS — D129 Benign neoplasm of anus and anal canal: Secondary | ICD-10-CM

## 2018-03-04 DIAGNOSIS — K5100 Ulcerative (chronic) pancolitis without complications: Secondary | ICD-10-CM

## 2018-03-04 DIAGNOSIS — K5150 Left sided colitis without complications: Secondary | ICD-10-CM | POA: Diagnosis not present

## 2018-03-04 DIAGNOSIS — Z8719 Personal history of other diseases of the digestive system: Secondary | ICD-10-CM

## 2018-03-04 DIAGNOSIS — K529 Noninfective gastroenteritis and colitis, unspecified: Secondary | ICD-10-CM | POA: Diagnosis not present

## 2018-03-04 DIAGNOSIS — K6289 Other specified diseases of anus and rectum: Secondary | ICD-10-CM | POA: Diagnosis not present

## 2018-03-04 DIAGNOSIS — K5190 Ulcerative colitis, unspecified, without complications: Secondary | ICD-10-CM | POA: Diagnosis not present

## 2018-03-04 DIAGNOSIS — K219 Gastro-esophageal reflux disease without esophagitis: Secondary | ICD-10-CM | POA: Diagnosis not present

## 2018-03-04 MED ORDER — SODIUM CHLORIDE 0.9 % IV SOLN: 500.0000 mL | INTRAVENOUS | Status: DC

## 2018-03-04 MED ORDER — SCOPOLAMINE 1 MG/3DAYS TD PT72: 1.0000 | MEDICATED_PATCH | TRANSDERMAL | 0 refills | Status: DC

## 2018-03-04 NOTE — Progress Notes (Signed)
To PACU, vss patent aw report to rn

## 2018-03-04 NOTE — Patient Instructions (Signed)
   Await biopsy results  Await pathology results on polyp removed  Information on polyps given to you today    YOU HAD AN ENDOSCOPIC PROCEDURE TODAY AT [facility]:   Refer to the procedure report that was given to you for any specific questions about what was found during the examination.  If the procedure report does not answer your questions, please call your gastroenterologist to clarify.  If you requested that your care partner not be given the details of your procedure findings, then the procedure report has been included in a sealed envelope for you to review at your convenience later.  YOU SHOULD EXPECT: Some feelings of bloating in the abdomen. Passage of more gas than usual.  Walking can help get rid of the air that was put into your GI tract during the procedure and reduce the bloating. If you had a lower endoscopy (such as a colonoscopy or flexible sigmoidoscopy) you may notice spotting of blood in your stool or on the toilet paper. If you underwent a bowel prep for your procedure, you may not have a normal bowel movement for a few days.  Please Note:  You might notice some irritation and congestion in your nose or some drainage.  This is from the oxygen used during your procedure.  There is no need for concern and it should clear up in a day or so.  SYMPTOMS TO REPORT IMMEDIATELY:   Following lower endoscopy (colonoscopy or flexible sigmoidoscopy):  Excessive amounts of blood in the stool  Significant tenderness or worsening of abdominal pains  Swelling of the abdomen that is new, acute  Fever of 100F or higher    For urgent or emergent issues, a gastroenterologist can be reached at any hour by calling [phone].   DIET:  We do recommend a small meal at first, but then you may proceed to your regular diet.  Drink plenty of fluids but you should avoid alcoholic beverages for 24 hours.  ACTIVITY:  You should plan to take it easy for the rest of today  and you should NOT DRIVE or use heavy machinery until tomorrow (because of the sedation medicines used during the test).    FOLLOW UP: Our staff will call the number listed on your records the next business day following your procedure to check on you and address any questions or concerns that you may have regarding the information given to you following your procedure. If we do not reach you, we will leave a message.  However, if you are feeling well and you are not experiencing any problems, there is no need to return our call.  We will assume that you have returned to your regular daily activities without incident.  If any biopsies were taken you will be contacted by phone or by letter within the next 1-3 weeks.  Please call us at [phone] if you have not heard about the biopsies in 3 weeks.    SIGNATURES/CONFIDENTIALITY: You and/or your care partner have signed paperwork which will be entered into your electronic medical record.  These signatures attest to the fact that that the information above on your After Visit Summary has been reviewed and is understood.  Full responsibility of the confidentiality of this discharge information lies with you and/or your care-partner.

## 2018-03-04 NOTE — Progress Notes (Signed)
Called to room to assist during endoscopic procedure.  Patient ID and intended procedure confirmed with present staff. Received instructions for my participation in the procedure from the performing physician.  

## 2018-03-04 NOTE — Op Note (Signed)
[facility] Patient Name: Christopher Smith Procedure Date: 03/04/2018 8:01 AM MRN: [id number] Endoscopist: Jerene Bears , MD Age: 67 Referring MD:  Date of Birth: [birth year] Gender: Male Account #: [id number] Procedure:                Colonoscopy Indications:              High risk colon cancer surveillance: Personal                            history of colonic polyps, High risk colon cancer                            surveillance: Ulcerative pancolitis of 8 (or more)                            years duration, Last colonoscopy: 2017 Medicines:                Monitored Anesthesia Care Procedure:                Pre-Anesthesia Assessment:                           - Prior to the procedure, a History and Physical                            was performed, and patient medications and                            allergies were reviewed. The patient's tolerance of                            previous anesthesia was also reviewed. The risks                            and benefits of the procedure and the sedation                            options and risks were discussed with the patient.                            All questions were answered, and informed consent                            was obtained. Prior Anticoagulants: The patient has                            taken no previous anticoagulant or antiplatelet                            agents. ASA Grade Assessment: II - A patient with                            mild systemic disease. After reviewing the risks  and benefits, the patient was deemed in                            satisfactory condition to undergo the procedure.                           After obtaining informed consent, the colonoscope                            was passed under direct vision. Throughout the                            procedure, the patient's blood pressure, pulse, and                            oxygen saturations were  monitored continuously. The                            Colonoscope was introduced through the anus and                            advanced to the terminal ileum. The colonoscopy was                            performed without difficulty. The patient tolerated                            the procedure well. The quality of the bowel                            preparation was good. The terminal ileum, ileocecal                            valve, appendiceal orifice, and rectum were                            photographed. Scope In: 8:17:16 AM Scope Out: 8:33:46 AM Scope Withdrawal Time: 0 hours 14 minutes 43 seconds  Total Procedure Duration: 0 hours 16 minutes 30 seconds  Findings:                 The digital rectal exam was normal.                           The terminal ileum appeared normal.                           Inflammation was found in a continuous and                            circumferential pattern from the rectum to the                            cecum. This is most notable in the rectum and  rectosigmoid colon (as before). This was graded as                            Mayo Score 1 (mild, with erythema, decreased                            vascular pattern, mild friability), and when                            compared to the previous examination, the findings                            are grossly unchanged. Four biopsies were taken                            every 10 cm with a cold forceps from the entire                            colon for ulcerative colitis surveillance. These                            biopsy specimens from the right colon, left colon                            and rectum were sent to Pathology.                           A 3 mm polyp was found in the distal rectum                            approaching the dentate line. The polyp was                            sessile. The polyp was removed with a cold biopsy                             forceps. Resection and retrieval were complete.                           No additional abnormalities were found on                            retroflexion. Complications:            No immediate complications. Estimated Blood Loss:     Estimated blood loss was minimal. Impression:               - The examined portion of the ileum was normal.                           - Mild (Mayo Score 1) pancolitis ulcerative                            colitis, unchanged since the last examination.  Biopsied.                           - One 3 mm polyp in the rectum, removed with a cold                            biopsy forceps. Resected and retrieved. Recommendation:           - Patient has a contact number available for                            emergencies. The signs and symptoms of potential                            delayed complications were discussed with the                            patient. Return to normal activities tomorrow.                            Written discharge instructions were provided to the                            patient.                           - Resume previous diet.                           - Continue present medications.                           - Await pathology results.                           - Repeat colonoscopy is recommended for                            surveillance. The colonoscopy date will be                            determined after pathology results from today's                            exam become available for review. Jerene Bears, MD 03/04/2018 8:42:55 AM This report has been signed electronically.

## 2018-03-04 NOTE — Progress Notes (Signed)
Pt's states no medical or surgical changes since previsit or office visit. 

## 2018-03-05 ENCOUNTER — Telehealth: Payer: Self-pay | Admitting: *Deleted

## 2018-03-05 NOTE — Telephone Encounter (Signed)
  Follow up Call-  Call back number 03/04/2018 05/01/2016 02/01/2016  Post procedure Call Back phone  # [phone]  Permission to leave phone message Yes Yes Yes  Some recent data might be hidden     Patient questions:  Do you have a fever, pain , or abdominal swelling? No. Pain Score  0 *  Have you tolerated food without any problems? Yes.    Have you been able to return to your normal activities? Yes.    Do you have any questions about your discharge instructions: Diet   No. Medications  No. Follow up visit  No.  Do you have questions or concerns about your Care? No.  Actions: * If pain score is 4 or above: No action needed, pain <4.

## 2018-03-09 ENCOUNTER — Encounter: Payer: Self-pay | Admitting: Internal Medicine

## 2018-03-12 ENCOUNTER — Other Ambulatory Visit: Payer: Self-pay | Admitting: *Deleted

## 2018-03-12 DIAGNOSIS — M1990 Unspecified osteoarthritis, unspecified site: Secondary | ICD-10-CM

## 2018-03-30 ENCOUNTER — Encounter (HOSPITAL_COMMUNITY): Payer: Medicare Other

## 2018-04-01 ENCOUNTER — Other Ambulatory Visit (HOSPITAL_COMMUNITY): Payer: Self-pay | Admitting: *Deleted

## 2018-04-02 ENCOUNTER — Ambulatory Visit (HOSPITAL_COMMUNITY)
Admission: RE | Admit: 2018-04-02 | Discharge: 2018-04-02 | Disposition: A | Discharge status: Home / Self Care | Payer: Medicare Other | Source: Ambulatory Visit | Attending: Rheumatology | Admitting: Rheumatology

## 2018-04-02 DIAGNOSIS — M1380 Other specified arthritis, unspecified site: Secondary | ICD-10-CM | POA: Diagnosis not present

## 2018-04-02 DIAGNOSIS — M1990 Unspecified osteoarthritis, unspecified site: Secondary | ICD-10-CM

## 2018-04-02 MED ORDER — CERTOLIZUMAB PEGOL 2 X 200 MG ~~LOC~~ KIT: 40.0000 mg | PACK | SUBCUTANEOUS | Status: DC

## 2018-04-02 MED ORDER — CERTOLIZUMAB PEGOL 2 X 200 MG/ML ~~LOC~~ KIT
400.0000 mg | PACK | SUBCUTANEOUS | Status: DC
  Administered 2018-04-02: 400 mg via SUBCUTANEOUS
  Filled 2018-04-02: qty 1

## 2018-04-07 NOTE — Progress Notes (Signed)
Office Visit Note  Patient: Christopher Smith             Date of Birth: 1950/10/26           MRN: [id number]             PCP: Thressa Sheller, MD Referring: Thressa Sheller, MD Visit Date: 04/21/2018 Occupation: @GUAROCC @  Subjective:  Pain in  hands.   History of Present Illness: Christopher Smith is a 67 y.o. male with history of ulcerative colitis, inflammatory arthritis and gouty arthropathy.  He is denies having gout flares since the last visit.  He continues to have some discomfort in his hands.  He states with the weather change he experiences increased pain.  He currently have upper respiratory tract infection and bronchitis for which he is taking antibiotics.  Activities of Daily Living:  Patient reports morning stiffness for 0 minute.   Patient Denies nocturnal pain.  Difficulty dressing/grooming: Denies Difficulty climbing stairs: Denies Difficulty getting out of chair: Denies Difficulty using hands for taps, buttons, cutlery, and/or writing: Denies  Review of Systems  Constitutional: Negative for fatigue and night sweats.  HENT: Negative for mouth sores, mouth dryness and nose dryness.   Eyes: Negative for redness and dryness.  Respiratory: Negative for shortness of breath and difficulty breathing.   Cardiovascular: Positive for chest pain. Negative for palpitations, hypertension, irregular heartbeat and swelling in legs/feet.  Gastrointestinal: Positive for diarrhea. Negative for constipation.  Endocrine: Negative for increased urination.  Musculoskeletal: Positive for arthralgias, joint pain and morning stiffness. Negative for joint swelling, myalgias, muscle weakness, muscle tenderness and myalgias.  Skin: Negative for color change, rash, hair loss, nodules/bumps, skin tightness, ulcers and sensitivity to sunlight.  Allergic/Immunologic: Negative for susceptible to infections.  Neurological: Negative for dizziness, fainting, memory loss, night sweats and weakness (  ).  Hematological: Negative for swollen glands.  Psychiatric/Behavioral: Negative for depressed mood and sleep disturbance. The patient is not nervous/anxious.     PMFS History:  Patient Active Problem List   Diagnosis Date Noted  . Open fracture of one or more phalanges of hand 11/21/2017  . Chronic inflammatory arthritis 08/05/2016  . High risk medication use 08/05/2016  . Contracture of elbow, right 08/05/2016  . History of total knee replacement, bilateral 08/05/2016  . 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 History of bilateral rotator cuff tear repair 08/05/2016  . Gastroesophageal reflux  08/05/2016  . Latent tuberculosis by blood test 08/05/2016  . Ulcerative colitis 09/03/2011  . Glucose intolerance (impaired glucose tolerance) 09/03/2011  . Severe diarrhea 09/03/2011  . URI (upper respiratory infection) 07/02/2011  . Bile salt-induced diarrhea 07/02/2011  . S/P cholecystectomy 07/02/2011  . Ulcerative colitis, unspecified 04/03/2011  . Benign neoplasm of colon 04/03/2011  . Positive TB test 03/22/2011  . Insomnia due to medical condition 03/22/2011  . UC (ulcerative colitis) ([facility]) 03/01/2011  . Leg cramps 03/01/2011  . Ulcerative colitis with rectal bleeding ([facility]) 01/24/2011  . Nonspecific abnormal finding in stool contents 01/24/2011  . Heme positive stool 01/11/2011  . Diarrhea 01/11/2011  . Arthritis associated with inflammatory bowel disease 01/11/2011  . Intentional weight loss 01/11/2011    Past Medical History:  Diagnosis Date  . Anal fissure   . Arthritis    RA  . Blood transfusion without reported diagnosis   . Diabetes mellitus    no meds taken 05-01-16- elevated glucose was from [facility] for UC  . Diarrhea   . Fatty liver   .  GERD (gastroesophageal reflux disease)   . Gout   . Hiatal hernia   . Internal hemorrhoids   . Polyarthritis   . Sessile rectal polyp 03/27/2011  . Sinusitis   . Tuberculosis    positive PPD- had an  allergic reaction to the PPD, gets yearly chest xray  . Tubular adenoma of colon   . Ulcerative colitis 01/24/2011    Family History  Problem Relation Age of Onset  . Alzheimer's disease Father   . Stroke Mother   . Cancer Mother        liver & breast   . Rheum arthritis Brother   . Rheum arthritis Brother   . Rheum arthritis Sister   . Rheum arthritis Sister   . Rheum arthritis Sister   . Colon cancer Neg Hx   . Esophageal cancer Neg Hx   . Stomach cancer Neg Hx   . Rectal cancer Neg Hx   . Colon polyps Neg Hx    Past Surgical History:  Procedure Laterality Date  . CHOLECYSTECTOMY    . COLONOSCOPY    . KNEE SURGERY     Bilateral   . POLYPECTOMY    . SHOULDER SURGERY Bilateral    Bilateral - rotator cuff repair   . TOTAL KNEE ARTHROPLASTY Bilateral    Social History   Social History Narrative   1 caffeine drink daily     Objective: Vital Signs: BP 139/78 (BP Location: Left Arm, Patient Position: Sitting, Cuff Size: Normal)   Pulse 69   Resp 15   Ht 5' 11"  (1.803 m)   Wt 223 lb 12.8 oz (101.5 kg)   BMI 31.21 kg/m    Physical Exam  Constitutional: He is oriented to person, place, and time. He appears well-developed and well-nourished.  HENT:  Head: Normocephalic and atraumatic.  Eyes: Pupils are equal, round, and reactive to light. Conjunctivae and EOM are normal.  Neck: Normal range of motion. Neck supple.  Cardiovascular: Normal rate, regular rhythm and normal heart sounds.  Pulmonary/Chest: Effort normal and breath sounds normal.  Abdominal: Soft. Bowel sounds are normal.  Neurological: He is alert and oriented to person, place, and time.  Skin: Skin is warm and dry. Capillary refill takes less than 2 seconds.  Psychiatric: He has a normal mood and affect. His behavior is normal.  Nursing note and vitals reviewed.    Musculoskeletal Exam: C-spine thoracic lumbar spine good range of motion.  Shoulder joints were in good range of motion.  He has right elbow  joint contracture.  He has DIP and PIP thickening in his bilateral hands consistent with osteoarthritis.  Hip joints knee joints were in good range of motion.  There was no evidence of plantar fasciitis or Achilles tendinitis.  He had some SI joint tenderness.  No active synovitis was noted in any of his joints.  CDAI Exam: CDAI Score: 0.6  Patient Global Assessment: 4 (mm); Provider Global Assessment: 2 (mm) Swollen: 0 ; Tender: 0  Joint Exam   Not documented   There is currently no information documented on the homunculus. Go to the Rheumatology activity and complete the homunculus joint exam.  Investigation: No additional findings.  Imaging: No results found.  Recent Labs: Lab Results  Component Value Date   WBC 6.7 02/27/2018   HGB 14.9 02/27/2018   PLT 230 02/27/2018   NA 140 02/27/2018   K 5.0 02/27/2018   CL 103 02/27/2018   CO2 29 02/27/2018   GLUCOSE 101 (H) 02/27/2018  BUN 16 02/27/2018   CREATININE 0.99 02/27/2018   BILITOT 0.8 02/27/2018   ALKPHOS 65 11/19/2016   AST 41 (H) 02/27/2018   ALT 55 (H) 02/27/2018   PROT 7.4 02/27/2018   ALBUMIN 4.4 11/19/2016   CALCIUM 9.4 02/27/2018   GFRAA 90 02/27/2018  March 2019 chest x-ray normal  Speciality Comments: No specialty comments available.  Procedures:  No procedures performed Allergies: Patient has no known allergies.   Assessment / Plan:     Visit Diagnoses: Seronegative arthritis - inflammatory, erosive.  Patient has no active synovitis.  He appears to be doing well on current combination.  High risk medication use - Cimzia subcu monthly, MTX 0.6 mL subcu weekly, folic acid 1 mg p.o. daily ( patient has failed him Humira and Remicade in the past. He states that Humira controlled his arthritis but not ulcerative colitis)  Bronchitis-patient states that currently he is on antibiotics for bronchitis.  I have advised him to hold methotrexate and delayed Cimzia until his infection completely clears  up.  Idiopathic chronic gout of multiple sites without tophus - allopurinol, mitigare.Uric acid: 02/27/2018 5.7.  He denies any flares of gout.  Osteoarthritis bilateral hands-he has chronic pain and discomfort in his hands.  He uses diclofenac gel which is useful.  Prescription for diclofenac was given today.  Side effects were reviewed.  Other ulcerative colitis with other complication (HCC)-he denies having flares of ulcerative colitis although he has chronic diarrhea.  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  History of bilateral rotator cuff tear repair-he has chronic pain in his bilateral shoulders.  History of total bilateral knee replacement - Dr. Ronnie Derby.  Doing well.  Positive TB test - 2011.  His last chest x-ray was September 15, 2017.  History of cholecystectomy  History of gastroesophageal reflux (GERD)   Orders: No orders of the defined types were placed in this encounter.  Meds ordered this encounter  Medications  . diclofenac sodium (VOLTAREN) 1 % GEL    Sig: Apply 3 grams to 3 large joints, up to 3 times daily as needed.    Dispense:  3 Tube    Refill:  3    Face-to-face time spent with patient was 30 minutes. Greater than 50% of time was spent in counseling and coordination of care.  Follow-Up Instructions: Return in about 5 months (around 09/20/2018) for UC, inflammatory arthritis,, Gout.   Bo Merino, MD  Note - This record has been created using Editor, commissioning.  Chart creation errors have been sought, but may not always  have been located. Such creation errors do not reflect on  the standard of medical care.

## 2018-04-14 ENCOUNTER — Other Ambulatory Visit: Payer: Self-pay | Admitting: Rheumatology

## 2018-04-14 NOTE — Telephone Encounter (Signed)
Last visit: 11/10/2017 Next visit: 04/21/2018 Labs: 02/27/18 Uric acid is mildly elevated. LFTs are mildly elevated. We will continue to monitor.  Okay to refill per Dr. Estanislado Pandy

## 2018-04-16 DIAGNOSIS — J019 Acute sinusitis, unspecified: Secondary | ICD-10-CM | POA: Diagnosis not present

## 2018-04-16 DIAGNOSIS — R05 Cough: Secondary | ICD-10-CM | POA: Diagnosis not present

## 2018-04-21 ENCOUNTER — Encounter: Payer: Self-pay | Admitting: Rheumatology

## 2018-04-21 ENCOUNTER — Ambulatory Visit (INDEPENDENT_AMBULATORY_CARE_PROVIDER_SITE_OTHER): Payer: Medicare Other | Admitting: Rheumatology

## 2018-04-21 VITALS — BP 139/78 | HR 69 | Resp 15 | Ht 71.0 in | Wt 223.8 lb

## 2018-04-21 DIAGNOSIS — M1380 Other specified arthritis, unspecified site: Secondary | ICD-10-CM

## 2018-04-21 DIAGNOSIS — M19041 Primary osteoarthritis, right hand: Secondary | ICD-10-CM

## 2018-04-21 DIAGNOSIS — K51818 Other ulcerative colitis with other complication: Secondary | ICD-10-CM

## 2018-04-21 DIAGNOSIS — M1A09X0 Idiopathic chronic gout, multiple sites, without tophus (tophi): Secondary | ICD-10-CM | POA: Diagnosis not present

## 2018-04-21 DIAGNOSIS — Z79899 Other long term (current) drug therapy: Secondary | ICD-10-CM

## 2018-04-21 DIAGNOSIS — M24521 Contracture, right elbow: Secondary | ICD-10-CM

## 2018-04-21 DIAGNOSIS — Z9049 Acquired absence of other specified parts of digestive tract: Secondary | ICD-10-CM

## 2018-04-21 DIAGNOSIS — R7611 Nonspecific reaction to tuberculin skin test without active tuberculosis: Secondary | ICD-10-CM

## 2018-04-21 DIAGNOSIS — M19042 Primary osteoarthritis, left hand: Secondary | ICD-10-CM | POA: Diagnosis not present

## 2018-04-21 DIAGNOSIS — Z96653 Presence of artificial knee joint, bilateral: Secondary | ICD-10-CM

## 2018-04-21 DIAGNOSIS — M12819 Other specific arthropathies, not elsewhere classified, unspecified shoulder: Secondary | ICD-10-CM

## 2018-04-21 DIAGNOSIS — Z8719 Personal history of other diseases of the digestive system: Secondary | ICD-10-CM | POA: Diagnosis not present

## 2018-04-21 MED ORDER — DICLOFENAC SODIUM 1 % TD GEL: TRANSDERMAL | 3 refills | Status: DC

## 2018-04-21 NOTE — Patient Instructions (Signed)
Standing Labs We placed an order today for your standing lab work.    Please come back and get your standing labs in November and every 3 months  We have open lab Monday through Friday from 8:30-11:30 AM and 1:30-4:00 PM  at the office of Dr. Bo Merino.   You may experience shorter wait times on Monday and Friday afternoons. The office is located at [address], [place], [place], [place] [address] No appointment is necessary.   Labs are drawn by [facility].  You may receive a bill from [facility] for your lab work. If you have any questions regarding directions or hours of operation,  please call [phone].   Just as a reminder please drink plenty of water prior to coming for your lab work. Thanks!

## 2018-04-22 ENCOUNTER — Other Ambulatory Visit: Payer: Self-pay | Admitting: *Deleted

## 2018-04-22 NOTE — Progress Notes (Signed)
Infusion orders are current for patient.

## 2018-04-30 ENCOUNTER — Ambulatory Visit (HOSPITAL_COMMUNITY)
Admission: RE | Admit: 2018-04-30 | Discharge: 2018-04-30 | Disposition: A | Discharge status: Home / Self Care | Payer: Medicare Other | Source: Ambulatory Visit | Attending: Rheumatology | Admitting: Rheumatology

## 2018-04-30 DIAGNOSIS — M1990 Unspecified osteoarthritis, unspecified site: Secondary | ICD-10-CM | POA: Insufficient documentation

## 2018-04-30 MED ORDER — CERTOLIZUMAB PEGOL 2 X 200 MG/ML ~~LOC~~ KIT
400.0000 mg | PACK | SUBCUTANEOUS | Status: DC
  Administered 2018-04-30: 400 mg via SUBCUTANEOUS
  Filled 2018-04-30 (×3): qty 400

## 2018-05-28 ENCOUNTER — Ambulatory Visit (HOSPITAL_COMMUNITY)
Admission: RE | Admit: 2018-05-28 | Discharge: 2018-05-28 | Disposition: A | Discharge status: Home / Self Care | Payer: Medicare Other | Source: Ambulatory Visit | Attending: Rheumatology | Admitting: Rheumatology

## 2018-05-28 ENCOUNTER — Telehealth: Payer: Self-pay | Admitting: Rheumatology

## 2018-05-28 DIAGNOSIS — M1380 Other specified arthritis, unspecified site: Secondary | ICD-10-CM | POA: Diagnosis not present

## 2018-05-28 MED ORDER — CERTOLIZUMAB PEGOL 2 X 200 MG/ML ~~LOC~~ KIT
400.0000 mg | PACK | SUBCUTANEOUS | Status: DC
  Administered 2018-05-28: 400 mg via SUBCUTANEOUS
  Filled 2018-05-28 (×2): qty 1

## 2018-05-28 NOTE — Telephone Encounter (Signed)
Patient is currently getting Cimzia at the hospital. Would you like me to see if we can do buy and bill in the office for the Cimzia?

## 2018-05-28 NOTE — Telephone Encounter (Signed)
Patient had last scheduled infusion today. Per patient he was told to contact our office to get approval for next infusion due at the end of December. Please call patient to advise.

## 2018-05-28 NOTE — Telephone Encounter (Signed)
ok 

## 2018-05-29 NOTE — Telephone Encounter (Signed)
Submitted for Cimzia buy and bill in the office  On 05/29/18

## 2018-06-01 ENCOUNTER — Telehealth: Payer: Self-pay | Admitting: Rheumatology

## 2018-06-01 NOTE — Telephone Encounter (Signed)
Patient left a voicemail requesting a return call to let him know if his insurance approved his medication for infusion.  Patient requested a return call.

## 2018-06-01 NOTE — Telephone Encounter (Signed)
Spoke with patient's wife and advised that we have done reverification of benefits. Patient's wife advised that we will be able to have patient come to the office once monthly for buy and bill Cimzia. She will have patient call the office.   patient contacted the office and advised we will be bringing him to the office for Cimzia but and bill. Patient has been scheduled for 06/25/18 at 9 am.

## 2018-06-25 ENCOUNTER — Ambulatory Visit (INDEPENDENT_AMBULATORY_CARE_PROVIDER_SITE_OTHER): Payer: Medicare Other | Admitting: *Deleted

## 2018-06-25 ENCOUNTER — Other Ambulatory Visit: Payer: Self-pay | Admitting: Rheumatology

## 2018-06-25 ENCOUNTER — Other Ambulatory Visit: Payer: Self-pay | Admitting: *Deleted

## 2018-06-25 VITALS — BP 160/69 | HR 54

## 2018-06-25 DIAGNOSIS — Z79899 Other long term (current) drug therapy: Secondary | ICD-10-CM

## 2018-06-25 DIAGNOSIS — M1380 Other specified arthritis, unspecified site: Secondary | ICD-10-CM | POA: Diagnosis not present

## 2018-06-25 MED ORDER — CERTOLIZUMAB PEGOL 2 X 200 MG ~~LOC~~ KIT
400.0000 mg | PACK | Freq: Once | SUBCUTANEOUS | Status: AC
  Administered 2018-06-25: 400 mg via SUBCUTANEOUS

## 2018-06-25 NOTE — Progress Notes (Signed)
Patient in office for Cimzia injection. Patient was getting his Cimzia injections at the hospital and is now getting his Cimzia injections in office. Patient given injections in right and left lower abdomen. Patient tolerated injections well.  Patient had labs at this appointment. Patient will return 07/23/18 for next Cimzia injection.   Administrations This Visit    Certolizumab Pegol KIT 400 mg    Admin Date 06/25/2018 Action Given Dose 400 mg Route Subcutaneous Administered By Carole Binning, LPN

## 2018-06-26 LAB — COMPLETE METABOLIC PANEL WITH GFR
AG Ratio: 1.3 (calc) (ref 1.0–2.5)
ALBUMIN MSPROF: 4.3 g/dL (ref 3.6–5.1)
ALKALINE PHOSPHATASE (APISO): 48 U/L (ref 40–115)
ALT: 36 U/L (ref 9–46)
AST: 29 U/L (ref 10–35)
BILIRUBIN TOTAL: 0.8 mg/dL (ref 0.2–1.2)
BUN: 15 mg/dL (ref 7–25)
CO2: 29 mmol/L (ref 20–32)
CREATININE: 0.85 mg/dL (ref 0.70–1.25)
Calcium: 9.3 mg/dL (ref 8.6–10.3)
Chloride: 102 mmol/L (ref 98–110)
GFR, EST AFRICAN AMERICAN: 104 mL/min/{1.73_m2} (ref >=60)
GFR, Est Non African American: 90 mL/min/{1.73_m2} (ref >=60)
GLOBULIN: 3.3 g/dL (ref 1.9–3.7)
GLUCOSE: 100 mg/dL — AB (ref 65–99)
Potassium: 4.4 mmol/L (ref 3.5–5.3)
Sodium: 139 mmol/L (ref 135–146)
Total Protein: 7.6 g/dL (ref 6.1–8.1)

## 2018-06-26 LAB — CBC WITH DIFFERENTIAL/PLATELET
Basophils Absolute: 27 cells/uL (ref 0–200)
Basophils Relative: 0.4 %
EOS ABS: 190 {cells}/uL (ref 15–500)
Eosinophils Relative: 2.8 %
HEMATOCRIT: 43.4 % (ref 38.5–50.0)
Hemoglobin: 14.7 g/dL (ref 13.2–17.1)
LYMPHS ABS: 2441 {cells}/uL (ref 850–3900)
MCH: 31.8 pg (ref 27.0–33.0)
MCHC: 33.9 g/dL (ref 32.0–36.0)
MCV: 93.9 fL (ref 80.0–100.0)
MPV: 11.4 fL (ref 7.5–12.5)
Monocytes Relative: 10.5 %
NEUTROS PCT: 50.4 %
Neutro Abs: 3427 cells/uL (ref 1500–7800)
PLATELETS: 273 10*3/uL (ref 140–400)
RBC: 4.62 10*6/uL (ref 4.20–5.80)
RDW: 14.2 % (ref 11.0–15.0)
Total Lymphocyte: 35.9 %
WBC mixed population: 714 cells/uL (ref 200–950)
WBC: 6.8 10*3/uL (ref 3.8–10.8)

## 2018-06-26 NOTE — Progress Notes (Signed)
WNLs

## 2018-07-09 ENCOUNTER — Other Ambulatory Visit: Payer: Self-pay | Admitting: Rheumatology

## 2018-07-09 NOTE — Telephone Encounter (Signed)
Last visit: 04/21/18 Next visit: 09/21/18 Labs: 06/25/18 WNL  Okay to refill per Dr. Estanislado Pandy

## 2018-07-20 DIAGNOSIS — E1122 Type 2 diabetes mellitus with diabetic chronic kidney disease: Secondary | ICD-10-CM | POA: Diagnosis not present

## 2018-07-20 DIAGNOSIS — E785 Hyperlipidemia, unspecified: Secondary | ICD-10-CM | POA: Diagnosis not present

## 2018-07-23 ENCOUNTER — Ambulatory Visit (INDEPENDENT_AMBULATORY_CARE_PROVIDER_SITE_OTHER): Payer: Medicare Other | Admitting: *Deleted

## 2018-07-23 VITALS — BP 150/87 | HR 64

## 2018-07-23 DIAGNOSIS — M1380 Other specified arthritis, unspecified site: Secondary | ICD-10-CM | POA: Diagnosis not present

## 2018-07-23 MED ORDER — CERTOLIZUMAB PEGOL 2 X 200 MG ~~LOC~~ KIT
400.0000 mg | PACK | Freq: Once | SUBCUTANEOUS | Status: AC
  Administered 2018-07-23: 400 mg via SUBCUTANEOUS

## 2018-07-23 NOTE — Progress Notes (Signed)
Patient in office for a Cimzia Injection. Patient denies any infection, fever or use of antibiotics. Patient was given Cimzia in right and left lower abdomen. Patient tolerated injections well and will return in 1 month for next injection.   Administrations This Visit    Certolizumab Pegol KIT 400 mg    Admin Date 07/23/2018 Action Given Dose 400 mg Route Subcutaneous Administered By Carole Binning, LPN

## 2018-07-25 ENCOUNTER — Other Ambulatory Visit: Payer: Self-pay | Admitting: Rheumatology

## 2018-07-27 DIAGNOSIS — E1122 Type 2 diabetes mellitus with diabetic chronic kidney disease: Secondary | ICD-10-CM | POA: Diagnosis not present

## 2018-07-27 DIAGNOSIS — E785 Hyperlipidemia, unspecified: Secondary | ICD-10-CM | POA: Diagnosis not present

## 2018-07-27 DIAGNOSIS — Z8739 Personal history of other diseases of the musculoskeletal system and connective tissue: Secondary | ICD-10-CM | POA: Diagnosis not present

## 2018-07-27 DIAGNOSIS — Z23 Encounter for immunization: Secondary | ICD-10-CM | POA: Diagnosis not present

## 2018-07-27 NOTE — Telephone Encounter (Signed)
Last visit: 04/21/18 Next visit: 09/21/18 Labs: 06/25/18 WNL  Okay to refill per Dr. Estanislado Pandy

## 2018-08-17 ENCOUNTER — Other Ambulatory Visit: Payer: Self-pay | Admitting: Rheumatology

## 2018-08-18 NOTE — Telephone Encounter (Signed)
Last visit: 04/21/18 Next visit: 09/21/18 Labs: 06/25/18 WNL  Okay to refill per Dr. Estanislado Pandy

## 2018-08-20 ENCOUNTER — Ambulatory Visit (INDEPENDENT_AMBULATORY_CARE_PROVIDER_SITE_OTHER): Payer: Medicare Other | Admitting: *Deleted

## 2018-08-20 VITALS — BP 135/80 | HR 67

## 2018-08-20 DIAGNOSIS — M1380 Other specified arthritis, unspecified site: Secondary | ICD-10-CM

## 2018-08-20 MED ORDER — CERTOLIZUMAB PEGOL 2 X 200 MG ~~LOC~~ KIT
400.0000 mg | PACK | Freq: Once | SUBCUTANEOUS | Status: AC
  Administered 2018-08-20: 400 mg via SUBCUTANEOUS

## 2018-08-20 NOTE — Progress Notes (Signed)
Patient in office for Cimzia injection. Patient denies, infection, fever or use of antibiotics. Patient was given injection in right and left lower abdomen . Patient tolerated injection well. Patient will return in 1 month for the next injection and will update labs at that time. Patient will also be due to update yearly chest x-ray.   Administrations This Visit    Certolizumab Pegol KIT 400 mg    Admin Date 08/20/2018 Action Given Dose 400 mg Route Subcutaneous Administered By Carole Binning, LPN

## 2018-09-02 IMAGING — DX DG CHEST 2V
2 series · 2 of 2 positions shown · non-contrast
Comparison: Chest x-ray dated August 22, 2016.

CLINICAL DATA: Tuberculosis screening. History of positive TB test.
On long-term immunosuppressive therapy.

EXAM:
CHEST  2 VIEW

[chest pa]
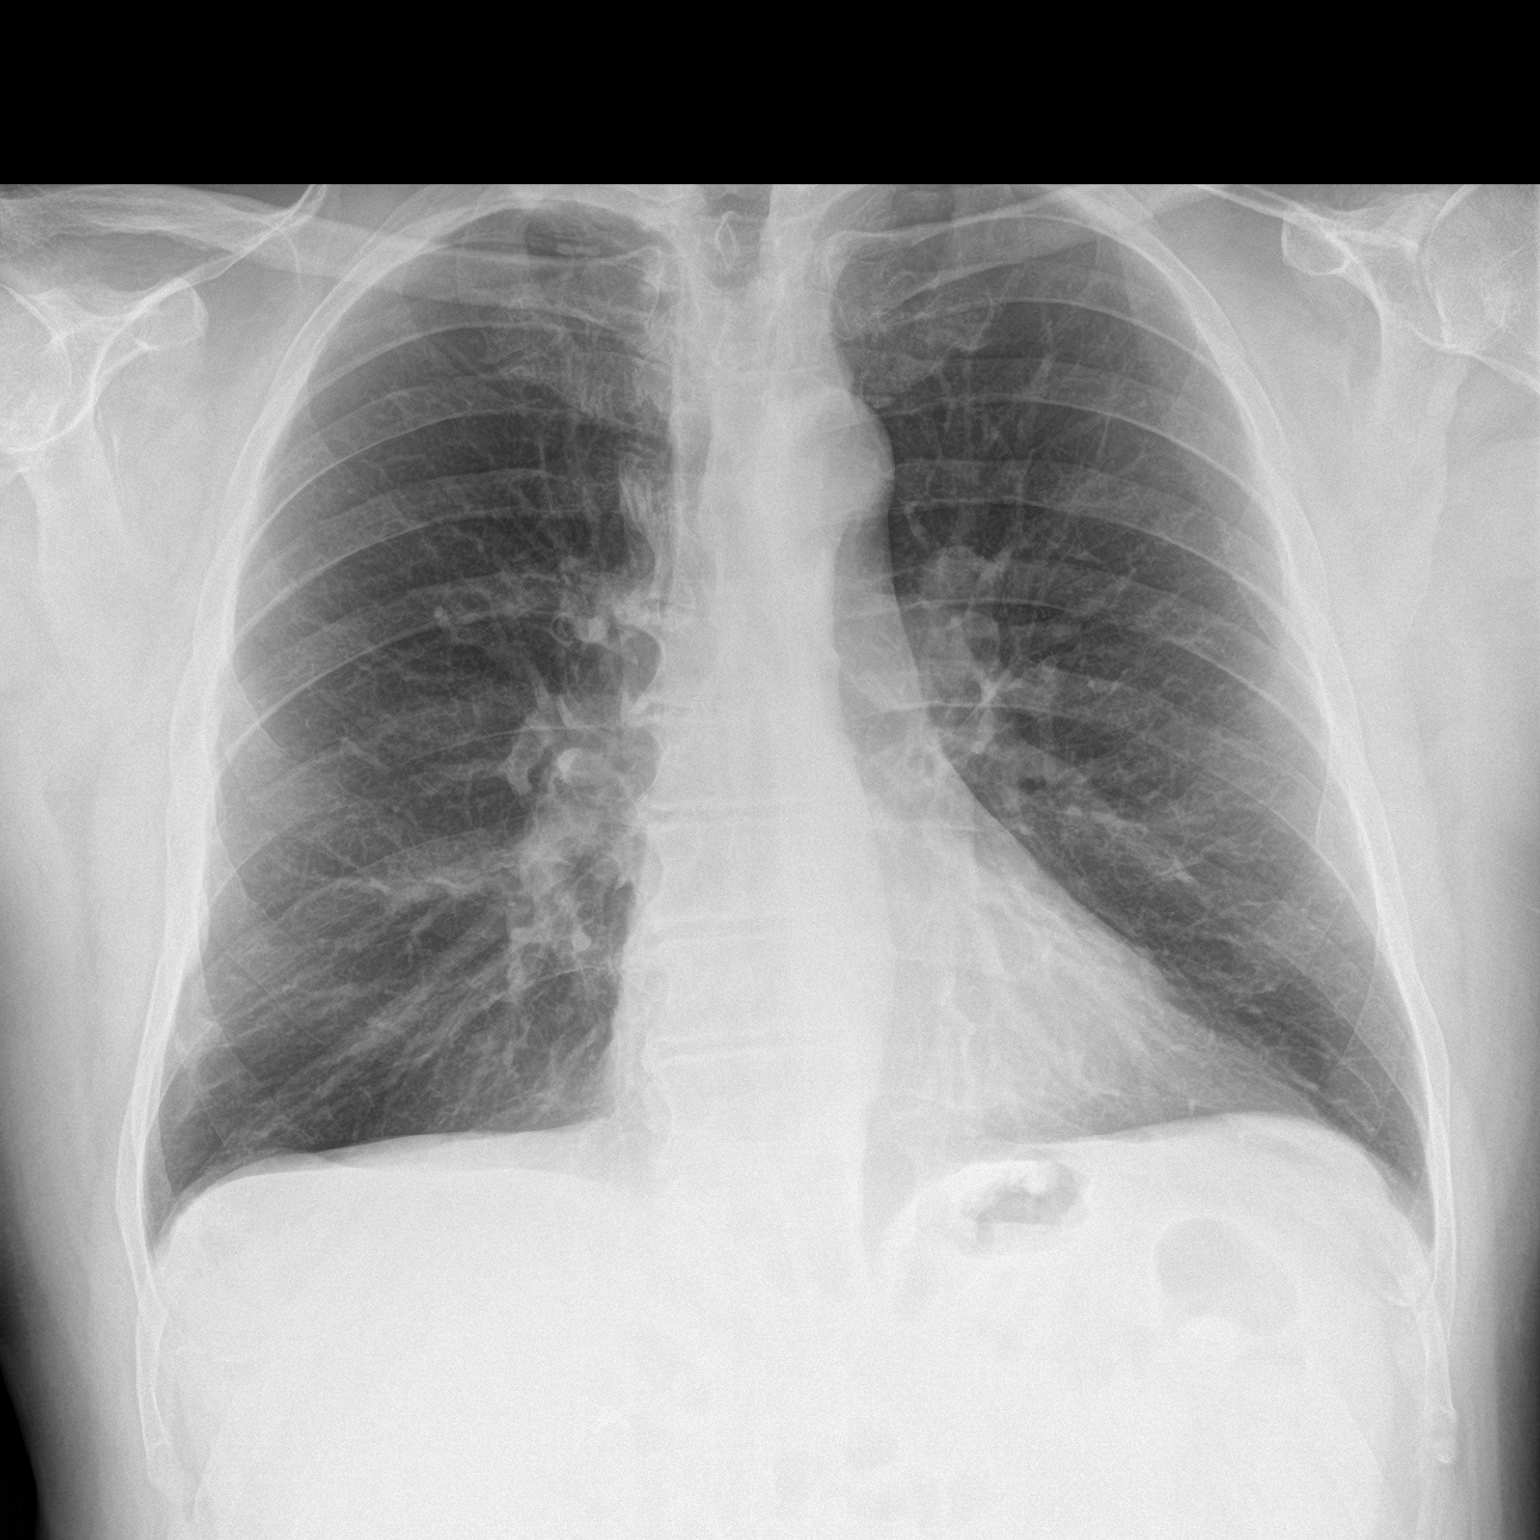

[chest lat]
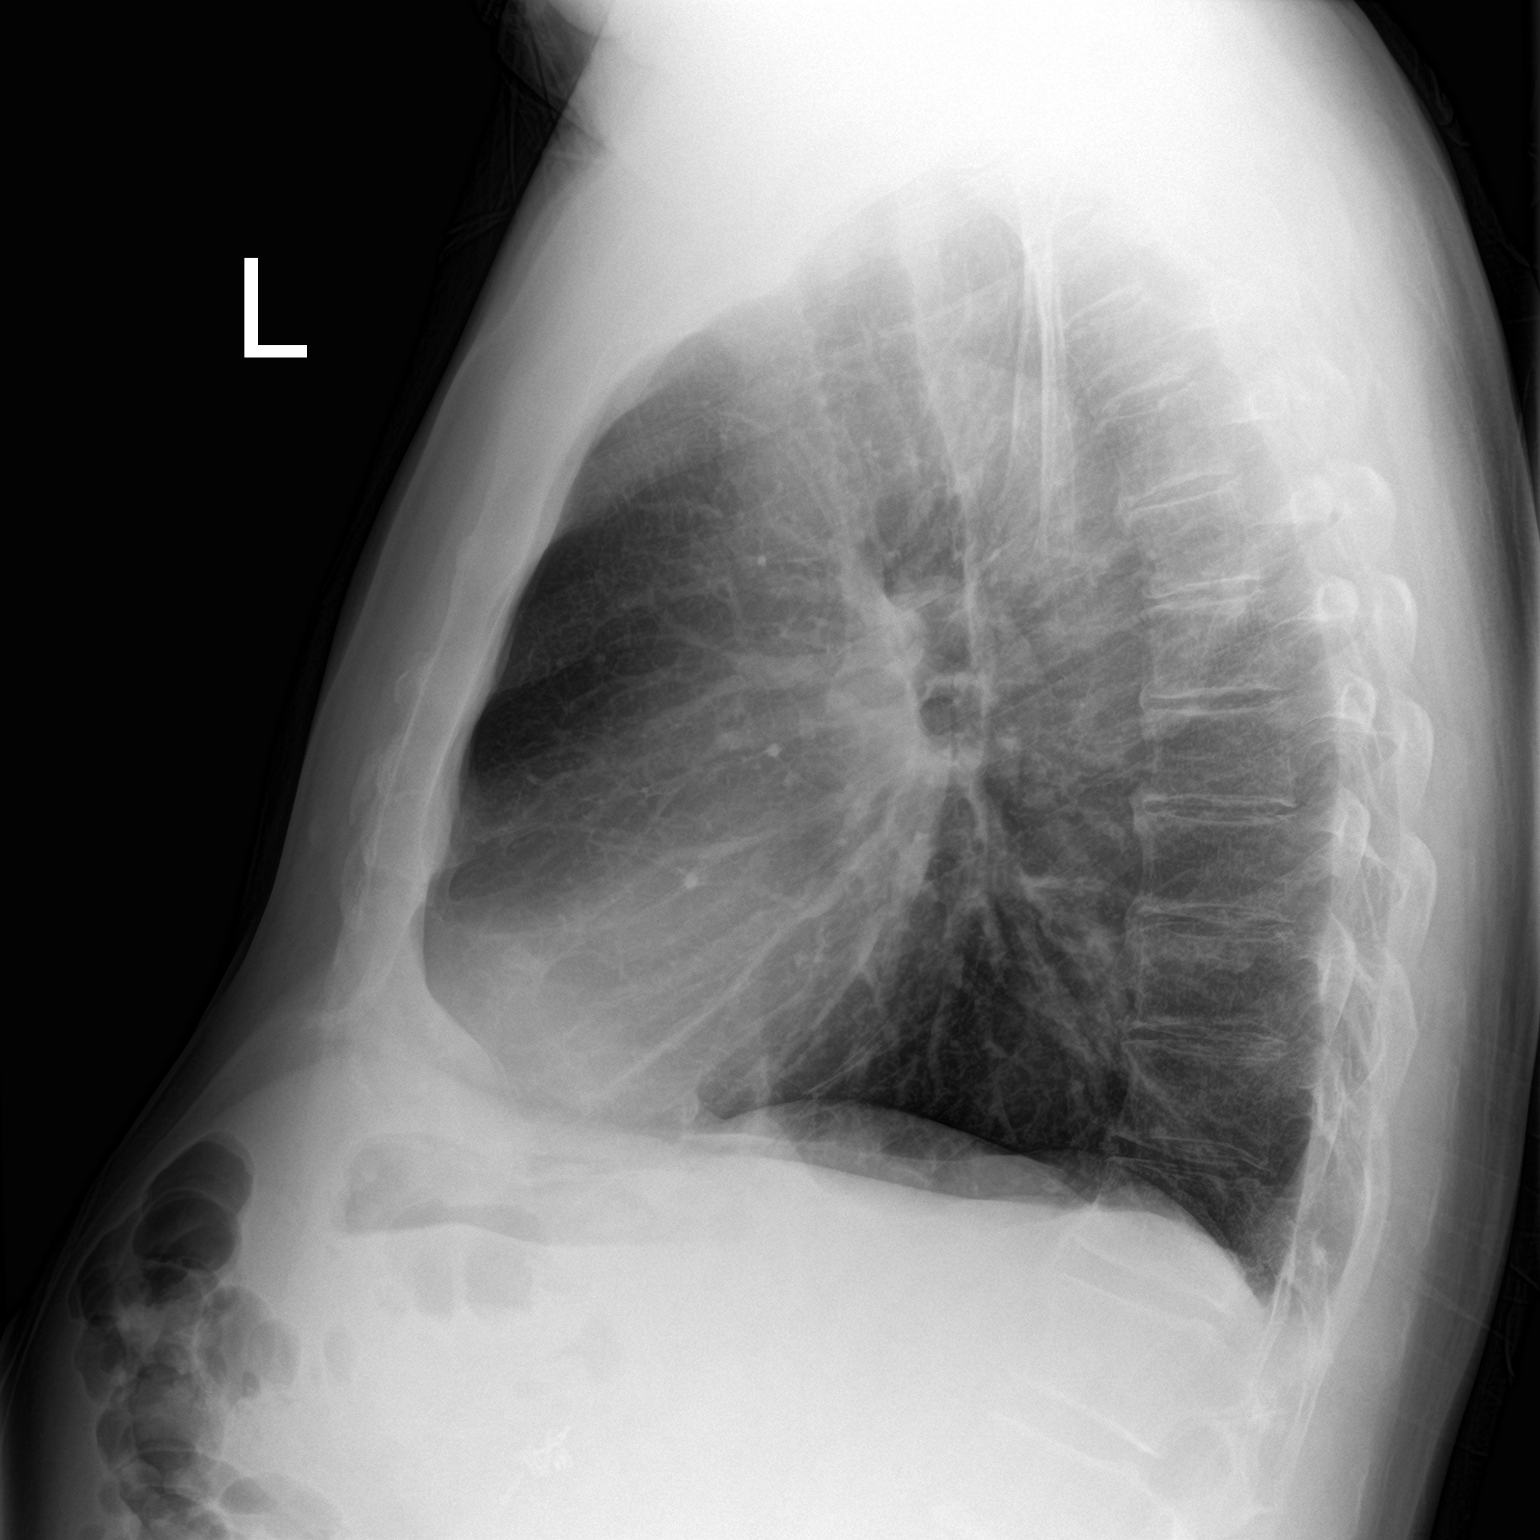

[2 of 2 positions shown; findings below may reference images not displayed]

FINDINGS: The heart size and mediastinal contours are within normal limits.
Normal pulmonary vascularity. No focal consolidation, pleural
effusion, or pneumothorax. No acute osseous abnormality.
IMPRESSION: No active cardiopulmonary disease.

## 2018-09-07 NOTE — Progress Notes (Signed)
Office Visit Note  Patient: Christopher Smith             Date of Birth: 1951/05/15           MRN: [id number]             PCP: Thressa Sheller, MD (Inactive) Referring: Thressa Sheller, MD Visit Date: 09/21/2018 Occupation: @GUAROCC @  Subjective:  Pain in multiple joints    History of Present Illness: Christopher Smith is a 68 y.o. male with history of seronegative rheumatoid arthritis, UC, and osteoarthritis.  He is on Cimzia 400 mg sq monthly injections, MTX 0.6 ml sq once weekly and folic acid 1 mg po daily.  He denies any recent RA flares.  He states he has increased stiffness and arthralgias in the wintertime.  He states he has chronic pain in both hands, worse in the right hand.  He reports decreased grip strength and discomfort when making a fist.  He has been unable to wear his rings.  He states he has pain in both ankle joints but denies any joint swelling.  He has left elbow joint pain.   He continues to have chronic neck and lower back pain.  He states bilateral knee replacements are doing well.  He denies any recent ulcerative colitis flares.  He denies any blood in his stool.  He is following up with GI next week.   He denies any recent gout flares.  He takes allopurinol 300 mg po daily and colchicine 0.6 mg 1 tablet po daily.  He had a CXR on 09/18/18 that did not reveal any new changes when compared to previous images.   Activities of Daily Living:  Patient reports morning stiffness for 10-15  minutes.   Patient Reports nocturnal pain.  Difficulty dressing/grooming: Denies Difficulty climbing stairs: Reports Difficulty getting out of chair: Reports Difficulty using hands for taps, buttons, cutlery, and/or writing: Reports  Review of Systems  Constitutional: Negative for fatigue and night sweats.  HENT: Negative for mouth sores, mouth dryness and nose dryness.   Eyes: Negative for redness, visual disturbance and dryness.  Respiratory: Negative for cough, hemoptysis,  shortness of breath and difficulty breathing.   Cardiovascular: Negative for chest pain, palpitations, hypertension, irregular heartbeat and swelling in legs/feet.  Gastrointestinal: Positive for constipation (hx of UC) and diarrhea. Negative for blood in stool.  Endocrine: Negative for increased urination.  Genitourinary: Negative for painful urination.  Musculoskeletal: Positive for arthralgias, joint pain, joint swelling and morning stiffness. Negative for myalgias, muscle weakness, muscle tenderness and myalgias.  Skin: Negative for color change, rash, hair loss, nodules/bumps, skin tightness, ulcers and sensitivity to sunlight.  Allergic/Immunologic: Negative for susceptible to infections.  Neurological: Negative for dizziness, fainting, memory loss, night sweats and weakness.  Hematological: Negative for swollen glands.  Psychiatric/Behavioral: Negative for depressed mood and sleep disturbance. The patient is not nervous/anxious.     PMFS History:  Patient Active Problem List   Diagnosis Date Noted  . Open fracture of one or more phalanges of hand 11/21/2017  . Chronic inflammatory arthritis 08/05/2016  . High risk medication use 08/05/2016  . Contracture of elbow, right 08/05/2016  . History of total knee replacement, bilateral 08/05/2016  . 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 History of bilateral rotator cuff tear repair 08/05/2016  . Gastroesophageal reflux  08/05/2016  . Latent tuberculosis by blood test 08/05/2016  . Ulcerative colitis 09/03/2011  . Glucose intolerance (impaired glucose tolerance) 09/03/2011  . Severe  diarrhea 09/03/2011  . URI (upper respiratory infection) 07/02/2011  . Bile salt-induced diarrhea 07/02/2011  . S/P cholecystectomy 07/02/2011  . Ulcerative colitis, unspecified 04/03/2011  . Benign neoplasm of colon 04/03/2011  . Positive TB test 03/22/2011  . Insomnia due to medical condition 03/22/2011  . UC (ulcerative colitis)  ([place]) 03/01/2011  . Leg cramps 03/01/2011  . Ulcerative colitis with rectal bleeding ([place]) 01/24/2011  . Nonspecific abnormal finding in stool contents 01/24/2011  . Heme positive stool 01/11/2011  . Diarrhea 01/11/2011  . Arthritis associated with inflammatory bowel disease 01/11/2011  . Intentional weight loss 01/11/2011    Past Medical History:  Diagnosis Date  . Anal fissure   . Arthritis    RA  . Blood transfusion without reported diagnosis   . Diabetes mellitus    no meds taken 05-01-16- elevated glucose was from predisone for UC  . Diarrhea   . Fatty liver   . GERD (gastroesophageal reflux disease)   . Gout   . Hiatal hernia   . Internal hemorrhoids   . Polyarthritis   . Sessile rectal polyp 03/27/2011  . Sinusitis   . Tuberculosis    positive PPD- had an allergic reaction to the PPD, gets yearly chest xray  . Tubular adenoma of colon   . Ulcerative colitis 01/24/2011    Family History  Problem Relation Age of Onset  . Alzheimer's disease Father   . Stroke Mother   . Cancer Mother        liver & breast   . Rheum arthritis Brother   . Rheum arthritis Brother   . Rheum arthritis Sister   . Rheum arthritis Sister   . Rheum arthritis Sister   . Colon cancer Neg Hx   . Esophageal cancer Neg Hx   . Stomach cancer Neg Hx   . Rectal cancer Neg Hx   . Colon polyps Neg Hx    Past Surgical History:  Procedure Laterality Date  . CHOLECYSTECTOMY    . COLONOSCOPY    . KNEE SURGERY     Bilateral   . POLYPECTOMY    . SHOULDER SURGERY Bilateral    Bilateral - rotator cuff repair   . TOTAL KNEE ARTHROPLASTY Bilateral    Social History   Social History Narrative   1 caffeine drink daily     There is no immunization history on file for this patient.   Objective: Vital Signs: BP 126/75 (BP Location: Left Arm, Patient Position: Sitting, Cuff Size: Normal)   Pulse (!) 59   Resp 15   Ht 5' 11"  (1.803 m)   Wt 227 lb 12.8 oz (103.3 kg)   BMI 31.77 kg/m     Physical Exam Vitals signs and nursing note reviewed.  Constitutional:      Appearance: He is well-developed.  HENT:     Head: Normocephalic and atraumatic.  Eyes:     Conjunctiva/sclera: Conjunctivae normal.     Pupils: Pupils are equal, round, and reactive to light.  Neck:     Musculoskeletal: Normal range of motion and neck supple.  Cardiovascular:     Rate and Rhythm: Normal rate and regular rhythm.     Heart sounds: Normal heart sounds.  Pulmonary:     Effort: Pulmonary effort is normal.     Breath sounds: Normal breath sounds.  Abdominal:     General: Bowel sounds are normal.     Palpations: Abdomen is soft.  Lymphadenopathy:     Cervical: No cervical adenopathy.  Skin:    General: Skin is warm and dry.     Capillary Refill: Capillary refill takes less than 2 seconds.  Neurological:     Mental Status: He is alert and oriented to person, place, and time.  Psychiatric:        Behavior: Behavior normal.      Musculoskeletal Exam: C-spine, thoracic spine, and lumbar spine good ROM.  No midline spinal tenderness.  No SI joint tenderness.  Shoulder joints, wrist joints, MCPs, PIPs, DIPs good ROM with no synovitis.  Right elbow joint contracture.  No tenderness of MCP joints.  He has PIP and DIP synovial thickening consistent with osteoarthritis of bilateral hands.  Hip joints good range of motion with no discomfort.  Bilateral knee replacements have good range of motion with no discomfort.  He has warmth of both knee replacements.  Ankle joints good range of motion with no tenderness or synovitis.  No tenderness over trochanteric bursa bilaterally.   CDAI Exam: CDAI Score: 1  Patient Global Assessment: 5 (mm); Provider Global Assessment: 5 (mm) Swollen: 0 ; Tender: 0  Joint Exam   Not documented   There is currently no information documented on the homunculus. Go to the Rheumatology activity and complete the homunculus joint exam.  Investigation: No additional  findings.  Imaging: Dg Chest 2 View  Result Date: 09/18/2018 CLINICAL DATA:  History of immunosuppressive therapy. History of positive TB test. EXAM: CHEST - 2 VIEW COMPARISON:  09/15/2017 FINDINGS: The heart size and mediastinal contours are within normal limits. Both lungs are clear. The visualized skeletal structures are unremarkable. IMPRESSION: No active cardiopulmonary disease.  No change since the prior study. Electronically Signed   By: Lorriane Shire M.D.   On: 09/18/2018 12:40    Recent Labs: Lab Results  Component Value Date   WBC 7.2 09/17/2018   HGB 15.5 09/17/2018   PLT 256 09/17/2018   NA 138 09/17/2018   K 4.4 09/17/2018   CL 101 09/17/2018   CO2 30 09/17/2018   GLUCOSE 107 (H) 09/17/2018   BUN 16 09/17/2018   CREATININE 0.95 09/17/2018   BILITOT 1.0 09/17/2018   ALKPHOS 65 11/19/2016   AST 34 09/17/2018   ALT 39 09/17/2018   PROT 7.5 09/17/2018   ALBUMIN 4.4 11/19/2016   CALCIUM 9.5 09/17/2018   GFRAA 95 09/17/2018    Speciality Comments: Prior therapy: Humira and Remicaide (inadequate response)  Procedures:  No procedures performed Allergies: Patient has no known allergies.   Assessment / Plan:     Visit Diagnoses: Seronegative arthritis - inflammatory, erosive: He has no synovitis on exam.  He has not had any recent rheumatoid arthritis flares.  He continues to have chronic pain in bilateral hands and bilateral ankle joints.  He has synovial thickening but no synovitis.  He has been having increased arthralgias and stiffness in the winter most likely due to osteoarthritis.  He seems to clinically be doing well on Cimzia 400 mg subcutaneous monthly injections, methotrexate 0.6 subcu weekly injections, and folic acid 1 mg by mouth daily.  He has not missed any doses recently.  He has not had any recent infections.  He will continue on his current treatment regimen.  He does not need any refills at this time.  He was advised to notify us if he develops increased  joint pain or joint swelling.  High risk medication use - Cimzia 400 mg subcu monthly-given in the office, MTX 0.6 mL subcu weekly, folic acid 1 mg  p.o. daily (patient has failed him Humira and Remicade in the past.) History of positive TB gold and last CXR 09/15/2017. CXR on 09/18/18 did not reveal any active cardiopulmonary disease-no change from comparision.  CBC and CMP WNL on 09/17/18.   Idiopathic chronic gout of multiple sites without tophus - He has not had any recent gout flares.  he takes allopurinol 300 mg po daily and mitigare 0.6 mg po daily. uric acid: 02/27/2018 5.7.  A future order for uric acid was placed today.  He was advised to notify us if he develops any signs or symptoms of a flare.  - Plan: Uric acid  Other ulcerative colitis with other complication [facility]): He has not had any recent flares.  He has no blood in his stool.  He is following up with his GI specialist next week.  Primary osteoarthritis of both hands: He has PIP and DIP synovial thickening consistent with osteoarthritis of bilateral hands.  He has noticed decreased grip strength.  He has no synovitis or tenderness.  Joint protection and muscle strengthening were discussed.  Contracture of right elbow - due to old injury: No tenderness or synovitis.  Positive TB test - 2011.  Most recent chest x-ray was on 09/18/2018 that revealed no active cardiopulmonary disease.  No change since the prior study.  He will continue to get yearly chest x-rays.  History of bilateral rotator cuff tear repair: He has discomfort with abduction and forward flexion.  History of total bilateral knee replacement - Dr. Dewitt Rota well.  No discomfort at this time.  He has good range of motion with no discomfort.  He has warmth of bilateral knee replacements.  Other medical conditions are listed as follows:  History of gastroesophageal reflux (GERD)  History of cholecystectomy   Orders: Orders Placed This Encounter  Procedures  . Uric acid    No orders of the defined types were placed in this encounter.    Follow-Up Instructions: Return in about 5 months (around 02/21/2019) for Rheumatoid arthritis, UC, Gout, Osteoarthritis.   Ofilia Neas, PA-C  Note - This record has been created using Dragon software.  Chart creation errors have been sought, but may not always  have been located. Such creation errors do not reflect on  the standard of medical care.

## 2018-09-12 ENCOUNTER — Other Ambulatory Visit: Payer: Self-pay | Admitting: Internal Medicine

## 2018-09-12 DIAGNOSIS — K219 Gastro-esophageal reflux disease without esophagitis: Secondary | ICD-10-CM

## 2018-09-14 ENCOUNTER — Other Ambulatory Visit: Payer: Self-pay | Admitting: Internal Medicine

## 2018-09-14 DIAGNOSIS — K219 Gastro-esophageal reflux disease without esophagitis: Secondary | ICD-10-CM

## 2018-09-16 ENCOUNTER — Telehealth: Payer: Self-pay | Admitting: Internal Medicine

## 2018-09-16 DIAGNOSIS — K219 Gastro-esophageal reflux disease without esophagitis: Secondary | ICD-10-CM

## 2018-09-16 MED ORDER — PANTOPRAZOLE SODIUM 40 MG PO TBEC: 40.0000 mg | DELAYED_RELEASE_TABLET | Freq: Every day | ORAL | 0 refills | Status: DC

## 2018-09-16 NOTE — Telephone Encounter (Signed)
PT would like a refill for pantoprazolen PROTONIX

## 2018-09-16 NOTE — Telephone Encounter (Signed)
Rx sent until pt appointment.

## 2018-09-17 ENCOUNTER — Ambulatory Visit (INDEPENDENT_AMBULATORY_CARE_PROVIDER_SITE_OTHER): Payer: Medicare Other | Admitting: *Deleted

## 2018-09-17 ENCOUNTER — Other Ambulatory Visit: Payer: Self-pay | Admitting: *Deleted

## 2018-09-17 VITALS — BP 134/84 | HR 68

## 2018-09-17 DIAGNOSIS — Z9225 Personal history of immunosupression therapy: Secondary | ICD-10-CM

## 2018-09-17 DIAGNOSIS — Z79899 Other long term (current) drug therapy: Secondary | ICD-10-CM

## 2018-09-17 DIAGNOSIS — M1380 Other specified arthritis, unspecified site: Secondary | ICD-10-CM | POA: Diagnosis not present

## 2018-09-17 MED ORDER — CERTOLIZUMAB PEGOL 2 X 200 MG ~~LOC~~ KIT
400.0000 mg | PACK | Freq: Once | SUBCUTANEOUS | Status: AC
  Administered 2018-09-17: 400 mg via SUBCUTANEOUS

## 2018-09-17 NOTE — Progress Notes (Signed)
Patient in office for Cimzia injection. Patient denies fever, infection or use of antibiotics. Patient was given injections in right and left lower abdomen. Patient tolerated injections well. Patient has labs performed at this visit. Order placed for yearly chest x-ray.   Administrations This Visit    Certolizumab Pegol KIT 400 mg    Admin Date 09/17/2018 Action Given Dose 400 mg Route Subcutaneous Administered By Carole Binning, LPN

## 2018-09-18 ENCOUNTER — Ambulatory Visit (HOSPITAL_COMMUNITY)
Admission: RE | Admit: 2018-09-18 | Discharge: 2018-09-18 | Disposition: A | Discharge status: Home / Self Care | Payer: Medicare Other | Source: Ambulatory Visit | Attending: Rheumatology | Admitting: Rheumatology

## 2018-09-18 DIAGNOSIS — Z8611 Personal history of tuberculosis: Secondary | ICD-10-CM | POA: Diagnosis not present

## 2018-09-18 DIAGNOSIS — Z9225 Personal history of immunosupression therapy: Secondary | ICD-10-CM | POA: Insufficient documentation

## 2018-09-18 LAB — CBC WITH DIFFERENTIAL/PLATELET
Absolute Monocytes: 619 cells/uL (ref 200–950)
BASOS ABS: 43 {cells}/uL (ref 0–200)
Basophils Relative: 0.6 %
EOS PCT: 3.9 %
Eosinophils Absolute: 281 cells/uL (ref 15–500)
HEMATOCRIT: 45.4 % (ref 38.5–50.0)
Hemoglobin: 15.5 g/dL (ref 13.2–17.1)
Lymphs Abs: 2743 cells/uL (ref 850–3900)
MCH: 32.1 pg (ref 27.0–33.0)
MCHC: 34.1 g/dL (ref 32.0–36.0)
MCV: 94 fL (ref 80.0–100.0)
MONOS PCT: 8.6 %
MPV: 11.4 fL (ref 7.5–12.5)
NEUTROS PCT: 48.8 %
Neutro Abs: 3514 cells/uL (ref 1500–7800)
Platelets: 256 10*3/uL (ref 140–400)
RBC: 4.83 10*6/uL (ref 4.20–5.80)
RDW: 14 % (ref 11.0–15.0)
Total Lymphocyte: 38.1 %
WBC: 7.2 10*3/uL (ref 3.8–10.8)

## 2018-09-18 LAB — COMPLETE METABOLIC PANEL WITH GFR
AG RATIO: 1.5 (calc) (ref 1.0–2.5)
ALT: 39 U/L (ref 9–46)
AST: 34 U/L (ref 10–35)
Albumin: 4.5 g/dL (ref 3.6–5.1)
Alkaline phosphatase (APISO): 50 U/L (ref 35–144)
BUN: 16 mg/dL (ref 7–25)
CALCIUM: 9.5 mg/dL (ref 8.6–10.3)
CO2: 30 mmol/L (ref 20–32)
Chloride: 101 mmol/L (ref 98–110)
Creat: 0.95 mg/dL (ref 0.70–1.25)
GFR, EST NON AFRICAN AMERICAN: 82 mL/min/{1.73_m2} (ref >=60)
GFR, Est African American: 95 mL/min/{1.73_m2} (ref >=60)
GLUCOSE: 107 mg/dL — AB (ref 65–99)
Globulin: 3 g/dL (calc) (ref 1.9–3.7)
POTASSIUM: 4.4 mmol/L (ref 3.5–5.3)
Sodium: 138 mmol/L (ref 135–146)
Total Bilirubin: 1 mg/dL (ref 0.2–1.2)
Total Protein: 7.5 g/dL (ref 6.1–8.1)

## 2018-09-18 NOTE — Progress Notes (Signed)
WNLs

## 2018-09-21 ENCOUNTER — Encounter: Payer: Self-pay | Admitting: Physician Assistant

## 2018-09-21 ENCOUNTER — Telehealth: Payer: Self-pay | Admitting: Pharmacy Technician

## 2018-09-21 ENCOUNTER — Ambulatory Visit (INDEPENDENT_AMBULATORY_CARE_PROVIDER_SITE_OTHER): Payer: Medicare Other | Admitting: Physician Assistant

## 2018-09-21 VITALS — BP 126/75 | HR 59 | Resp 15 | Ht 71.0 in | Wt 227.8 lb

## 2018-09-21 DIAGNOSIS — M1A09X0 Idiopathic chronic gout, multiple sites, without tophus (tophi): Secondary | ICD-10-CM | POA: Diagnosis not present

## 2018-09-21 DIAGNOSIS — M19042 Primary osteoarthritis, left hand: Secondary | ICD-10-CM

## 2018-09-21 DIAGNOSIS — R7611 Nonspecific reaction to tuberculin skin test without active tuberculosis: Secondary | ICD-10-CM | POA: Diagnosis not present

## 2018-09-21 DIAGNOSIS — M1380 Other specified arthritis, unspecified site: Secondary | ICD-10-CM

## 2018-09-21 DIAGNOSIS — Z9049 Acquired absence of other specified parts of digestive tract: Secondary | ICD-10-CM | POA: Diagnosis not present

## 2018-09-21 DIAGNOSIS — Z79899 Other long term (current) drug therapy: Secondary | ICD-10-CM

## 2018-09-21 DIAGNOSIS — Z8719 Personal history of other diseases of the digestive system: Secondary | ICD-10-CM | POA: Diagnosis not present

## 2018-09-21 DIAGNOSIS — M19041 Primary osteoarthritis, right hand: Secondary | ICD-10-CM

## 2018-09-21 DIAGNOSIS — Z96653 Presence of artificial knee joint, bilateral: Secondary | ICD-10-CM

## 2018-09-21 DIAGNOSIS — M24521 Contracture, right elbow: Secondary | ICD-10-CM | POA: Diagnosis not present

## 2018-09-21 DIAGNOSIS — M12819 Other specific arthropathies, not elsewhere classified, unspecified shoulder: Secondary | ICD-10-CM | POA: Diagnosis not present

## 2018-09-21 DIAGNOSIS — K51818 Other ulcerative colitis with other complication: Secondary | ICD-10-CM | POA: Diagnosis not present

## 2018-09-21 NOTE — Patient Instructions (Signed)
Standing Labs We placed an order today for your standing lab work.    Please come back and get your standing labs in June and every 3 months   We have open lab Monday through Friday from 8:30-11:30 AM and 1:30-4:00 PM  at the office of Dr. Bo Merino.   You may experience shorter wait times on Monday and Friday afternoons. The office is located at [address] [place], [place], [place] [address] No appointment is necessary.   Labs are drawn by [facility].  You may receive a bill from [facility] for your lab work.  If you wish to have your labs drawn at another location, please call the office 24 hours in advance to send orders.  If you have any questions regarding directions or hours of operation,  please call [phone].   Just as a reminder please drink plenty of water prior to coming for your lab work. Thanks!

## 2018-09-21 NOTE — Telephone Encounter (Signed)
Patient discussed high copay for his Mitigare, copay is around $50 for 30 day supply. Discussed grants with patient, but he advised that he makes too much. Called [facility] to see about a Tier exception, but was advised that since the medication is already a preferred branded medication, the tier exception could not be submitted. Ran test claim for Colcrys, which is the other plan preferred medication and it is also $47 for 1 month supply.   Discussed with patient, he had no other questions at this time.

## 2018-09-29 ENCOUNTER — Ambulatory Visit (INDEPENDENT_AMBULATORY_CARE_PROVIDER_SITE_OTHER): Payer: Medicare Other | Admitting: Physician Assistant

## 2018-09-29 ENCOUNTER — Encounter: Payer: Self-pay | Admitting: Physician Assistant

## 2018-09-29 ENCOUNTER — Other Ambulatory Visit: Payer: Self-pay

## 2018-09-29 DIAGNOSIS — K219 Gastro-esophageal reflux disease without esophagitis: Secondary | ICD-10-CM

## 2018-09-29 MED ORDER — PANTOPRAZOLE SODIUM 40 MG PO TBEC: 40.0000 mg | DELAYED_RELEASE_TABLET | Freq: Every day | ORAL | 3 refills | Status: DC

## 2018-09-29 NOTE — Patient Instructions (Signed)
If you are age 69 or older, your body mass index should be between 23-30. Your Body mass index is 31.35 kg/m. If this is out of the aforementioned range listed, please consider follow up with your Primary Care Provider.  If you are age 76 or younger, your body mass index should be between 19-25. Your Body mass index is 31.35 kg/m. If this is out of the aformentioned range listed, please consider follow up with your Primary Care Provider.   We have sent the following medications to your pharmacy for you to pick up at your convenience: Pantoprazole  Follow up as needed.  Thank you for choosing me and [facility].    Ellouise Newer, PA-C   To help prevent the possible spread of infection to our patients, communities, and staff; we will be implementing the following measures:  Please only allow one visitor/family member to accompany you to any upcoming appointments with [facility]. If you have any concerns about this please contact our office to discuss prior to the appointment.

## 2018-09-29 NOTE — Progress Notes (Addendum)
Chief Complaint: GERD  HPI:    Mr. Christopher Smith is a 68 year old Caucasian male with a past medical history as listed below including GERD and chronic pancolonic ulcerative colitis for which he sees Dr. Hilarie Fredrickson, he presents to clinic today with a complaint of reflux and need for refills.    Today, the patient tells me he ran out of his Pantoprazole about 3 weeks ago.  Without this medicine it is "hell".  Describes constant symptoms of heartburn and reflux making him feel like he needs to vomit.  Over the past week he has altered his diet some and been taking Rolaids and Maalox which is helped in the interim, but typically when he is on Pantoprazole 40 mg daily he has no symptoms.    Denies fever, chills, weight loss, nausea, abdominal pain or symptoms that awaken him from sleep.  Past Medical History:  Diagnosis Date  . Anal fissure   . Arthritis    RA  . Blood transfusion without reported diagnosis   . Diabetes mellitus    no meds taken 05-01-16- elevated glucose was from predisone for UC  . Diarrhea   . Fatty liver   . GERD (gastroesophageal reflux disease)   . Gout   . Hiatal hernia   . Internal hemorrhoids   . Polyarthritis   . Sessile rectal polyp 03/27/2011  . Sinusitis   . Tuberculosis    positive PPD- had an allergic reaction to the PPD, gets yearly chest xray  . Tubular adenoma of colon   . Ulcerative colitis 01/24/2011    Past Surgical History:  Procedure Laterality Date  . CHOLECYSTECTOMY    . COLONOSCOPY    . KNEE SURGERY     Bilateral   . POLYPECTOMY    . SHOULDER SURGERY Bilateral    Bilateral - rotator cuff repair   . TOTAL KNEE ARTHROPLASTY Bilateral     Current Outpatient Medications  Medication Sig Dispense Refill  . allopurinol (ZYLOPRIM) 300 MG tablet TAKE 1 TABLET BY MOUTH EVERY DAY 90 tablet 0  . CIMZIA PREFILLED 2 X 200 MG/ML KIT INJECT 400MG SUBCUTANEOUSLY EVERY 28 DAYS 1 kit 0  . folic acid (FOLVITE) 1 MG tablet Take 2 tablets (2 mg total) by mouth  daily. (Patient taking differently: Take 1 mg by mouth daily. ) 180 tablet 3  . methotrexate 50 MG/2ML injection INJECT 0.6ML IN THE SKIN ONCE PER WEEK 8 mL 0  . MITIGARE 0.6 MG CAPS TAKE 1 CAPSULE BY MOUTH DAILY 30 capsule 5  . pantoprazole (PROTONIX) 40 MG tablet Take 1 tablet (40 mg total) by mouth daily. 90 tablet 0  . Tuberculin-Allergy Syringes 27G X 1/2" 1 ML KIT Inject 1 Syringe into the skin once a week. 12 each 3   No current facility-administered medications for this visit.     Allergies as of 09/29/2018  . (No Known Allergies)    Family History  Problem Relation Age of Onset  . Alzheimer's disease Father   . Stroke Mother   . Cancer Mother        liver & breast   . Rheum arthritis Brother   . Rheum arthritis Brother   . Rheum arthritis Sister   . Rheum arthritis Sister   . Rheum arthritis Sister   . Colon cancer Neg Hx   . Esophageal cancer Neg Hx   . Stomach cancer Neg Hx   . Rectal cancer Neg Hx   . Colon polyps Neg Hx  Social History   Socioeconomic History  . Marital status: Married    Spouse name: Not on file  . Number of children: 2   . Years of education: Not on file  . Highest education level: Not on file  Occupational History  . Occupation: Arts administrator: [employer]  . Financial resource strain: Not on file  . Food insecurity:    Worry: Not on file    Inability: Not on file  . Transportation needs:    Medical: Not on file    Non-medical: Not on file  Tobacco Use  . Smoking status: Former Smoker    Packs/day: 1.00    Last attempt to quit: 07/15/1990    Years since quitting: 28.2  . Smokeless tobacco: Former Systems developer    Types: Chew  Substance and Sexual Activity  . Alcohol use: Yes    Alcohol/week: 6.0 standard drinks    Types: 6 Cans of beer per week    Comment: weekly beer  . Drug use: No  . Sexual activity: Not on file  Lifestyle  . Physical activity:    Days per week: Not on file    Minutes per  session: Not on file  . Stress: Not on file  Relationships  . Social connections:    Talks on phone: Not on file    Gets together: Not on file    Attends religious service: Not on file    Active member of club or organization: Not on file    Attends meetings of clubs or organizations: Not on file    Relationship status: Not on file  . Intimate partner violence:    Fear of current or ex partner: Not on file    Emotionally abused: Not on file    Physically abused: Not on file    Forced sexual activity: Not on file  Other Topics Concern  . Not on file  Social History Narrative   1 caffeine drink daily     Review of Systems:    Constitutional: No weight loss, fever or chills Cardiovascular: No chest pain Respiratory: No SOB Gastrointestinal: See HPI and otherwise negative   Physical Exam:  Vital signs: BP 122/82   Pulse 78   Temp 97.8 F (36.6 C)   Ht _0  (1.803 m)   Wt 224 lb 12.8 oz (102 kg)   SpO2 98%   BMI 31.35 kg/m   Constitutional:   Pleasant Caucasian male appears to be in NAD, Well developed, Well nourished, alert and cooperative Respiratory: Respirations even and unlabored. Lungs clear to auscultation bilaterally.   No wheezes, crackles, or rhonchi.  Cardiovascular: Normal S1, S2. No MRG. Regular rate and rhythm. No peripheral edema, cyanosis or pallor.  Gastrointestinal:  Soft, nondistended, nontender. No rebound or guarding. Normal bowel sounds. No appreciable masses or hepatomegaly. Rectal:  Not performed.  Psychiatric: Demonstrates good judgement and reason without abnormal affect or behaviors.  MOST RECENT LABS AND IMAGING: CBC    Component Value Date/Time   WBC 7.2 09/17/2018 0919   RBC 4.83 09/17/2018 0919   HGB 15.5 09/17/2018 0919   HGB 14.9 11/19/2016 0718   HCT 45.4 09/17/2018 0919   HCT 42.7 11/19/2016 0718   PLT 256 09/17/2018 0919   PLT 255 11/19/2016 0718   MCV 94.0 09/17/2018 0919   MCV 93 11/19/2016 0718   MCH 32.1 09/17/2018 0919    MCHC 34.1 09/17/2018 0919   RDW 14.0 09/17/2018 0919  RDW 15.3 11/19/2016 0718   LYMPHSABS 2,743 09/17/2018 0919   LYMPHSABS 2.7 11/19/2016 0718   MONOABS 640 05/09/2016 0709   EOSABS 281 09/17/2018 0919   EOSABS 0.2 11/19/2016 0718   BASOSABS 43 09/17/2018 0919   BASOSABS 0.0 11/19/2016 0718    CMP     Component Value Date/Time   NA 138 09/17/2018 0919   NA 141 11/19/2016 0718   K 4.4 09/17/2018 0919   CL 101 09/17/2018 0919   CO2 30 09/17/2018 0919   GLUCOSE 107 (H) 09/17/2018 0919   BUN 16 09/17/2018 0919   BUN 14 11/19/2016 0718   CREATININE 0.95 09/17/2018 0919   CALCIUM 9.5 09/17/2018 0919   PROT 7.5 09/17/2018 0919   PROT 7.1 11/19/2016 0718   ALBUMIN 4.4 11/19/2016 0718   AST 34 09/17/2018 0919   ALT 39 09/17/2018 0919   ALKPHOS 65 11/19/2016 0718   BILITOT 1.0 09/17/2018 0919   BILITOT 0.5 11/19/2016 0718   GFRNONAA 82 09/17/2018 0919   GFRAA 95 09/17/2018 0919    Assessment: 1.  GERD: Uncontrolled since patient has been off Pantoprazole which he ran out of about 3 weeks ago.  Plan: 1.  Refilled Pantoprazole 40 mg daily, 30-60 minutes before eating breakfast.  #90 with 3 refills 2.  Patient to follow in clinic as needed with Dr. Hilarie Fredrickson or myself.  Ellouise Newer, PA-C [facility] 09/29/2018, 9:05 AM   Addendum: Reviewed and agree with assessment and management plan. Pyrtle, Lajuan Lines, MD

## 2018-10-09 ENCOUNTER — Telehealth: Payer: Self-pay | Admitting: Rheumatology

## 2018-10-09 NOTE — Telephone Encounter (Signed)
Regarding your medications we recommend you continue them at this time as prescribed. In the event you do become sick please follow recommended procedure to hold medication until symptoms have resolved.We understand concerns about increased risk of infection. If you do choose to stop your medications understand you are putting yourself at risk for a flare. Patient states he is going to hold his medications until May 2020. Patient advised to contact the office if he begins to flare.

## 2018-10-09 NOTE — Telephone Encounter (Signed)
Patient called checking if he is due for a Cimzia injection and if he can postpone it for at least a month.  Patient is worried that the injection weakens his immune system.  Patient requested a return call.

## 2018-10-27 ENCOUNTER — Other Ambulatory Visit: Payer: Self-pay | Admitting: Rheumatology

## 2018-10-27 NOTE — Telephone Encounter (Signed)
Last Visit: 09/21/2018 Next Visit: 02/25/2019 Labs: 09/17/2018 WNL  Uric acid: 02/27/2018 5.7  Okay to refill per Dr. Estanislado Pandy.

## 2018-10-29 ENCOUNTER — Telehealth: Payer: Self-pay | Admitting: Rheumatology

## 2018-10-29 NOTE — Telephone Encounter (Signed)
Spoke with patient and scheduled appointment for 10/30/18 at 10 am.

## 2018-10-29 NOTE — Telephone Encounter (Signed)
Patient left a message 4/16 stating he was calling to schedule his next Cimzia injection for this month. Please call to advise.

## 2018-10-30 ENCOUNTER — Ambulatory Visit (INDEPENDENT_AMBULATORY_CARE_PROVIDER_SITE_OTHER): Payer: Medicare Other | Admitting: *Deleted

## 2018-10-30 VITALS — BP 166/78 | HR 61 | Temp 97.5°F

## 2018-10-30 DIAGNOSIS — M1380 Other specified arthritis, unspecified site: Secondary | ICD-10-CM | POA: Diagnosis not present

## 2018-10-30 MED ORDER — CERTOLIZUMAB PEGOL 2 X 200 MG ~~LOC~~ KIT
400.0000 mg | PACK | Freq: Once | SUBCUTANEOUS | Status: AC
  Administered 2018-10-30: 400 mg via SUBCUTANEOUS

## 2018-10-30 NOTE — Progress Notes (Signed)
Patient in the office for Cimzia injection. Patient denies fever, use of antibiotics or infections. Patient was given injection in right and left lower abdomen. Patient tolerated injections well. Patient will return in one month for next injection.   Administrations This Visit    Certolizumab Pegol KIT 400 mg    Admin Date 10/30/2018 Action Given Dose 400 mg Route Subcutaneous Administered By Carole Binning, LPN

## 2018-11-20 ENCOUNTER — Ambulatory Visit: Payer: Self-pay

## 2018-11-27 ENCOUNTER — Other Ambulatory Visit: Payer: Self-pay

## 2018-11-27 ENCOUNTER — Ambulatory Visit (INDEPENDENT_AMBULATORY_CARE_PROVIDER_SITE_OTHER): Payer: Medicare Other | Admitting: *Deleted

## 2018-11-27 VITALS — BP 164/78 | HR 61

## 2018-11-27 DIAGNOSIS — M1380 Other specified arthritis, unspecified site: Secondary | ICD-10-CM

## 2018-11-27 MED ORDER — CERTOLIZUMAB PEGOL 2 X 200 MG ~~LOC~~ KIT
400.0000 mg | PACK | Freq: Once | SUBCUTANEOUS | Status: AC
  Administered 2018-11-27: 400 mg via SUBCUTANEOUS

## 2018-11-27 NOTE — Progress Notes (Signed)
Patient in office for Cimzia injection. Patient denies fever, infection or use of antibiotics. Patient was given injection in right and left lower abdomen. Patient tolerated injections well. Patient will return in one month for next injection.   Administrations This Visit    Certolizumab Pegol KIT 400 mg    Admin Date 11/27/2018 Action Given Dose 400 mg Route Subcutaneous Administered By Carole Binning, LPN

## 2018-12-25 ENCOUNTER — Ambulatory Visit: Payer: Medicare Other

## 2019-01-04 ENCOUNTER — Telehealth: Payer: Self-pay | Admitting: Rheumatology

## 2019-01-04 ENCOUNTER — Other Ambulatory Visit: Payer: Self-pay | Admitting: Rheumatology

## 2019-01-04 NOTE — Telephone Encounter (Signed)
Patient left a voicemail requesting a return call to reschedule his Cimzia injection.

## 2019-01-04 NOTE — Telephone Encounter (Signed)
Last Visit: 09/21/2018 Next Visit: 02/25/2019  Okay to refill per Dr. Estanislado Pandy

## 2019-01-04 NOTE — Telephone Encounter (Signed)
Patient has been scheduled for Cimzia on 01/07/19 at 10 am.

## 2019-01-07 ENCOUNTER — Ambulatory Visit (INDEPENDENT_AMBULATORY_CARE_PROVIDER_SITE_OTHER): Payer: Medicare Other | Admitting: *Deleted

## 2019-01-07 ENCOUNTER — Other Ambulatory Visit: Payer: Self-pay

## 2019-01-07 VITALS — BP 163/80 | HR 57

## 2019-01-07 DIAGNOSIS — M1A09X0 Idiopathic chronic gout, multiple sites, without tophus (tophi): Secondary | ICD-10-CM

## 2019-01-07 DIAGNOSIS — M1380 Other specified arthritis, unspecified site: Secondary | ICD-10-CM | POA: Diagnosis not present

## 2019-01-07 DIAGNOSIS — Z79899 Other long term (current) drug therapy: Secondary | ICD-10-CM

## 2019-01-07 MED ORDER — CERTOLIZUMAB PEGOL 2 X 200 MG ~~LOC~~ KIT
400.0000 mg | PACK | Freq: Once | SUBCUTANEOUS | Status: AC
  Administered 2019-01-07: 400 mg via SUBCUTANEOUS

## 2019-01-07 NOTE — Progress Notes (Signed)
Patient in office for Cimzia injection. Patient denies infection, fever or use of antibiotics. Patient given injection in right and left lower abdomen. Patient tolerated injections well. Patient will return in one month for next injection.   Administrations This Visit    Certolizumab Pegol KIT 400 mg    Admin Date 01/07/2019 Action Given Dose 400 mg Route Subcutaneous Administered By Carole Binning, LPN

## 2019-01-08 LAB — CBC WITH DIFFERENTIAL/PLATELET
Absolute Monocytes: 678 cells/uL (ref 200–950)
Basophils Absolute: 39 cells/uL (ref 0–200)
Basophils Relative: 0.5 %
Eosinophils Absolute: 154 cells/uL (ref 15–500)
Eosinophils Relative: 2 %
HCT: 44 % (ref 38.5–50.0)
Hemoglobin: 15 g/dL (ref 13.2–17.1)
Lymphs Abs: 2526 cells/uL (ref 850–3900)
MCH: 33.1 pg — ABNORMAL HIGH (ref 27.0–33.0)
MCHC: 34.1 g/dL (ref 32.0–36.0)
MCV: 97.1 fL (ref 80.0–100.0)
MPV: 10.7 fL (ref 7.5–12.5)
Monocytes Relative: 8.8 %
Neutro Abs: 4304 cells/uL (ref 1500–7800)
Neutrophils Relative %: 55.9 %
Platelets: 249 10*3/uL (ref 140–400)
RBC: 4.53 10*6/uL (ref 4.20–5.80)
RDW: 14.8 % (ref 11.0–15.0)
Total Lymphocyte: 32.8 %
WBC: 7.7 10*3/uL (ref 3.8–10.8)

## 2019-01-08 LAB — COMPLETE METABOLIC PANEL WITH GFR
AG Ratio: 1.4 (calc) (ref 1.0–2.5)
ALT: 47 U/L — ABNORMAL HIGH (ref 9–46)
AST: 34 U/L (ref 10–35)
Albumin: 4.3 g/dL (ref 3.6–5.1)
Alkaline phosphatase (APISO): 51 U/L (ref 35–144)
BUN: 15 mg/dL (ref 7–25)
CO2: 28 mmol/L (ref 20–32)
Calcium: 9.4 mg/dL (ref 8.6–10.3)
Chloride: 102 mmol/L (ref 98–110)
Creat: 0.93 mg/dL (ref 0.70–1.25)
GFR, Est African American: 97 mL/min/{1.73_m2} (ref >=60)
GFR, Est Non African American: 84 mL/min/{1.73_m2} (ref >=60)
Globulin: 3.1 g/dL (calc) (ref 1.9–3.7)
Glucose, Bld: 100 mg/dL — ABNORMAL HIGH (ref 65–99)
Potassium: 4.7 mmol/L (ref 3.5–5.3)
Sodium: 139 mmol/L (ref 135–146)
Total Bilirubin: 0.8 mg/dL (ref 0.2–1.2)
Total Protein: 7.4 g/dL (ref 6.1–8.1)

## 2019-01-08 LAB — URIC ACID: Uric Acid, Serum: 5.7 mg/dL (ref 4.0–8.0)

## 2019-01-08 NOTE — Progress Notes (Signed)
Uric acid is within desirable range.

## 2019-01-19 ENCOUNTER — Other Ambulatory Visit: Payer: Self-pay | Admitting: Rheumatology

## 2019-01-19 NOTE — Telephone Encounter (Addendum)
Last Visit:09/21/2018 Next Visit:02/25/2019 Labs: 01/07/19 ALT is borderline elevated-47. ALT of 46 is normal. We will continue to monitor. Rest of lab work is WNL  Okay to refill per Dr. Estanislado Pandy

## 2019-01-27 ENCOUNTER — Other Ambulatory Visit: Payer: Self-pay | Admitting: Rheumatology

## 2019-01-27 DIAGNOSIS — E1122 Type 2 diabetes mellitus with diabetic chronic kidney disease: Secondary | ICD-10-CM | POA: Diagnosis not present

## 2019-01-27 DIAGNOSIS — E785 Hyperlipidemia, unspecified: Secondary | ICD-10-CM | POA: Diagnosis not present

## 2019-01-27 DIAGNOSIS — Z1159 Encounter for screening for other viral diseases: Secondary | ICD-10-CM | POA: Diagnosis not present

## 2019-01-27 DIAGNOSIS — Z125 Encounter for screening for malignant neoplasm of prostate: Secondary | ICD-10-CM | POA: Diagnosis not present

## 2019-01-27 NOTE — Telephone Encounter (Signed)
Last Visit:09/21/2018 Next Visit:02/25/2019 Labs: 01/07/19 ALT is borderline elevated-47. ALT of 46 is normal. We will continue to monitor. Rest of lab work is WNL  Okay to refill per Dr. Estanislado Pandy

## 2019-02-03 DIAGNOSIS — Z125 Encounter for screening for malignant neoplasm of prostate: Secondary | ICD-10-CM | POA: Diagnosis not present

## 2019-02-03 DIAGNOSIS — E785 Hyperlipidemia, unspecified: Secondary | ICD-10-CM | POA: Diagnosis not present

## 2019-02-03 DIAGNOSIS — E1122 Type 2 diabetes mellitus with diabetic chronic kidney disease: Secondary | ICD-10-CM | POA: Diagnosis not present

## 2019-02-03 DIAGNOSIS — K5190 Ulcerative colitis, unspecified, without complications: Secondary | ICD-10-CM | POA: Diagnosis not present

## 2019-02-03 DIAGNOSIS — K219 Gastro-esophageal reflux disease without esophagitis: Secondary | ICD-10-CM | POA: Diagnosis not present

## 2019-02-03 DIAGNOSIS — M109 Gout, unspecified: Secondary | ICD-10-CM | POA: Diagnosis not present

## 2019-02-03 DIAGNOSIS — N182 Chronic kidney disease, stage 2 (mild): Secondary | ICD-10-CM | POA: Diagnosis not present

## 2019-02-03 DIAGNOSIS — F3340 Major depressive disorder, recurrent, in remission, unspecified: Secondary | ICD-10-CM | POA: Diagnosis not present

## 2019-02-03 DIAGNOSIS — Z0000 Encounter for general adult medical examination without abnormal findings: Secondary | ICD-10-CM | POA: Diagnosis not present

## 2019-02-04 ENCOUNTER — Ambulatory Visit: Payer: Medicare Other

## 2019-02-08 ENCOUNTER — Ambulatory Visit (INDEPENDENT_AMBULATORY_CARE_PROVIDER_SITE_OTHER): Payer: Medicare Other | Admitting: *Deleted

## 2019-02-08 ENCOUNTER — Other Ambulatory Visit: Payer: Self-pay

## 2019-02-08 VITALS — BP 158/76 | HR 66

## 2019-02-08 DIAGNOSIS — M1380 Other specified arthritis, unspecified site: Secondary | ICD-10-CM

## 2019-02-08 MED ORDER — CERTOLIZUMAB PEGOL 2 X 200 MG ~~LOC~~ KIT
400.0000 mg | PACK | Freq: Once | SUBCUTANEOUS | Status: AC
  Administered 2019-02-08: 400 mg via SUBCUTANEOUS

## 2019-02-08 NOTE — Progress Notes (Signed)
Patient in office for Cimzia injection. Patient denies fever, infection or use of antibiotics. Patient was given injection in right and lower left abdomen. Patient tolerated injection well. Patient will return in 1 month for next injection.   Administrations This Visit    Certolizumab Pegol KIT 400 mg    Admin Date 02/08/2019 Action Given Dose 400 mg Route Subcutaneous Administered By Carole Binning, LPN

## 2019-02-11 NOTE — Progress Notes (Signed)
Office Visit Note  Patient: Christopher Smith             Date of Birth: 1951-05-03           MRN: [id number]             PCP: Thressa Sheller, MD (Inactive) Referring: No ref. provider found Visit Date: 02/25/2019 Occupation: @GUAROCC @  Subjective:  Pain in both hands    History of Present Illness: Christopher Smith is a 68 y.o. male with history of seronegative rheumatoid arthritis, ulcerative colitis, gout, and osteoarthritis.  He receives in office Cimzia subcutaneous injections every 28 days, methotrexate 0.6 mL subcutaneous weekly injections and folic acid 1 mg by mouth daily.  He has not missed any doses recently.  He denies any recent flares.  He continues to have chronic pain in bilateral hands.  He states that he has had decreased grip strength and difficulties with ADLs due to discomfort.  He has difficulty making a complete fist.  He presents today with a right ring trigger finger that started about 1 month ago.  He would like a cortisone injection today.  He continues to have pain in bilateral knee replacements.  He has been experiencing increased lower back pain and has pain at night and often has to sleep in the recliner.  He denies any recent gout flares.  He takes allopurinol 300 mg by mouth daily and Mitigare 0.6 mg 1 tablet by mouth daily.  He denies any recent ulcerative colitis flares.  He denies any recent blood in his stool  Activities of Daily Living:  Patient reports morning stiffness for 15-20 minutes.   Patient Reports nocturnal pain.  Difficulty dressing/grooming: Reports Difficulty climbing stairs: Denies Difficulty getting out of chair: Reports Difficulty using hands for taps, buttons, cutlery, and/or writing: Reports  Review of Systems  Constitutional: Negative for fatigue and night sweats.  HENT: Negative for mouth sores, mouth dryness and nose dryness.   Eyes: Negative for redness and dryness.  Respiratory: Negative for cough, hemoptysis, shortness of  breath and difficulty breathing.   Cardiovascular: Negative for chest pain, palpitations, hypertension, irregular heartbeat and swelling in legs/feet.  Gastrointestinal: Positive for diarrhea. Negative for blood in stool and constipation.  Endocrine: Negative for increased urination.  Genitourinary: Negative for painful urination.  Musculoskeletal: Positive for arthralgias, joint pain, joint swelling and morning stiffness. Negative for myalgias, muscle weakness, muscle tenderness and myalgias.  Skin: Negative for color change, rash, hair loss, nodules/bumps, skin tightness, ulcers and sensitivity to sunlight.  Allergic/Immunologic: Negative for susceptible to infections.  Neurological: Negative for dizziness, fainting, memory loss, night sweats and weakness.  Hematological: Negative for swollen glands.  Psychiatric/Behavioral: Positive for sleep disturbance. Negative for depressed mood and confusion. The patient is not nervous/anxious.     PMFS History:  Patient Active Problem List   Diagnosis Date Noted   Open fracture of one or more phalanges of hand 11/21/2017   Chronic inflammatory arthritis 08/05/2016   High risk medication use 08/05/2016   Contracture of elbow, right 08/05/2016   History of total knee replacement, bilateral 08/05/2016   Idiopathic chronic gout 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 tophus 08/05/2016   History of bilateral rotator cuff tear repair 08/05/2016   Gastroesophageal reflux  08/05/2016   Latent tuberculosis by blood test 08/05/2016   Ulcerative colitis 09/03/2011   Glucose intolerance (impaired glucose tolerance) 09/03/2011   Severe diarrhea 09/03/2011   URI (upper respiratory infection) 07/02/2011   Bile salt-induced diarrhea 07/02/2011   S/P  cholecystectomy 07/02/2011   Ulcerative colitis, unspecified 04/03/2011   Benign neoplasm of colon 04/03/2011   Positive TB test 03/22/2011   Insomnia due to medical condition 03/22/2011   UC (ulcerative colitis)  ([place]) 03/01/2011   Leg cramps 03/01/2011   Ulcerative colitis with rectal bleeding ([place]) 01/24/2011   Nonspecific abnormal finding in stool contents 01/24/2011   Heme positive stool 01/11/2011   Diarrhea 01/11/2011   Arthritis associated with inflammatory bowel disease 01/11/2011   Intentional weight loss 01/11/2011    Past Medical History:  Diagnosis Date   Anal fissure    Arthritis    RA   Blood transfusion without reported diagnosis    Diabetes mellitus    no meds taken 05-01-16- elevated glucose was from [facility] for UC   Diarrhea    Fatty liver    GERD (gastroesophageal reflux disease)    Gout    Hiatal hernia    Internal hemorrhoids    Polyarthritis    Sessile rectal polyp 03/27/2011   Sinusitis    Tuberculosis    positive PPD- had an allergic reaction to the PPD, gets yearly chest xray   Tubular adenoma of colon    Ulcerative colitis 01/24/2011    Family History  Problem Relation Age of Onset   Alzheimer's disease Father    Stroke Mother    Cancer Mother        liver & breast    Rheum arthritis Brother    Rheum arthritis Brother    Rheum arthritis Sister    Rheum arthritis Sister    Rheum arthritis Sister    Colon cancer Neg Hx    Esophageal cancer Neg Hx    Stomach cancer Neg Hx    Rectal cancer Neg Hx    Colon polyps Neg Hx    Past Surgical History:  Procedure Laterality Date   CHOLECYSTECTOMY     COLONOSCOPY     KNEE SURGERY     Bilateral    POLYPECTOMY     SHOULDER SURGERY Bilateral    Bilateral - rotator cuff repair    TOTAL KNEE ARTHROPLASTY Bilateral    Social History   Social History Narrative   1 caffeine drink daily    Immunization History  Administered Date(s) Administered   Tdap 11/19/2017     Objective: Vital Signs: BP 133/75 (BP Location: Left Arm, Patient Position: Sitting, Cuff Size: Large)    Pulse 62    Resp 15    Ht 5' 11"  (1.803 m)    Wt 228 lb 9.6 oz (103.7 kg)    BMI 31.88  kg/m    Physical Exam Vitals signs and nursing note reviewed.  Constitutional:      Appearance: He is well-developed.  HENT:     Head: Normocephalic and atraumatic.  Eyes:     Conjunctiva/sclera: Conjunctivae normal.     Pupils: Pupils are equal, round, and reactive to light.  Neck:     Musculoskeletal: Normal range of motion and neck supple.  Cardiovascular:     Rate and Rhythm: Normal rate and regular rhythm.     Heart sounds: Normal heart sounds.  Pulmonary:     Effort: Pulmonary effort is normal.     Breath sounds: Normal breath sounds.  Abdominal:     General: Bowel sounds are normal.     Palpations: Abdomen is soft.  Skin:    General: Skin is warm and dry.     Capillary Refill: Capillary refill takes less than 2 seconds.  Neurological:     Mental Status: He is alert and oriented to person, place, and time.  Psychiatric:        Behavior: Behavior normal.      Musculoskeletal Exam: C-spine, thoracic spine, lumbar spine limited range of motion.  No midline spinal tenderness.  Shoulder joints, elbow joints, wrist joints, MCPs, PIPs, DIPs good range of motion with no synovitis.  He has PIP and DIP synovial thickening consistent with osteoarthritis of bilateral hands.  Right ring trigger finger noted.  Hip joints, ankle joints, MTPs, PIPs, DIPs good range of motion with no synovitis.  Bilateral knee replacements have good range of motion.  Left knee warmth noted but no effusion.  No tenderness or swelling of ankle joints.  CDAI Exam: CDAI Score: 1  Patient Global: 5 mm; Provider Global: 5 mm Swollen: 0 ; Tender: 0  Joint Exam   No joint exam has been documented for this visit   There is currently no information documented on the homunculus. Go to the Rheumatology activity and complete the homunculus joint exam.  Investigation: No additional findings.  Imaging: No results found.  Recent Labs: Lab Results  Component Value Date   WBC 7.7 01/07/2019   HGB 15.0  01/07/2019   PLT 249 01/07/2019   NA 139 01/07/2019   K 4.7 01/07/2019   CL 102 01/07/2019   CO2 28 01/07/2019   GLUCOSE 100 (H) 01/07/2019   BUN 15 01/07/2019   CREATININE 0.93 01/07/2019   BILITOT 0.8 01/07/2019   ALKPHOS 65 11/19/2016   AST 34 01/07/2019   ALT 47 (H) 01/07/2019   PROT 7.4 01/07/2019   ALBUMIN 4.4 11/19/2016   CALCIUM 9.4 01/07/2019   GFRAA 97 01/07/2019    Speciality Comments: Prior therapy: Humira and Remicaide (inadequate response)  Procedures:  Hand/UE Inj: R ring A1 for trigger finger on 02/25/2019 10:03 AM Indications: pain Details: 27 G needle, ultrasound-guided volar approach Medications: 0.5 mL lidocaine 1 %; 10 mg triamcinolone acetonide 40 MG/ML Aspirate: 0 mL Outcome: tolerated well, no immediate complications Procedure, treatment alternatives, risks and benefits explained, specific risks discussed. Consent was given by the patient. Immediately prior to procedure a time out was called to verify the correct patient, procedure, equipment, support staff and site/side marked as required. Patient was prepped and draped in the usual sterile fashion.     Allergies: Patient has no known allergies.     Assessment / Plan:     Visit Diagnoses: Seronegative arthritis - inflammatory, erosive: He has no synovitis on exam.  He is clinically doing well on an office Cimzia 40 mg subcutaneous injections every 28 days, methotrexate 0.6 mL subcutaneous injections every 7 days, folic acid 1 mg by mouth daily.  He has not missed any doses of his medications recently.  He continues to have chronic pain in bilateral hands.  He has no tenderness or synovitis today.  He has been having difficulty with ADLs and has noticed decreased grip strength in both hands recently.  He was given a handout of hand exercises to perform.  We also discussed using Voltaren gel topically as needed for pain relief.  He will continue on the current treatment regimen.  A refill of methotrexate will  be sent to the pharmacy today.  He was advised to notify us if he develops increased joint pain or joint swelling.  He will follow-up in the office in 5 months.  High risk medication use -Cimzia in office injection 400 mg every 28 days,  methotrexate 0.6 mL every 7 days, and folic acid 1 mg 1 tablet daily.  History of positive TB Gold.  Chest x-ray on was normal 09/18/2018 and will monitor yearly.  Most recent CBC/CMP within normal limits except for borderline elevated ALT on 01/07/2019.  Due for CBC/CMP in September will monitor every 3 months.  Standing orders are in place.  (patient has failed him Humira and Remicade in the past.)   Idiopathic chronic gout of multiple sites without tophus - He has not had any recent gout flares.  He is taking allopurinol 300 mg po daily and mitigare 0.6 mg po daily.   He does not need any refills at this time.  He was advised to notify us if he develops any signs or symptoms of a gout flare.  Other ulcerative colitis with other complication ([place]) - He has not had any recent ulcerative colitis flares.  He denies any blood in his stool recently.  Primary osteoarthritis of both hands - He has PIP and DIP synovial thickening consistent with osteoarthritis of both hands.  He has chronic pain in both hands.  He has difficulty making a complete fist.  No synovitis was noted on exam today.  He has been having increased difficulty with ADLs and has noticed decreased grip strength.  Joint protection and muscle strengthening were discussed.  He was given a handout of hand exercises to perform.  Contracture of right elbow - Chronic.  Unchanged. He has no tenderness or inflammation.   Trigger finger, right ring finger - She has a right ringer trigger finger.  She has been experiencing locking and tenderness for the past 1 month.  He requested a cortisone injection today.  He tolerated the procedure well. A splint was provided.   History of bilateral rotator cuff tear repair - He has  good ROM with some discomfort bilaterally.    History of total bilateral knee replacement - Dr. Ronnie Derby - Chronic pain.  He has good ROM.  Left knee joint warmth but no effusion noted.   Positive TB test - 2011.  Most recent chest x-ray was on 09/18/2018 that revealed no active cardiopulmonary disease.  No change since the prior study.   Other medical conditions are listed as follows:   History of gastroesophageal reflux (GERD)   Orders: Orders Placed This Encounter  Procedures   Hand/UE Inj   No orders of the defined types were placed in this encounter.   Face-to-face time spent with patient was 30 minutes. Greater than 50% of time was spent in counseling and coordination of care.  Follow-Up Instructions: Return in about 5 months (around 07/28/2019) for Rheumatoid arthritis, Gout, Osteoarthritis.   Ofilia Neas, PA-C   I examined and evaluated the patient with Hazel Sams PA.  Patient continues to have pain and discomfort in his joints.  Although he had no synovitis on examination.  His clinical findings are consistent with osteoarthritis.  He also had left fourth trigger finger.  After discussing different treatment options and side effects we decided to proceed with a cortisone injection.  The procedure was described above.  Postprocedure instructions were given.  He tolerated the procedure well.  The plan of care was discussed as noted above.  Bo Merino, MD  Note - This record has been created using Editor, commissioning.  Chart creation errors have been sought, but may not always  have been located. Such creation errors do not reflect on  the standard of medical care.

## 2019-02-25 ENCOUNTER — Ambulatory Visit (INDEPENDENT_AMBULATORY_CARE_PROVIDER_SITE_OTHER): Payer: Medicare Other | Admitting: Rheumatology

## 2019-02-25 ENCOUNTER — Other Ambulatory Visit: Payer: Self-pay | Admitting: Rheumatology

## 2019-02-25 ENCOUNTER — Other Ambulatory Visit: Payer: Self-pay

## 2019-02-25 ENCOUNTER — Encounter: Payer: Self-pay | Admitting: Physician Assistant

## 2019-02-25 VITALS — BP 133/75 | HR 62 | Resp 15 | Ht 71.0 in | Wt 228.6 lb

## 2019-02-25 DIAGNOSIS — Z96653 Presence of artificial knee joint, bilateral: Secondary | ICD-10-CM | POA: Diagnosis not present

## 2019-02-25 DIAGNOSIS — M65341 Trigger finger, right ring finger: Secondary | ICD-10-CM | POA: Diagnosis not present

## 2019-02-25 DIAGNOSIS — M12819 Other specific arthropathies, not elsewhere classified, unspecified shoulder: Secondary | ICD-10-CM | POA: Diagnosis not present

## 2019-02-25 DIAGNOSIS — R7611 Nonspecific reaction to tuberculin skin test without active tuberculosis: Secondary | ICD-10-CM

## 2019-02-25 DIAGNOSIS — M24521 Contracture, right elbow: Secondary | ICD-10-CM | POA: Diagnosis not present

## 2019-02-25 DIAGNOSIS — Z79899 Other long term (current) drug therapy: Secondary | ICD-10-CM | POA: Diagnosis not present

## 2019-02-25 DIAGNOSIS — Z8719 Personal history of other diseases of the digestive system: Secondary | ICD-10-CM | POA: Diagnosis not present

## 2019-02-25 DIAGNOSIS — M19042 Primary osteoarthritis, left hand: Secondary | ICD-10-CM | POA: Diagnosis not present

## 2019-02-25 DIAGNOSIS — M19041 Primary osteoarthritis, right hand: Secondary | ICD-10-CM

## 2019-02-25 DIAGNOSIS — M1380 Other specified arthritis, unspecified site: Secondary | ICD-10-CM | POA: Diagnosis not present

## 2019-02-25 DIAGNOSIS — K51818 Other ulcerative colitis with other complication: Secondary | ICD-10-CM | POA: Diagnosis not present

## 2019-02-25 DIAGNOSIS — M1A09X0 Idiopathic chronic gout, multiple sites, without tophus (tophi): Secondary | ICD-10-CM | POA: Diagnosis not present

## 2019-02-25 MED ORDER — METHOTREXATE SODIUM CHEMO INJECTION 50 MG/2ML: INTRAMUSCULAR | 0 refills | Status: DC

## 2019-02-25 MED ORDER — TRIAMCINOLONE ACETONIDE 40 MG/ML IJ SUSP
10.0000 mg | INTRAMUSCULAR | Status: AC | PRN
  Administered 2019-02-25: 10 mg

## 2019-02-25 MED ORDER — LIDOCAINE HCL 1 % IJ SOLN
0.5000 mL | INTRAMUSCULAR | Status: AC | PRN
  Administered 2019-02-25: .5 mL

## 2019-02-25 NOTE — Telephone Encounter (Signed)
Last Visit: 02/25/19 Next Visit: 07/29/19 Labs: 01/07/19 ALT is borderline elevated-47. ALT of 46 is normal. We will continue to monitor. Rest of lab work is WNL.   Okay to refill MTX?

## 2019-02-25 NOTE — Patient Instructions (Addendum)
Voltaren gel   Standing Labs We placed an order today for your standing lab work.    Please come back and get your standing labs in September and every 3 months   We have open lab daily Monday through Thursday from 8:30-12:30 PM and 1:30-4:30 PM and Friday from 8:30-12:30 PM and 1:30 -4:00 PM at the office of Dr. Bo Merino.   You may experience shorter wait times on Monday and Friday afternoons. The office is located at [address], [place], [place], [place] [address] No appointment is necessary.   Labs are drawn by [facility].  You may receive a bill from [facility] for your lab work.  If you wish to have your labs drawn at another location, please call the office 24 hours in advance to send orders.  If you have any questions regarding directions or hours of operation,  please call [phone].   Just as a reminder please drink plenty of water prior to coming for your lab work. Thanks!   Hand Exercises Hand exercises can be helpful for almost anyone. These exercises can strengthen the hands, improve flexibility and movement, and increase blood flow to the hands. These results can make work and daily tasks easier. Hand exercises can be especially helpful for people who have joint pain from arthritis or have nerve damage from overuse (carpal tunnel syndrome). These exercises can also help people who have injured a hand. Exercises Most of these hand exercises are gentle stretching and motion exercises. It is usually safe to do them often throughout the day. Warming up your hands before exercise may help to reduce stiffness. You can do this with gentle massage or by placing your hands in warm water for 10-15 minutes. It is normal to feel some stretching, pulling, tightness, or mild discomfort as you begin new exercises. This will gradually improve. Stop an exercise right away if you feel sudden, severe pain or your pain gets worse. Ask your health care provider which exercises are best  for you. Knuckle bend or "claw" fist 1. Stand or sit with your arm, hand, and all five fingers pointed straight up. Make sure to keep your wrist straight during the exercise. 2. Gently bend your fingers down toward your palm until the tips of your fingers are touching the top of your palm. Keep your big knuckle straight and just bend the small knuckles in your fingers. 3. Hold this position for __________ seconds. 4. Straighten (extend) your fingers back to the starting position. Repeat this exercise 5-10 times with each hand. Full finger fist 1. Stand or sit with your arm, hand, and all five fingers pointed straight up. Make sure to keep your wrist straight during the exercise. 2. Gently bend your fingers into your palm until the tips of your fingers are touching the middle of your palm. 3. Hold this position for __________ seconds. 4. Extend your fingers back to the starting position, stretching every joint fully. Repeat this exercise 5-10 times with each hand. Straight fist 1. Stand or sit with your arm, hand, and all five fingers pointed straight up. Make sure to keep your wrist straight during the exercise. 2. Gently bend your fingers at the big knuckle, where your fingers meet your hand, and the middle knuckle. Keep the knuckle at the tips of your fingers straight and try to touch the bottom of your palm. 3. Hold this position for __________ seconds. 4. Extend your fingers back to the starting position, stretching every joint fully. Repeat this  exercise 5-10 times with each hand. Tabletop 1. Stand or sit with your arm, hand, and all five fingers pointed straight up. Make sure to keep your wrist straight during the exercise. 2. Gently bend your fingers at the big knuckle, where your fingers meet your hand, as far down as you can while keeping the small knuckles in your fingers straight. Think of forming a tabletop with your fingers. 3. Hold this position for __________ seconds. 4. Extend  your fingers back to the starting position, stretching every joint fully. Repeat this exercise 5-10 times with each hand. Finger spread 1. Place your hand flat on a table with your palm facing down. Make sure your wrist stays straight as you do this exercise. 2. Spread your fingers and thumb apart from each other as far as you can until you feel a gentle stretch. Hold this position for __________ seconds. 3. Bring your fingers and thumb tight together again. Hold this position for __________ seconds. Repeat this exercise 5-10 times with each hand. Making circles 1. Stand or sit with your arm, hand, and all five fingers pointed straight up. Make sure to keep your wrist straight during the exercise. 2. Make a circle by touching the tip of your thumb to the tip of your index finger. 3. Hold for __________ seconds. Then open your hand wide. 4. Repeat this motion with your thumb and each finger on your hand. Repeat this exercise 5-10 times with each hand. Thumb motion 1. Sit with your forearm resting on a table and your wrist straight. Your thumb should be facing up toward the ceiling. Keep your fingers relaxed as you move your thumb. 2. Lift your thumb up as high as you can toward the ceiling. Hold for __________ seconds. 3. Bend your thumb across your palm as far as you can, reaching the tip of your thumb for the small finger (pinkie) side of your palm. Hold for __________ seconds. Repeat this exercise 5-10 times with each hand. Grip strengthening  1. Hold a stress ball or other soft ball in the middle of your hand. 2. Slowly increase the pressure, squeezing the ball as much as you can without causing pain. Think of bringing the tips of your fingers into the middle of your palm. All of your finger joints should bend when doing this exercise. 3. Hold your squeeze for __________ seconds, then relax. Repeat this exercise 5-10 times with each hand. Contact a health care provider if:  Your hand  pain or discomfort gets much worse when you do an exercise.  Your hand pain or discomfort does not improve within 2 hours after you exercise. If you have any of these problems, stop doing these exercises right away. Do not do them again unless your health care provider says that you can. Get help right away if:  You develop sudden, severe hand pain or swelling. If this happens, stop doing these exercises right away. Do not do them again unless your health care provider says that you can. This information is not intended to replace advice given to you by your health care provider. Make sure you discuss any questions you have with your health care provider. Document Released: 06/12/2015 Document Revised: 10/22/2018 Document Reviewed: 07/02/2018 Elsevier Patient Education  2020 [employer].

## 2019-02-25 NOTE — Telephone Encounter (Signed)
ok 

## 2019-03-08 ENCOUNTER — Ambulatory Visit (INDEPENDENT_AMBULATORY_CARE_PROVIDER_SITE_OTHER): Payer: Medicare Other | Admitting: *Deleted

## 2019-03-08 ENCOUNTER — Other Ambulatory Visit: Payer: Self-pay

## 2019-03-08 VITALS — BP 162/74 | HR 61

## 2019-03-08 DIAGNOSIS — M1380 Other specified arthritis, unspecified site: Secondary | ICD-10-CM

## 2019-03-08 MED ORDER — CERTOLIZUMAB PEGOL 2 X 200 MG ~~LOC~~ KIT
400.0000 mg | PACK | Freq: Once | SUBCUTANEOUS | Status: AC
  Administered 2019-03-08: 400 mg via SUBCUTANEOUS

## 2019-03-08 NOTE — Patient Instructions (Signed)
Standing Labs We placed an order today for your standing lab work.    Please come back and get your standing labs in September and every 3 months.   We have open lab daily Monday through Thursday from 8:30-12:30 PM and 1:30-4:30 PM and Friday from 8:30-12:30 PM and 1:30 -4:00 PM at the office of Dr. Bo Merino.   You may experience shorter wait times on Monday and Friday afternoons. The office is located at [address] [place], [place], [place] [address] No appointment is necessary.   Labs are drawn by [facility].  You may receive a bill from [facility] for your lab work.  If you wish to have your labs drawn at another location, please call the office 24 hours in advance to send orders.  If you have any questions regarding directions or hours of operation,  please call [phone].   Just as a reminder please drink plenty of water prior to coming for your lab work. Thanks!

## 2019-03-08 NOTE — Progress Notes (Signed)
Patient in office for a Cimzia injection. Patient denies fever, use of antibiotic or infection. Patient given injection in right and left lower abdomen. Patient tolerated injection well. Patient will return in one month for next injection and labs.   Administrations This Visit    Certolizumab Pegol KIT 400 mg    Admin Date 03/08/2019 Action Given Dose 400 mg Route Subcutaneous Administered By Carole Binning, LPN

## 2019-04-07 ENCOUNTER — Ambulatory Visit (INDEPENDENT_AMBULATORY_CARE_PROVIDER_SITE_OTHER): Payer: Medicare Other | Admitting: *Deleted

## 2019-04-07 ENCOUNTER — Other Ambulatory Visit: Payer: Self-pay

## 2019-04-07 VITALS — BP 156/81 | HR 61

## 2019-04-07 DIAGNOSIS — Z79899 Other long term (current) drug therapy: Secondary | ICD-10-CM | POA: Diagnosis not present

## 2019-04-07 DIAGNOSIS — M1380 Other specified arthritis, unspecified site: Secondary | ICD-10-CM | POA: Diagnosis not present

## 2019-04-07 LAB — CBC WITH DIFFERENTIAL/PLATELET
Absolute Monocytes: 761 cells/uL (ref 200–950)
Basophils Absolute: 41 cells/uL (ref 0–200)
Basophils Relative: 0.5 %
Eosinophils Absolute: 194 cells/uL (ref 15–500)
Eosinophils Relative: 2.4 %
HCT: 44.9 % (ref 38.5–50.0)
Hemoglobin: 15 g/dL (ref 13.2–17.1)
Lymphs Abs: 2406 cells/uL (ref 850–3900)
MCH: 32.1 pg (ref 27.0–33.0)
MCHC: 33.4 g/dL (ref 32.0–36.0)
MCV: 96.1 fL (ref 80.0–100.0)
MPV: 11.9 fL (ref 7.5–12.5)
Monocytes Relative: 9.4 %
Neutro Abs: 4698 cells/uL (ref 1500–7800)
Neutrophils Relative %: 58 %
Platelets: 239 10*3/uL (ref 140–400)
RBC: 4.67 10*6/uL (ref 4.20–5.80)
RDW: 14.8 % (ref 11.0–15.0)
Total Lymphocyte: 29.7 %
WBC: 8.1 10*3/uL (ref 3.8–10.8)

## 2019-04-07 LAB — COMPLETE METABOLIC PANEL WITH GFR
AG Ratio: 1.3 (calc) (ref 1.0–2.5)
ALT: 42 U/L (ref 9–46)
AST: 41 U/L — ABNORMAL HIGH (ref 10–35)
Albumin: 4.1 g/dL (ref 3.6–5.1)
Alkaline phosphatase (APISO): 45 U/L (ref 35–144)
BUN: 17 mg/dL (ref 7–25)
CO2: 26 mmol/L (ref 20–32)
Calcium: 9.4 mg/dL (ref 8.6–10.3)
Chloride: 102 mmol/L (ref 98–110)
Creat: 0.89 mg/dL (ref 0.70–1.25)
GFR, Est African American: 101 mL/min/{1.73_m2} (ref >=60)
GFR, Est Non African American: 87 mL/min/{1.73_m2} (ref >=60)
Globulin: 3.1 g/dL (calc) (ref 1.9–3.7)
Glucose, Bld: 104 mg/dL — ABNORMAL HIGH (ref 65–99)
Potassium: 4.9 mmol/L (ref 3.5–5.3)
Sodium: 138 mmol/L (ref 135–146)
Total Bilirubin: 0.8 mg/dL (ref 0.2–1.2)
Total Protein: 7.2 g/dL (ref 6.1–8.1)

## 2019-04-07 MED ORDER — CERTOLIZUMAB PEGOL 2 X 200 MG ~~LOC~~ KIT
400.0000 mg | PACK | Freq: Once | SUBCUTANEOUS | Status: AC
  Administered 2019-04-07: 400 mg via SUBCUTANEOUS

## 2019-04-07 NOTE — Patient Instructions (Signed)
Standing Labs We placed an order today for your standing lab work.    Please come back and get your standing labs in December and every 3 months.  We have open lab daily Monday through Thursday from 8:30-12:30 PM and 1:30-4:30 PM and Friday from 8:30-12:30 PM and 1:30 -4:00 PM at the office of Dr. Bo Merino.   You may experience shorter wait times on Monday and Friday afternoons. The office is located at [address], [place], [place], [place] [address] No appointment is necessary.   Labs are drawn by [facility].  You may receive a bill from [facility] for your lab work.  If you wish to have your labs drawn at another location, please call the office 24 hours in advance to send orders.  If you have any questions regarding directions or hours of operation,  please call [phone].   Just as a reminder please drink plenty of water prior to coming for your lab work. Thanks!

## 2019-04-07 NOTE — Progress Notes (Signed)
Patient in office for Cimzia injection. Patient denies fever, infection and use of antibiotics. Patient given injection in right and left lower abdomen. Patient tolerated injection well. Patient will return in 1 month for next injection. Patient having lab performed today.   Administrations This Visit    Certolizumab Pegol KIT 400 mg    Admin Date 04/07/2019 Action Given Dose 400 mg Route Subcutaneous Administered By Carole Binning, LPN

## 2019-04-08 NOTE — Progress Notes (Signed)
LFTs are mildly elevated.  Labs are stable.

## 2019-04-28 DIAGNOSIS — Z23 Encounter for immunization: Secondary | ICD-10-CM | POA: Diagnosis not present

## 2019-05-05 ENCOUNTER — Ambulatory Visit (INDEPENDENT_AMBULATORY_CARE_PROVIDER_SITE_OTHER): Payer: Medicare Other | Admitting: *Deleted

## 2019-05-05 ENCOUNTER — Other Ambulatory Visit: Payer: Self-pay

## 2019-05-05 VITALS — BP 139/71 | HR 71

## 2019-05-05 DIAGNOSIS — M1380 Other specified arthritis, unspecified site: Secondary | ICD-10-CM

## 2019-05-05 MED ORDER — CERTOLIZUMAB PEGOL 2 X 200 MG ~~LOC~~ KIT
400.0000 mg | PACK | Freq: Once | SUBCUTANEOUS | Status: AC
  Administered 2019-05-05: 400 mg via SUBCUTANEOUS

## 2019-05-05 NOTE — Progress Notes (Signed)
Patient in office for Cimzia injection. Patient denies fever, infection or use of antibiotics. Patient given injection in right and left lower abdomen. Patient tolerated injection well. Patient will return in one month for next injection.   Administrations This Visit    Certolizumab Pegol KIT 400 mg    Admin Date 05/05/2019 Action Given Dose 400 mg Route Subcutaneous Administered By Carole Binning, LPN

## 2019-06-02 ENCOUNTER — Other Ambulatory Visit: Payer: Self-pay

## 2019-06-02 ENCOUNTER — Ambulatory Visit (INDEPENDENT_AMBULATORY_CARE_PROVIDER_SITE_OTHER): Payer: Medicare Other | Admitting: *Deleted

## 2019-06-02 VITALS — BP 150/84 | HR 63

## 2019-06-02 DIAGNOSIS — M1380 Other specified arthritis, unspecified site: Secondary | ICD-10-CM

## 2019-06-02 MED ORDER — CERTOLIZUMAB PEGOL 2 X 200 MG ~~LOC~~ KIT
400.0000 mg | PACK | Freq: Once | SUBCUTANEOUS | Status: AC
  Administered 2019-06-02: 400 mg via SUBCUTANEOUS

## 2019-06-02 NOTE — Progress Notes (Signed)
Patient in office for a Cimzia injection. Patient denies fever, use of antibiotics and infection. Patient states he did fall through the deck and has a scrape on his right shin. Dr. Estanislado Pandy evaluated the scrape and gave okay to proceed with the injection. Patient given injection in right and left lower abdomen. Patient tolerated injection well and will return in one month along with labs.   Administrations This Visit    Certolizumab Pegol KIT 400 mg    Admin Date 06/02/2019 Action Given Dose 400 mg Route Subcutaneous Administered By Carole Binning, LPN

## 2019-06-14 ENCOUNTER — Other Ambulatory Visit: Payer: Self-pay | Admitting: Rheumatology

## 2019-06-14 NOTE — Telephone Encounter (Signed)
Ok to refill 

## 2019-06-14 NOTE — Telephone Encounter (Signed)
Last visit 02/25/2019 Next visit 07/29/2019 Last refukk 01/27/2019  Notes recorded by Earnestine Mealing, CMA on 04/09/2019 at 9:58 AM EDT  Advised patient LFTs are mildly elevated. Labs are stable. Patient verbalized understanding.

## 2019-06-30 ENCOUNTER — Other Ambulatory Visit: Payer: Self-pay

## 2019-06-30 ENCOUNTER — Ambulatory Visit (INDEPENDENT_AMBULATORY_CARE_PROVIDER_SITE_OTHER): Payer: Medicare Other

## 2019-06-30 ENCOUNTER — Ambulatory Visit: Payer: Medicare Other

## 2019-06-30 VITALS — BP 137/68 | HR 61

## 2019-06-30 DIAGNOSIS — Z79899 Other long term (current) drug therapy: Secondary | ICD-10-CM

## 2019-06-30 DIAGNOSIS — M1380 Other specified arthritis, unspecified site: Secondary | ICD-10-CM

## 2019-06-30 DIAGNOSIS — M1A09X0 Idiopathic chronic gout, multiple sites, without tophus (tophi): Secondary | ICD-10-CM

## 2019-06-30 MED ORDER — CERTOLIZUMAB PEGOL 2 X 200 MG ~~LOC~~ KIT
400.0000 mg | PACK | Freq: Once | SUBCUTANEOUS | Status: AC
  Administered 2019-06-30: 400 mg via SUBCUTANEOUS

## 2019-06-30 NOTE — Progress Notes (Signed)
Patient presents in office for cimzia injections. Patient denies antibiotic use, fever or signs/symptoms of infections. Patient injected in right and left lower abdomen. Patient tolerated the injections well. Patient will return for next injection in 1 month. Patient is due for labs and will get labs drawn at [facility] this week. Orders have been released.   Administrations This Visit    Certolizumab Pegol KIT 400 mg    Admin Date 06/30/2019 Action Given Dose 400 mg Route Subcutaneous Administered By Earnestine Mealing, CMA

## 2019-07-03 LAB — COMPLETE METABOLIC PANEL WITH GFR
AG Ratio: 1.3 (calc) (ref 1.0–2.5)
ALT: 40 U/L (ref 9–46)
AST: 31 U/L (ref 10–35)
Albumin: 4.3 g/dL (ref 3.6–5.1)
Alkaline phosphatase (APISO): 54 U/L (ref 35–144)
BUN: 13 mg/dL (ref 7–25)
CO2: 26 mmol/L (ref 20–32)
Calcium: 9.6 mg/dL (ref 8.6–10.3)
Chloride: 102 mmol/L (ref 98–110)
Creat: 0.95 mg/dL (ref 0.70–1.25)
GFR, Est African American: 94 mL/min/{1.73_m2} (ref >=60)
GFR, Est Non African American: 81 mL/min/{1.73_m2} (ref >=60)
Globulin: 3.2 g/dL (calc) (ref 1.9–3.7)
Glucose, Bld: 112 mg/dL — ABNORMAL HIGH (ref 65–99)
Potassium: 4.8 mmol/L (ref 3.5–5.3)
Sodium: 139 mmol/L (ref 135–146)
Total Bilirubin: 0.6 mg/dL (ref 0.2–1.2)
Total Protein: 7.5 g/dL (ref 6.1–8.1)

## 2019-07-03 LAB — CBC WITH DIFFERENTIAL/PLATELET
Absolute Monocytes: 714 cells/uL (ref 200–950)
Basophils Absolute: 50 cells/uL (ref 0–200)
Basophils Relative: 0.6 %
Eosinophils Absolute: 252 cells/uL (ref 15–500)
Eosinophils Relative: 3 %
HCT: 45 % (ref 38.5–50.0)
Hemoglobin: 15.4 g/dL (ref 13.2–17.1)
Lymphs Abs: 3259 cells/uL (ref 850–3900)
MCH: 32.8 pg (ref 27.0–33.0)
MCHC: 34.2 g/dL (ref 32.0–36.0)
MCV: 95.7 fL (ref 80.0–100.0)
MPV: 11.5 fL (ref 7.5–12.5)
Monocytes Relative: 8.5 %
Neutro Abs: 4124 cells/uL (ref 1500–7800)
Neutrophils Relative %: 49.1 %
Platelets: 242 10*3/uL (ref 140–400)
RBC: 4.7 10*6/uL (ref 4.20–5.80)
RDW: 13.9 % (ref 11.0–15.0)
Total Lymphocyte: 38.8 %
WBC: 8.4 10*3/uL (ref 3.8–10.8)

## 2019-07-03 LAB — URIC ACID: Uric Acid, Serum: 4.2 mg/dL (ref 4.0–8.0)

## 2019-07-22 ENCOUNTER — Telehealth: Payer: Self-pay | Admitting: Rheumatology

## 2019-07-22 NOTE — Telephone Encounter (Signed)
contacted patient and per patient request he needs a Thursday or Friday appointment. Patient scheduled for 08/05/19 at 10 am.

## 2019-07-22 NOTE — Telephone Encounter (Signed)
Patient called requesting a return call to schedule his next Cimzia injection.  Patient states he had his last injection on 06/30/19.

## 2019-07-29 ENCOUNTER — Ambulatory Visit: Payer: Medicare Other | Admitting: Rheumatology

## 2019-07-29 DIAGNOSIS — E785 Hyperlipidemia, unspecified: Secondary | ICD-10-CM | POA: Diagnosis not present

## 2019-07-29 DIAGNOSIS — R739 Hyperglycemia, unspecified: Secondary | ICD-10-CM | POA: Diagnosis not present

## 2019-07-29 DIAGNOSIS — I10 Essential (primary) hypertension: Secondary | ICD-10-CM | POA: Diagnosis not present

## 2019-08-05 ENCOUNTER — Ambulatory Visit (INDEPENDENT_AMBULATORY_CARE_PROVIDER_SITE_OTHER): Payer: Medicare Other | Admitting: *Deleted

## 2019-08-05 ENCOUNTER — Ambulatory Visit: Payer: Medicare Other

## 2019-08-05 ENCOUNTER — Other Ambulatory Visit: Payer: Self-pay

## 2019-08-05 VITALS — BP 145/63

## 2019-08-05 DIAGNOSIS — M1380 Other specified arthritis, unspecified site: Secondary | ICD-10-CM

## 2019-08-05 DIAGNOSIS — R7303 Prediabetes: Secondary | ICD-10-CM | POA: Diagnosis not present

## 2019-08-05 MED ORDER — CERTOLIZUMAB PEGOL 2 X 200 MG ~~LOC~~ KIT
400.0000 mg | PACK | Freq: Once | SUBCUTANEOUS | Status: AC
  Administered 2019-08-05: 400 mg via SUBCUTANEOUS

## 2019-08-05 NOTE — Progress Notes (Signed)
Patient in office for Cimzia injection. Patient denies fever, infection or use of antibiotics. Patient given injections in right and left lower abdomen. Patient tolerated injections well. Patient will return in one month for next injections.   Administrations This Visit    Certolizumab Pegol KIT 400 mg    Admin Date 08/05/2019 Action Given Dose 400 mg Route Subcutaneous Administered By Carole Binning, LPN

## 2019-08-30 ENCOUNTER — Other Ambulatory Visit: Payer: Self-pay | Admitting: Rheumatology

## 2019-08-31 NOTE — Telephone Encounter (Signed)
Last Visit: 02/25/2019 Next Visit: 09/23/2019 Labs: 07/02/2019 Glucose is elevated-112. Rest of CMP WNL. CBC WNL. Uric acid is within desirable range.   Okay to refill per Dr. Estanislado Pandy.

## 2019-09-01 ENCOUNTER — Ambulatory Visit (INDEPENDENT_AMBULATORY_CARE_PROVIDER_SITE_OTHER): Payer: Medicare Other | Admitting: *Deleted

## 2019-09-01 ENCOUNTER — Other Ambulatory Visit: Payer: Self-pay

## 2019-09-01 VITALS — BP 161/78 | HR 67

## 2019-09-01 DIAGNOSIS — M1380 Other specified arthritis, unspecified site: Secondary | ICD-10-CM

## 2019-09-01 MED ORDER — CERTOLIZUMAB PEGOL 2 X 200 MG ~~LOC~~ KIT
400.0000 mg | PACK | Freq: Once | SUBCUTANEOUS | Status: AC
  Administered 2019-09-01: 400 mg via SUBCUTANEOUS

## 2019-09-01 NOTE — Progress Notes (Signed)
Patient in office today for Cimzia injection. Patient was given injection in right and left lower abdomen. Patient tolerated injection well. Patient will return in one month for next injection and labs.Patient advised he is due to update chest x-ray in March 2021. Patient aware.   Administrations This Visit    Certolizumab Pegol KIT 400 mg    Admin Date 09/01/2019 Action Given Dose 400 mg Route Subcutaneous Administered By Carole Binning, LPN

## 2019-09-02 ENCOUNTER — Ambulatory Visit: Payer: Medicare Other

## 2019-09-08 DIAGNOSIS — Z23 Encounter for immunization: Secondary | ICD-10-CM | POA: Diagnosis not present

## 2019-09-14 ENCOUNTER — Other Ambulatory Visit: Payer: Self-pay | Admitting: Physician Assistant

## 2019-09-14 NOTE — Telephone Encounter (Signed)
Last Visit: 02/25/2019 Next Visit: 09/23/2019 Labs: 07/02/2019 Glucose is elevated-112. Rest of CMP WNL. CBC WNL. Uric acid is within desirable range.   Okay to refill per Dr. Estanislado Pandy.

## 2019-09-16 NOTE — Progress Notes (Signed)
Office Visit Note  Patient: Christopher Smith             Date of Birth: 1950-12-23           MRN: [id number]             PCP: Thressa Sheller, MD (Inactive) Referring: No ref. provider found Visit Date: 09/23/2019 Occupation: @GUAROCC @  Subjective:  Right hand pain   History of Present Illness: Christopher Smith is a 69 y.o. male with history of seronegative rheumatoid arthritis, gout, osteoarthritis, and ulcerative colitis.  He is on Cimzia 400 mg subcutaneous injections every 28 days, methotrexate 0.6 mg once weekly and folic acid 2 mg by mouth daily.  He denies any recent rheumatoid arthritis flares.  He continues to have pain in the right first, second, and third MCPs and second and third PIP joints.  He denies any joint swelling currently does have stiffness in both hands which is worse in the mornings.  He continues to have chronic lower back pain and stiffness which wakes him up at night.  He denies any recent ulcerative colitis flares.  He denies any recent gout flares.  He continues to take allopurinol 300 mg by mouth daily and colchicine 0.6 mg daily.   Activities of Daily Living:  Patient reports morning stiffness for 15 minutes.   Patient Reports nocturnal pain.  Difficulty dressing/grooming: Denies Difficulty climbing stairs: Reports Difficulty getting out of chair: Reports Difficulty using hands for taps, buttons, cutlery, and/or writing: Reports  Review of Systems  Constitutional: Negative for fatigue and night sweats.  HENT: Positive for mouth dryness. Negative for mouth sores and nose dryness.   Eyes: Negative for redness, itching and dryness.  Respiratory: Negative for shortness of breath, wheezing and difficulty breathing.   Cardiovascular: Negative for chest pain, palpitations, hypertension, irregular heartbeat and swelling in legs/feet.  Gastrointestinal: Positive for diarrhea. Negative for blood in stool and constipation.  Endocrine: Negative for increased  urination.  Genitourinary: Negative for difficulty urinating and painful urination.  Musculoskeletal: Positive for arthralgias, joint pain and morning stiffness. Negative for joint swelling, myalgias, muscle weakness, muscle tenderness and myalgias.  Skin: Negative for color change, rash, hair loss, nodules/bumps, skin tightness, ulcers and sensitivity to sunlight.  Allergic/Immunologic: Negative for susceptible to infections.  Neurological: Negative for dizziness, fainting, numbness, headaches, memory loss, night sweats and weakness.  Hematological: Negative for swollen glands.  Psychiatric/Behavioral: Positive for sleep disturbance. Negative for depressed mood and confusion. The patient is not nervous/anxious.     PMFS History:  Patient Active Problem List   Diagnosis Date Noted  . Open fracture of one or more phalanges of hand 11/21/2017  . Chronic inflammatory arthritis 08/05/2016  . High risk medication use 08/05/2016  . Contracture of elbow, right 08/05/2016  . History of total knee replacement, bilateral 08/05/2016  . 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 History of bilateral rotator cuff tear repair 08/05/2016  . Gastroesophageal reflux  08/05/2016  . Latent tuberculosis by blood test 08/05/2016  . Ulcerative colitis 09/03/2011  . Glucose intolerance (impaired glucose tolerance) 09/03/2011  . Severe diarrhea 09/03/2011  . URI (upper respiratory infection) 07/02/2011  . Bile salt-induced diarrhea 07/02/2011  . S/P cholecystectomy 07/02/2011  . Ulcerative colitis, unspecified 04/03/2011  . Benign neoplasm of colon 04/03/2011  . Positive TB test 03/22/2011  . Insomnia due to medical condition 03/22/2011  . UC (ulcerative colitis) ([place]) 03/01/2011  . Leg cramps 03/01/2011  . Ulcerative colitis with  rectal bleeding ([place]) 01/24/2011  . Nonspecific abnormal finding in stool contents 01/24/2011  . Heme positive stool 01/11/2011  . Diarrhea 01/11/2011  .  Arthritis associated with inflammatory bowel disease 01/11/2011  . Intentional weight loss 01/11/2011    Past Medical History:  Diagnosis Date  . Anal fissure   . Arthritis    RA  . Blood transfusion without reported diagnosis   . Diabetes mellitus    no meds taken 05-01-16- elevated glucose was from [facility] for UC  . Diarrhea   . Fatty liver   . GERD (gastroesophageal reflux disease)   . Gout   . Hiatal hernia   . Internal hemorrhoids   . Polyarthritis   . Sessile rectal polyp 03/27/2011  . Sinusitis   . Tuberculosis    positive PPD- had an allergic reaction to the PPD, gets yearly chest xray  . Tubular adenoma of colon   . Ulcerative colitis 01/24/2011    Family History  Problem Relation Age of Onset  . Alzheimer's disease Father   . Stroke Mother   . Cancer Mother        liver & breast   . Rheum arthritis Brother   . Rheum arthritis Brother   . Rheum arthritis Sister   . Rheum arthritis Sister   . Rheum arthritis Sister   . Colon cancer Neg Hx   . Esophageal cancer Neg Hx   . Stomach cancer Neg Hx   . Rectal cancer Neg Hx   . Colon polyps Neg Hx    Past Surgical History:  Procedure Laterality Date  . CHOLECYSTECTOMY    . COLONOSCOPY    . KNEE SURGERY     Bilateral   . POLYPECTOMY    . SHOULDER SURGERY Bilateral    Bilateral - rotator cuff repair   . TOTAL KNEE ARTHROPLASTY Bilateral    Social History   Social History Narrative   1 caffeine drink daily    Immunization History  Administered Date(s) Administered  . Tdap 11/19/2017     Objective: Vital Signs: BP 139/74 (BP Location: Right Arm, Patient Position: Sitting, Cuff Size: Normal)   Pulse 60   Resp 17   Ht 5' 11"  (1.803 m)   Wt 228 lb (103.4 kg)   BMI 31.80 kg/m    Physical Exam Vitals and nursing note reviewed.  Constitutional:      Appearance: He is well-developed.  HENT:     Head: Normocephalic and atraumatic.  Eyes:     Conjunctiva/sclera: Conjunctivae normal.     Pupils:  Pupils are equal, round, and reactive to light.  Pulmonary:     Effort: Pulmonary effort is normal.  Abdominal:     General: Bowel sounds are normal.     Palpations: Abdomen is soft.  Musculoskeletal:     Cervical back: Normal range of motion and neck supple.  Skin:    General: Skin is warm and dry.     Capillary Refill: Capillary refill takes less than 2 seconds.  Neurological:     Mental Status: He is alert and oriented to person, place, and time.  Psychiatric:        Behavior: Behavior normal.      Musculoskeletal Exam: C-spine slightly limited range of motion.  Thoracic and lumbar spine good range of motion.  Shoulder joints have good range of motion with some discomfort in the left shoulder.  Right elbow joint contracture noted.  Left elbow has good range of motion with no tenderness or  synovitis.  Wrist joints, MCPs, PIPs and DIPs good range of motion no synovitis.  Is complete fist formation bilaterally.  He has tenderness of the right first, second, and third MCP joints and right second and third PIP joints.  Hip joints have good range of motion with no discomfort.  Knee joints have good range of motion with no warmth or effusion.  Ankle joints have good range of motion no tenderness or synovitis.  CDAI Exam: CDAI Score: 5.8  Patient Global: 4 mm; Provider Global: 4 mm Swollen: 0 ; Tender: 5  Joint Exam 09/23/2019      Right  Left  MCP 1   Tender     MCP 2   Tender     MCP 3   Tender     PIP 2   Tender     PIP 3   Tender        Investigation: No additional findings.  Imaging: No results found.  Recent Labs: Lab Results  Component Value Date   WBC 8.4 07/02/2019   HGB 15.4 07/02/2019   PLT 242 07/02/2019   NA 139 07/02/2019   K 4.8 07/02/2019   CL 102 07/02/2019   CO2 26 07/02/2019   GLUCOSE 112 (H) 07/02/2019   BUN 13 07/02/2019   CREATININE 0.95 07/02/2019   BILITOT 0.6 07/02/2019   ALKPHOS 65 11/19/2016   AST 31 07/02/2019   ALT 40 07/02/2019   PROT  7.5 07/02/2019   ALBUMIN 4.4 11/19/2016   CALCIUM 9.6 07/02/2019   GFRAA 94 07/02/2019    Speciality Comments: Prior therapy: Humira and Remicaide (inadequate response)  Procedures:  No procedures performed Allergies: Patient has no known allergies.   Assessment / Plan:     Visit Diagnoses: Seronegative arthritis - inflammatory, erosive: He has no synovitis on exam today.  He has chronic pain and tenderness on exam today in the right first, second, and third MCP joints and right second and third PIP joints.  Right elbow joint contractures unchanged.  He experiences occasional discomfort in his feet but denies any joint swelling.  Overall he is clinically doing well on Cimzia 400 mg subcutaneous injections every 28 days, methotrexate 0.6 mg once weekly, and folic acid 1 mg by mouth daily.  He will continue on the current treatment regimen.  He was advised to notify us if he develops increased joint pain or joint swelling.  He will return to the office in 1 week for his next Cimzia injection.  He will follow-up in the office in 5 months for follow-up.  High risk medication use - Cimzia in office injection 400 mg every 28 days, methotrexate 0.6 mL every 7 days, and folic acid 1 mg 1 tablet daily.  Future order for chest x-ray was placed today due to his history of positive TB test.  Future orders for CBC and CMP were also placed.  He plans on obtaining lab work next week when he gets his Cimzia injection.- Plan: CBC with Differential/Platelet, COMPLETE METABOLIC PANEL WITH GFR, DG Chest 2 View  Idiopathic chronic gout of multiple sites without tophus -He has not had any recent gout flares.  He is clinically doing well on allopurinol 300 mg po daily and mitigare 0.6 mg po daily. uric acid: 07/02/2019 4.2.  He will continue taking allopurinol and Medicare as prescribed.  He is advised to notify us if he develops signs or symptoms of a gout flare.  Other ulcerative colitis with other complication [facility]):  He  has not had any recent ulcerative colitis flares.  He will continue on Cimzia as prescribed.  Primary osteoarthritis of both hands: He has PIP and DIP thickening consistent with osteoarthritis of both hands.  No inflammation was noted on exam today.  He has complete fist formation bilaterally.  Joint protection and muscle strengthening were discussed.  Contracture of right elbow: Chronic and unchanged.  No tenderness or inflammation was noted.  Trigger finger, right ring finger: Resolved  History of bilateral rotator cuff tear repair: He is experiencing some discomfort in the left shoulder with range of motion.  He attributes his discomfort to overuse activities that he has been performing recently.  History of total bilateral knee replacement - Dr. Ronnie Derby: Doing well.  He has good range of motion with no discomfort at this time.  Positive TB test - 2011.  Most recent chest x-ray was on 09/18/2018 that revealed no active cardiopulmonary disease.  Future order for chest x-ray was placed today.- Plan: DG Chest 2 View  History of gastroesophageal reflux (GERD)  Orders: Orders Placed This Encounter  Procedures  . DG Chest 2 View  . CBC with Differential/Platelet  . COMPLETE METABOLIC PANEL WITH GFR   No orders of the defined types were placed in this encounter.    Follow-Up Instructions: Return in about 5 months (around 02/23/2020) for Rheumatoid arthritis, Gout.   Ofilia Neas, PA-C   I examined and evaluated the patient with Hazel Sams PA.  I did not see any synovitis on my examination today.  The plan of care was discussed as noted above.  Bo Merino, MD  Note - This record has been created using Editor, commissioning.  Chart creation errors have been sought, but may not always  have been located. Such creation errors do not reflect on  the standard of medical care.

## 2019-09-23 ENCOUNTER — Encounter: Payer: Self-pay | Admitting: Rheumatology

## 2019-09-23 ENCOUNTER — Ambulatory Visit (INDEPENDENT_AMBULATORY_CARE_PROVIDER_SITE_OTHER): Payer: Medicare Other | Admitting: Rheumatology

## 2019-09-23 ENCOUNTER — Other Ambulatory Visit: Payer: Self-pay

## 2019-09-23 VITALS — BP 139/74 | HR 60 | Resp 17 | Ht 71.0 in | Wt 228.0 lb

## 2019-09-23 DIAGNOSIS — Z79899 Other long term (current) drug therapy: Secondary | ICD-10-CM | POA: Diagnosis not present

## 2019-09-23 DIAGNOSIS — M12819 Other specific arthropathies, not elsewhere classified, unspecified shoulder: Secondary | ICD-10-CM | POA: Diagnosis not present

## 2019-09-23 DIAGNOSIS — K51818 Other ulcerative colitis with other complication: Secondary | ICD-10-CM

## 2019-09-23 DIAGNOSIS — M65341 Trigger finger, right ring finger: Secondary | ICD-10-CM

## 2019-09-23 DIAGNOSIS — M1380 Other specified arthritis, unspecified site: Secondary | ICD-10-CM | POA: Diagnosis not present

## 2019-09-23 DIAGNOSIS — R7611 Nonspecific reaction to tuberculin skin test without active tuberculosis: Secondary | ICD-10-CM | POA: Diagnosis not present

## 2019-09-23 DIAGNOSIS — Z8719 Personal history of other diseases of the digestive system: Secondary | ICD-10-CM

## 2019-09-23 DIAGNOSIS — M19042 Primary osteoarthritis, left hand: Secondary | ICD-10-CM | POA: Diagnosis not present

## 2019-09-23 DIAGNOSIS — M19041 Primary osteoarthritis, right hand: Secondary | ICD-10-CM

## 2019-09-23 DIAGNOSIS — Z96653 Presence of artificial knee joint, bilateral: Secondary | ICD-10-CM | POA: Diagnosis not present

## 2019-09-23 DIAGNOSIS — M1A09X0 Idiopathic chronic gout, multiple sites, without tophus (tophi): Secondary | ICD-10-CM

## 2019-09-23 DIAGNOSIS — M24521 Contracture, right elbow: Secondary | ICD-10-CM

## 2019-09-29 ENCOUNTER — Other Ambulatory Visit: Payer: Self-pay

## 2019-09-29 ENCOUNTER — Ambulatory Visit (INDEPENDENT_AMBULATORY_CARE_PROVIDER_SITE_OTHER): Payer: Medicare Other | Admitting: *Deleted

## 2019-09-29 VITALS — BP 146/82 | HR 59

## 2019-09-29 DIAGNOSIS — Z79899 Other long term (current) drug therapy: Secondary | ICD-10-CM

## 2019-09-29 DIAGNOSIS — M1380 Other specified arthritis, unspecified site: Secondary | ICD-10-CM | POA: Diagnosis not present

## 2019-09-29 MED ORDER — CERTOLIZUMAB PEGOL 2 X 200 MG ~~LOC~~ KIT
400.0000 mg | PACK | Freq: Once | SUBCUTANEOUS | Status: AC
  Administered 2019-09-29: 400 mg via SUBCUTANEOUS

## 2019-09-29 NOTE — Progress Notes (Signed)
Patient in the office for Cimzia injection. Patient denies fever, infection or use of antibiotics. Patient given injection in right and left lower abdomen. Patient tolerated injection well. Patient had labs performed while in office today. Patient has been scheduled for next injection in one month.   Administrations This Visit    Certolizumab Pegol KIT 400 mg    Admin Date 09/29/2019 Action Given Dose 400 mg Route Subcutaneous Administered By Carole Binning, LPN

## 2019-09-29 NOTE — Patient Instructions (Signed)
Standing Labs We placed an order today for your standing lab work.    Please come back and get your standing labs in June and every 3 months.   We have open lab daily Monday through Thursday from 8:30-12:30 PM and 1:30-4:30 PM and Friday from 8:30-12:30 PM and 1:30-4:00 PM at the office of Dr. Bo Merino.   You may experience shorter wait times on Monday and Friday afternoons. The office is located at [address] [place], [place], [place] [address] No appointment is necessary.   Labs are drawn by [facility].  You may receive a bill from [facility] for your lab work.  If you wish to have your labs drawn at another location, please call the office 24 hours in advance to send orders.  If you have any questions regarding directions or hours of operation,  please call [phone].   Just as a reminder please drink plenty of water prior to coming for your lab work. Thanks!

## 2019-09-30 LAB — COMPLETE METABOLIC PANEL WITHOUT GFR
AG Ratio: 1.4 (calc) (ref 1.0–2.5)
ALT: 34 U/L (ref 9–46)
AST: 33 U/L (ref 10–35)
Albumin: 4.5 g/dL (ref 3.6–5.1)
Alkaline phosphatase (APISO): 51 U/L (ref 35–144)
BUN: 15 mg/dL (ref 7–25)
CO2: 30 mmol/L (ref 20–32)
Calcium: 9.8 mg/dL (ref 8.6–10.3)
Chloride: 103 mmol/L (ref 98–110)
Creat: 0.96 mg/dL (ref 0.70–1.25)
GFR, Est African American: 93 mL/min/1.73m2
GFR, Est Non African American: 80 mL/min/1.73m2
Globulin: 3.2 g/dL (ref 1.9–3.7)
Glucose, Bld: 110 mg/dL — ABNORMAL HIGH (ref 65–99)
Potassium: 5.1 mmol/L (ref 3.5–5.3)
Sodium: 140 mmol/L (ref 135–146)
Total Bilirubin: 0.6 mg/dL (ref 0.2–1.2)
Total Protein: 7.7 g/dL (ref 6.1–8.1)

## 2019-09-30 LAB — CBC WITH DIFFERENTIAL/PLATELET
Absolute Monocytes: 623 cells/uL (ref 200–950)
Basophils Absolute: 38 cells/uL (ref 0–200)
Basophils Relative: 0.5 %
Eosinophils Absolute: 188 cells/uL (ref 15–500)
Eosinophils Relative: 2.5 %
HCT: 46.2 % (ref 38.5–50.0)
Hemoglobin: 15.3 g/dL (ref 13.2–17.1)
Lymphs Abs: 2490 cells/uL (ref 850–3900)
MCH: 31.9 pg (ref 27.0–33.0)
MCHC: 33.1 g/dL (ref 32.0–36.0)
MCV: 96.3 fL (ref 80.0–100.0)
MPV: 10.9 fL (ref 7.5–12.5)
Monocytes Relative: 8.3 %
Neutro Abs: 4163 cells/uL (ref 1500–7800)
Neutrophils Relative %: 55.5 %
Platelets: 258 10*3/uL (ref 140–400)
RBC: 4.8 10*6/uL (ref 4.20–5.80)
RDW: 14.4 % (ref 11.0–15.0)
Total Lymphocyte: 33.2 %
WBC: 7.5 10*3/uL (ref 3.8–10.8)

## 2019-09-30 NOTE — Progress Notes (Signed)
Glucose is 110.  Rest of CMP WNL.  CBC WNL.

## 2019-10-04 ENCOUNTER — Other Ambulatory Visit: Payer: Self-pay | Admitting: Physician Assistant

## 2019-10-04 ENCOUNTER — Other Ambulatory Visit: Payer: Self-pay | Admitting: Rheumatology

## 2019-10-04 DIAGNOSIS — K219 Gastro-esophageal reflux disease without esophagitis: Secondary | ICD-10-CM

## 2019-10-04 NOTE — Telephone Encounter (Addendum)
Too soon to refill. 90 day supply sent 09/14/19.

## 2019-10-05 DIAGNOSIS — Z23 Encounter for immunization: Secondary | ICD-10-CM | POA: Diagnosis not present

## 2019-10-25 NOTE — Progress Notes (Signed)
Pharmacy Note  Subjective:   Patient presents to clinic today to receive monthly dose of Cimzia.  Patient running a fever or have signs/symptoms of infection? No  Patient currently on antibiotics for the treatment of infection? No  Patient have any upcoming invasive procedures/surgeries? No  Objective: CMP     Component Value Date/Time   NA 140 09/29/2019 0925   NA 141 11/19/2016 0718   K 5.1 09/29/2019 0925   CL 103 09/29/2019 0925   CO2 30 09/29/2019 0925   GLUCOSE 110 (H) 09/29/2019 0925   BUN 15 09/29/2019 0925   BUN 14 11/19/2016 0718   CREATININE 0.96 09/29/2019 0925   CALCIUM 9.8 09/29/2019 0925   PROT 7.7 09/29/2019 0925   PROT 7.1 11/19/2016 0718   ALBUMIN 4.4 11/19/2016 0718   AST 33 09/29/2019 0925   ALT 34 09/29/2019 0925   ALKPHOS 65 11/19/2016 0718   BILITOT 0.6 09/29/2019 0925   BILITOT 0.5 11/19/2016 0718   GFRNONAA 80 09/29/2019 0925   GFRAA 93 09/29/2019 0925    CBC    Component Value Date/Time   WBC 7.5 09/29/2019 0925   RBC 4.80 09/29/2019 0925   HGB 15.3 09/29/2019 0925   HGB 14.9 11/19/2016 0718   HCT 46.2 09/29/2019 0925   HCT 42.7 11/19/2016 0718   PLT 258 09/29/2019 0925   PLT 255 11/19/2016 0718   MCV 96.3 09/29/2019 0925   MCV 93 11/19/2016 0718   MCH 31.9 09/29/2019 0925   MCHC 33.1 09/29/2019 0925   RDW 14.4 09/29/2019 0925   RDW 15.3 11/19/2016 0718   LYMPHSABS 2,490 09/29/2019 0925   LYMPHSABS 2.7 11/19/2016 0718   MONOABS 640 05/09/2016 0709   EOSABS 188 09/29/2019 0925   EOSABS 0.2 11/19/2016 0718   BASOSABS 38 09/29/2019 0925   BASOSABS 0.0 11/19/2016 0718    Baseline Immunosuppressant Therapy Labs TB GOLD History of positive TB gold.  Last chest x-ray normal on 09/18/2018.  Due for repeat chest x-ray.  Hepatitis Panel Negative 12/13/2009  Chest x-ray: 09/18/2018 No active cardiopulmonary disease.  No change since the prior study.  Assessment/Plan:   Administrations This Visit    Certolizumab Pegol KIT 400 mg    Admin Date 10/27/2019 Action Given Dose 400 mg Route Subcutaneous Administered By [employer] C, RPH-CPP          Administered in lower abdomen and patient tolerated well.    Patient due for repeat CBC/CMP in June.  Patient is due for repeat chest x-ray.  Orders are in place and patient states he is going to obtain today. Scheduled next appointment for Cimzia injection on 11/24/19 at [facility].   All questions encouraged and answered.  Instructed patient to call with any further questions or concerns.   Mariella Saa, PharmD, [place], [place] Clinical Specialty Pharmacist [phone]  10/27/2019 9:26 AM

## 2019-10-27 ENCOUNTER — Other Ambulatory Visit: Payer: Self-pay

## 2019-10-27 ENCOUNTER — Ambulatory Visit (INDEPENDENT_AMBULATORY_CARE_PROVIDER_SITE_OTHER): Payer: Medicare Other | Admitting: Pharmacist

## 2019-10-27 ENCOUNTER — Ambulatory Visit (HOSPITAL_COMMUNITY)
Admission: RE | Admit: 2019-10-27 | Discharge: 2019-10-27 | Disposition: A | Discharge status: Home / Self Care | Payer: Medicare Other | Source: Ambulatory Visit | Attending: Physician Assistant | Admitting: Physician Assistant

## 2019-10-27 VITALS — BP 131/66 | HR 59

## 2019-10-27 DIAGNOSIS — R7611 Nonspecific reaction to tuberculin skin test without active tuberculosis: Secondary | ICD-10-CM | POA: Insufficient documentation

## 2019-10-27 DIAGNOSIS — M1380 Other specified arthritis, unspecified site: Secondary | ICD-10-CM

## 2019-10-27 DIAGNOSIS — Z79899 Other long term (current) drug therapy: Secondary | ICD-10-CM | POA: Diagnosis not present

## 2019-10-27 MED ORDER — CERTOLIZUMAB PEGOL 2 X 200 MG ~~LOC~~ KIT
400.0000 mg | PACK | Freq: Once | SUBCUTANEOUS | Status: AC
  Administered 2019-10-27: 400 mg via SUBCUTANEOUS

## 2019-10-27 NOTE — Progress Notes (Signed)
CXR did not reveal an active cardiopulmonary process.

## 2019-10-28 DIAGNOSIS — R7303 Prediabetes: Secondary | ICD-10-CM | POA: Diagnosis not present

## 2019-10-28 DIAGNOSIS — E1122 Type 2 diabetes mellitus with diabetic chronic kidney disease: Secondary | ICD-10-CM | POA: Diagnosis not present

## 2019-10-28 DIAGNOSIS — E785 Hyperlipidemia, unspecified: Secondary | ICD-10-CM | POA: Diagnosis not present

## 2019-11-19 DIAGNOSIS — M109 Gout, unspecified: Secondary | ICD-10-CM | POA: Diagnosis not present

## 2019-11-19 DIAGNOSIS — K219 Gastro-esophageal reflux disease without esophagitis: Secondary | ICD-10-CM | POA: Diagnosis not present

## 2019-11-19 DIAGNOSIS — F3340 Major depressive disorder, recurrent, in remission, unspecified: Secondary | ICD-10-CM | POA: Diagnosis not present

## 2019-11-19 DIAGNOSIS — E785 Hyperlipidemia, unspecified: Secondary | ICD-10-CM | POA: Diagnosis not present

## 2019-11-19 DIAGNOSIS — R7303 Prediabetes: Secondary | ICD-10-CM | POA: Diagnosis not present

## 2019-11-19 DIAGNOSIS — I10 Essential (primary) hypertension: Secondary | ICD-10-CM | POA: Diagnosis not present

## 2019-11-19 DIAGNOSIS — K5190 Ulcerative colitis, unspecified, without complications: Secondary | ICD-10-CM | POA: Diagnosis not present

## 2019-11-24 ENCOUNTER — Other Ambulatory Visit: Payer: Self-pay

## 2019-11-24 ENCOUNTER — Ambulatory Visit (INDEPENDENT_AMBULATORY_CARE_PROVIDER_SITE_OTHER): Payer: Medicare Other | Admitting: Pharmacist

## 2019-11-24 VITALS — BP 138/70 | HR 54

## 2019-11-24 DIAGNOSIS — M1380 Other specified arthritis, unspecified site: Secondary | ICD-10-CM

## 2019-11-24 MED ORDER — CERTOLIZUMAB PEGOL 2 X 200 MG ~~LOC~~ KIT
400.0000 mg | PACK | Freq: Once | SUBCUTANEOUS | Status: AC
  Administered 2019-11-24: 400 mg via SUBCUTANEOUS

## 2019-11-24 NOTE — Progress Notes (Addendum)
Pharmacy Note  Subjective:   Patient presents to clinic today to receive monthly injection of Cimzia.  Patient running a fever or have signs/symptoms of infection? No  Patient currently on antibiotics for the treatment of infection? No  Patient have any upcoming invasive procedures/surgeries? No  Objective: CMP     Component Value Date/Time   NA 140 09/29/2019 0925   NA 141 11/19/2016 0718   K 5.1 09/29/2019 0925   CL 103 09/29/2019 0925   CO2 30 09/29/2019 0925   GLUCOSE 110 (H) 09/29/2019 0925   BUN 15 09/29/2019 0925   BUN 14 11/19/2016 0718   CREATININE 0.96 09/29/2019 0925   CALCIUM 9.8 09/29/2019 0925   PROT 7.7 09/29/2019 0925   PROT 7.1 11/19/2016 0718   ALBUMIN 4.4 11/19/2016 0718   AST 33 09/29/2019 0925   ALT 34 09/29/2019 0925   ALKPHOS 65 11/19/2016 0718   BILITOT 0.6 09/29/2019 0925   BILITOT 0.5 11/19/2016 0718   GFRNONAA 80 09/29/2019 0925   GFRAA 93 09/29/2019 0925    CBC    Component Value Date/Time   WBC 7.5 09/29/2019 0925   RBC 4.80 09/29/2019 0925   HGB 15.3 09/29/2019 0925   HGB 14.9 11/19/2016 0718   HCT 46.2 09/29/2019 0925   HCT 42.7 11/19/2016 0718   PLT 258 09/29/2019 0925   PLT 255 11/19/2016 0718   MCV 96.3 09/29/2019 0925   MCV 93 11/19/2016 0718   MCH 31.9 09/29/2019 0925   MCHC 33.1 09/29/2019 0925   RDW 14.4 09/29/2019 0925   RDW 15.3 11/19/2016 0718   LYMPHSABS 2,490 09/29/2019 0925   LYMPHSABS 2.7 11/19/2016 0718   MONOABS 640 05/09/2016 0709   EOSABS 188 09/29/2019 0925   EOSABS 0.2 11/19/2016 0718   BASOSABS 38 09/29/2019 0925   BASOSABS 0.0 11/19/2016 0718    Baseline Immunosuppressant Therapy Labs TB GOLD Patient has history of positive TB and has yearly chest x-ray  Hepatitis Panel Negative 12/13/2009  Chest x-ray: stable chest. No active cardiopulmonary process.10/27/2019.  Assessment/Plan:   Administrations This Visit    Certolizumab Pegol KIT 400 mg    Admin Date 11/24/2019 Action Given Dose 400 mg  Route Subcutaneous Administered By Mariella Saa C, RPH-CPP         Patient tolerated well without adverse reaction.  Patient scheduled for next Cimzia injection on 6/9 at 9AM.  He will need labs with his next injection in June.  All questions encouraged and answered.  Instructed patient to call with any further questions or concerns.   Mariella Saa, PharmD, [place], [place] Clinical Specialty Pharmacist [phone]  11/24/2019 9:52 AM

## 2019-12-15 NOTE — Progress Notes (Signed)
Pharmacy Note  Subjective:   Patient presents to clinic today to receive monthly dose of Cimzia.  Patient running a fever or have signs/symptoms of infection? No  Patient currently on antibiotics for the treatment of infection? No  Patient have any upcoming invasive procedures/surgeries? No  Objective: CMP     Component Value Date/Time   NA 140 09/29/2019 0925   NA 141 11/19/2016 0718   K 5.1 09/29/2019 0925   CL 103 09/29/2019 0925   CO2 30 09/29/2019 0925   GLUCOSE 110 (H) 09/29/2019 0925   BUN 15 09/29/2019 0925   BUN 14 11/19/2016 0718   CREATININE 0.96 09/29/2019 0925   CALCIUM 9.8 09/29/2019 0925   PROT 7.7 09/29/2019 0925   PROT 7.1 11/19/2016 0718   ALBUMIN 4.4 11/19/2016 0718   AST 33 09/29/2019 0925   ALT 34 09/29/2019 0925   ALKPHOS 65 11/19/2016 0718   BILITOT 0.6 09/29/2019 0925   BILITOT 0.5 11/19/2016 0718   GFRNONAA 80 09/29/2019 0925   GFRAA 93 09/29/2019 0925    CBC    Component Value Date/Time   WBC 7.5 09/29/2019 0925   RBC 4.80 09/29/2019 0925   HGB 15.3 09/29/2019 0925   HGB 14.9 11/19/2016 0718   HCT 46.2 09/29/2019 0925   HCT 42.7 11/19/2016 0718   PLT 258 09/29/2019 0925   PLT 255 11/19/2016 0718   MCV 96.3 09/29/2019 0925   MCV 93 11/19/2016 0718   MCH 31.9 09/29/2019 0925   MCHC 33.1 09/29/2019 0925   RDW 14.4 09/29/2019 0925   RDW 15.3 11/19/2016 0718   LYMPHSABS 2,490 09/29/2019 0925   LYMPHSABS 2.7 11/19/2016 0718   MONOABS 640 05/09/2016 0709   EOSABS 188 09/29/2019 0925   EOSABS 0.2 11/19/2016 0718   BASOSABS 38 09/29/2019 0925   BASOSABS 0.0 11/19/2016 0718    Baseline Immunosuppressant Therapy Labs TB GOLD Patient has history of positive TB and has yearly chest x-ray  Hepatitis Panel Negative 12/13/2009  Chest x-ray: stable chest. No active cardiopulmonary process.10/27/2019.  Assessment/Plan:    Administrations This Visit    Certolizumab Pegol KIT 400 mg    Admin Date 12/22/2019 Action Given Dose 400 mg  Route Subcutaneous Administered By Deboraha Sprang, RPH-CPP         Patient tolerated injection without issue.   Appointment for next injection scheduled for 01/24/20 at [facility].  Patient due for labs today.  Patient is to call and reschedule appointment if running a fever with signs/symptoms of infection, on antibiotics for active infection or has an upcoming invasive procedure.  All questions encouraged and answered.  Instructed patient to call with any further questions or concerns.  Mariella Saa, PharmD, [place], CPP Clinical Specialty Pharmacist (Rheumatology and Pulmonology)  12/22/2019 9:33 AM

## 2019-12-20 ENCOUNTER — Other Ambulatory Visit: Payer: Self-pay | Admitting: Rheumatology

## 2019-12-20 NOTE — Telephone Encounter (Signed)
Last Visit: 09/23/2019 Next Visit: 12/22/2019 Labs: 09/29/2019 Glucose is 110. Rest of CMP WNL. CBC WNL.  Current Dose per office note 09/23/2019: mitigare 0.6 mg po daily. folic acid 1 mg 1 tablet daily  Okay to refill per Dr. Estanislado Pandy

## 2019-12-22 ENCOUNTER — Ambulatory Visit (INDEPENDENT_AMBULATORY_CARE_PROVIDER_SITE_OTHER): Payer: Medicare Other | Admitting: Pharmacist

## 2019-12-22 ENCOUNTER — Other Ambulatory Visit: Payer: Self-pay

## 2019-12-22 VITALS — BP 141/65 | HR 60

## 2019-12-22 DIAGNOSIS — Z79899 Other long term (current) drug therapy: Secondary | ICD-10-CM

## 2019-12-22 DIAGNOSIS — M1990 Unspecified osteoarthritis, unspecified site: Secondary | ICD-10-CM

## 2019-12-22 MED ORDER — CERTOLIZUMAB PEGOL 2 X 200 MG ~~LOC~~ KIT
400.0000 mg | PACK | Freq: Once | SUBCUTANEOUS | Status: AC
  Administered 2019-12-22: 400 mg via SUBCUTANEOUS

## 2019-12-24 LAB — HEPATITIS C RNA QUANTITATIVE
HCV Quantitative Log: <1.18 Log IU/mL
HCV RNA, PCR, QN: <15 IU/mL

## 2019-12-24 LAB — IGG, IGA, IGM
IgG (Immunoglobin G), Serum: 1276 mg/dL (ref 600–1540)
IgM, Serum: 148 mg/dL (ref 50–300)
Immunoglobulin A: 392 mg/dL — ABNORMAL HIGH (ref 70–320)

## 2019-12-24 LAB — PROTEIN ELECTROPHORESIS, SERUM, WITH REFLEX
Albumin ELP: 3.9 g/dL (ref 3.8–4.8)
Alpha 1: 0.3 g/dL (ref 0.2–0.3)
Alpha 2: 0.8 g/dL (ref 0.5–0.9)
Beta 2: 0.4 g/dL (ref 0.2–0.5)
Beta Globulin: 0.5 g/dL (ref 0.4–0.6)
Gamma Globulin: 1.2 g/dL (ref 0.8–1.7)
Total Protein: 7.1 g/dL (ref 6.1–8.1)

## 2019-12-24 LAB — HEPATITIS B CORE ANTIBODY, IGM: Hep B C IgM: NONREACTIVE

## 2019-12-24 LAB — HEPATITIS B SURFACE ANTIGEN: Hepatitis B Surface Ag: NONREACTIVE

## 2019-12-24 LAB — HEPATITIS C ANTIBODY
Hepatitis C Ab: NONREACTIVE
SIGNAL TO CUT-OFF: 0.01 (ref <1.00)

## 2019-12-24 NOTE — Progress Notes (Signed)
All labs within normal limits.  He can continue with Cimzia.

## 2019-12-27 ENCOUNTER — Other Ambulatory Visit: Payer: Self-pay | Admitting: Physician Assistant

## 2019-12-27 DIAGNOSIS — K219 Gastro-esophageal reflux disease without esophagitis: Secondary | ICD-10-CM

## 2019-12-27 NOTE — Telephone Encounter (Signed)
Patient need office visit.

## 2020-01-20 ENCOUNTER — Other Ambulatory Visit: Payer: Self-pay | Admitting: Rheumatology

## 2020-01-20 NOTE — Telephone Encounter (Signed)
Last Visit: 09/23/2019 Next Visit: 12/22/2019 Labs: 09/29/2019 Glucose is 110. Rest of CMP WNL. CBC WNL.  Current Dose per office note 09/23/2019: allopurinol 300 mg po daily   Okay to refill per Dr. Estanislado Pandy

## 2020-01-21 NOTE — Progress Notes (Signed)
Pharmacy Note  Subjective:   Patient presents to clinic today to receive monthly dose of Cimzia.  Patient running a fever or have signs/symptoms of infection? No  Patient currently on antibiotics for the treatment of infection? No  Patient have any upcoming invasive procedures/surgeries? No  Objective: CMP     Component Value Date/Time   NA 140 09/29/2019 0925   NA 141 11/19/2016 0718   K 5.1 09/29/2019 0925   CL 103 09/29/2019 0925   CO2 30 09/29/2019 0925   GLUCOSE 110 (H) 09/29/2019 0925   BUN 15 09/29/2019 0925   BUN 14 11/19/2016 0718   CREATININE 0.96 09/29/2019 0925   CALCIUM 9.8 09/29/2019 0925   PROT 7.1 12/22/2019 0858   PROT 7.1 11/19/2016 0718   ALBUMIN 4.4 11/19/2016 0718   AST 33 09/29/2019 0925   ALT 34 09/29/2019 0925   ALKPHOS 65 11/19/2016 0718   BILITOT 0.6 09/29/2019 0925   BILITOT 0.5 11/19/2016 0718   GFRNONAA 80 09/29/2019 0925   GFRAA 93 09/29/2019 0925    CBC    Component Value Date/Time   WBC 7.5 09/29/2019 0925   RBC 4.80 09/29/2019 0925   HGB 15.3 09/29/2019 0925   HGB 14.9 11/19/2016 0718   HCT 46.2 09/29/2019 0925   HCT 42.7 11/19/2016 0718   PLT 258 09/29/2019 0925   PLT 255 11/19/2016 0718   MCV 96.3 09/29/2019 0925   MCV 93 11/19/2016 0718   MCH 31.9 09/29/2019 0925   MCHC 33.1 09/29/2019 0925   RDW 14.4 09/29/2019 0925   RDW 15.3 11/19/2016 0718   LYMPHSABS 2,490 09/29/2019 0925   LYMPHSABS 2.7 11/19/2016 0718   MONOABS 640 05/09/2016 0709   EOSABS 188 09/29/2019 0925   EOSABS 0.2 11/19/2016 0718   BASOSABS 38 09/29/2019 0925   BASOSABS 0.0 11/19/2016 0718    Baseline Immunosuppressant Therapy Labs TB GOLD Patient has history of positive TB and monitor chest x-ray yearly.  Hepatitis Panel Hepatitis Latest Ref Rng & Units 12/22/2019  Hep B Surface Ag NON-REACTI NON-REACTIVE  Hep B IgM NON-REACTI NON-REACTIVE  Hep C Ab NON-REACTI NON-REACTIVE  Hep C Ab NON-REACTI NON-REACTIVE   HIV No results found for: HIV   Medicare will only pay for one test per lifetime.  Negative per patient.  Immunoglobulins Immunoglobulin Electrophoresis Latest Ref Rng & Units 12/22/2019  IgA  70 - 320 mg/dL 392(H)  IgG 600 - 1,540 mg/dL 1,276  IgM 50 - 300 mg/dL 148   SPEP Serum Protein Electrophoresis Latest Ref Rng & Units 12/22/2019  Total Protein 6.1 - 8.1 g/dL 7.1  Albumin 3.8 - 4.8 g/dL 3.9  Alpha-1 0.2 - 0.3 g/dL 0.3  Alpha-2 0.5 - 0.9 g/dL 0.8  Beta Globulin 0.4 - 0.6 g/dL 0.5  Beta 2 0.2 - 0.5 g/dL 0.4  Gamma Globulin 0.8 - 1.7 g/dL 1.2   Chest x-ray:stable chest. No active cardiopulmonary process.10/27/2019.  Assessment/Plan:   Administrations This Visit    Certolizumab Pegol KIT 400 mg    Admin Date 01/24/2020 Action Given Dose 400 mg Route Subcutaneous Administered By Mariella Saa C, RPH-CPP         Patient tolerated injection without issue.   Appointment for next injection scheduled for 8/10 @ 9AM.  Patient was to have CBC/CMP at last visit but was no drawn.  CBC/CMP drawn today and will be due for labs in October and chest x-ray April 2022.  Patient is to call and reschedule appointment if running a fever with signs/symptoms  of infection, on antibiotics for active infection or has an upcoming invasive procedure.  All questions encouraged and answered.  Instructed patient to call with any further questions or concerns.   Mariella Saa, PharmD, [place], CPP Clinical Specialty Pharmacist (Rheumatology and Pulmonology)  01/24/2020 9:48 AM

## 2020-01-24 ENCOUNTER — Other Ambulatory Visit: Payer: Self-pay

## 2020-01-24 ENCOUNTER — Ambulatory Visit (INDEPENDENT_AMBULATORY_CARE_PROVIDER_SITE_OTHER): Payer: Medicare Other | Admitting: Pharmacist

## 2020-01-24 VITALS — BP 135/67 | HR 56

## 2020-01-24 DIAGNOSIS — Z79899 Other long term (current) drug therapy: Secondary | ICD-10-CM | POA: Diagnosis not present

## 2020-01-24 DIAGNOSIS — M1380 Other specified arthritis, unspecified site: Secondary | ICD-10-CM

## 2020-01-24 MED ORDER — CERTOLIZUMAB PEGOL 2 X 200 MG ~~LOC~~ KIT
400.0000 mg | PACK | Freq: Once | SUBCUTANEOUS | Status: AC
  Administered 2020-01-24: 400 mg via SUBCUTANEOUS

## 2020-01-24 NOTE — Progress Notes (Signed)
Medication Samples have been provided to the patient.  Drug name: mitigare    Strength: 0.27m     Qty: 3 LOT: [id number].Date: 02/2020  Dosing instructions: Take 1 capsule by mouth daily.   The patient has been instructed regarding the correct time, dose, and frequency of taking this medication, including desired effects and most common side effects.   MEarnestine Mealing9:03 AM 01/24/2020

## 2020-01-25 ENCOUNTER — Other Ambulatory Visit: Payer: Self-pay | Admitting: Physician Assistant

## 2020-01-25 DIAGNOSIS — K219 Gastro-esophageal reflux disease without esophagitis: Secondary | ICD-10-CM

## 2020-01-25 LAB — COMPLETE METABOLIC PANEL WITH GFR
AG Ratio: 1.5 (calc) (ref 1.0–2.5)
ALT: 41 U/L (ref 9–46)
AST: 30 U/L (ref 10–35)
Albumin: 4.3 g/dL (ref 3.6–5.1)
Alkaline phosphatase (APISO): 49 U/L (ref 35–144)
BUN: 17 mg/dL (ref 7–25)
CO2: 27 mmol/L (ref 20–32)
Calcium: 9.3 mg/dL (ref 8.6–10.3)
Chloride: 106 mmol/L (ref 98–110)
Creat: 0.89 mg/dL (ref 0.70–1.25)
GFR, Est African American: 101 mL/min/{1.73_m2} (ref >=60)
GFR, Est Non African American: 87 mL/min/{1.73_m2} (ref >=60)
Globulin: 2.9 g/dL (calc) (ref 1.9–3.7)
Glucose, Bld: 115 mg/dL — ABNORMAL HIGH (ref 65–99)
Potassium: 4.8 mmol/L (ref 3.5–5.3)
Sodium: 138 mmol/L (ref 135–146)
Total Bilirubin: 0.7 mg/dL (ref 0.2–1.2)
Total Protein: 7.2 g/dL (ref 6.1–8.1)

## 2020-01-25 LAB — CBC WITH DIFFERENTIAL/PLATELET
Absolute Monocytes: 578 cells/uL (ref 200–950)
Basophils Absolute: 27 cells/uL (ref 0–200)
Basophils Relative: 0.4 %
Eosinophils Absolute: 170 cells/uL (ref 15–500)
Eosinophils Relative: 2.5 %
HCT: 45 % (ref 38.5–50.0)
Hemoglobin: 15 g/dL (ref 13.2–17.1)
Lymphs Abs: 2190 cells/uL (ref 850–3900)
MCH: 32.4 pg (ref 27.0–33.0)
MCHC: 33.3 g/dL (ref 32.0–36.0)
MCV: 97.2 fL (ref 80.0–100.0)
MPV: 11.3 fL (ref 7.5–12.5)
Monocytes Relative: 8.5 %
Neutro Abs: 3835 cells/uL (ref 1500–7800)
Neutrophils Relative %: 56.4 %
Platelets: 222 10*3/uL (ref 140–400)
RBC: 4.63 10*6/uL (ref 4.20–5.80)
RDW: 14.3 % (ref 11.0–15.0)
Total Lymphocyte: 32.2 %
WBC: 6.8 10*3/uL (ref 3.8–10.8)

## 2020-01-25 NOTE — Progress Notes (Signed)
CBC/CMP within normal limits.  He should continue current regimen of Cimzia, methotrexate, and folic acid.

## 2020-02-14 DIAGNOSIS — E785 Hyperlipidemia, unspecified: Secondary | ICD-10-CM | POA: Diagnosis not present

## 2020-02-14 DIAGNOSIS — E1122 Type 2 diabetes mellitus with diabetic chronic kidney disease: Secondary | ICD-10-CM | POA: Diagnosis not present

## 2020-02-14 DIAGNOSIS — I10 Essential (primary) hypertension: Secondary | ICD-10-CM | POA: Diagnosis not present

## 2020-02-14 DIAGNOSIS — Z125 Encounter for screening for malignant neoplasm of prostate: Secondary | ICD-10-CM | POA: Diagnosis not present

## 2020-02-14 DIAGNOSIS — R739 Hyperglycemia, unspecified: Secondary | ICD-10-CM | POA: Diagnosis not present

## 2020-02-21 DIAGNOSIS — N182 Chronic kidney disease, stage 2 (mild): Secondary | ICD-10-CM | POA: Diagnosis not present

## 2020-02-21 DIAGNOSIS — M109 Gout, unspecified: Secondary | ICD-10-CM | POA: Diagnosis not present

## 2020-02-21 DIAGNOSIS — K219 Gastro-esophageal reflux disease without esophagitis: Secondary | ICD-10-CM | POA: Diagnosis not present

## 2020-02-21 DIAGNOSIS — E785 Hyperlipidemia, unspecified: Secondary | ICD-10-CM | POA: Diagnosis not present

## 2020-02-21 DIAGNOSIS — I10 Essential (primary) hypertension: Secondary | ICD-10-CM | POA: Diagnosis not present

## 2020-02-21 DIAGNOSIS — E1122 Type 2 diabetes mellitus with diabetic chronic kidney disease: Secondary | ICD-10-CM | POA: Diagnosis not present

## 2020-02-21 DIAGNOSIS — M6208 Separation of muscle (nontraumatic), other site: Secondary | ICD-10-CM | POA: Diagnosis not present

## 2020-02-21 DIAGNOSIS — K5190 Ulcerative colitis, unspecified, without complications: Secondary | ICD-10-CM | POA: Diagnosis not present

## 2020-02-21 DIAGNOSIS — Z0000 Encounter for general adult medical examination without abnormal findings: Secondary | ICD-10-CM | POA: Diagnosis not present

## 2020-02-21 DIAGNOSIS — F3340 Major depressive disorder, recurrent, in remission, unspecified: Secondary | ICD-10-CM | POA: Diagnosis not present

## 2020-02-21 DIAGNOSIS — R7303 Prediabetes: Secondary | ICD-10-CM | POA: Diagnosis not present

## 2020-02-22 ENCOUNTER — Other Ambulatory Visit: Payer: Self-pay

## 2020-02-22 ENCOUNTER — Ambulatory Visit (INDEPENDENT_AMBULATORY_CARE_PROVIDER_SITE_OTHER): Payer: Medicare Other | Admitting: Pharmacist

## 2020-02-22 VITALS — BP 134/76 | HR 62

## 2020-02-22 DIAGNOSIS — M1990 Unspecified osteoarthritis, unspecified site: Secondary | ICD-10-CM | POA: Diagnosis not present

## 2020-02-22 MED ORDER — CERTOLIZUMAB PEGOL 2 X 200 MG ~~LOC~~ KIT
400.0000 mg | PACK | Freq: Once | SUBCUTANEOUS | Status: AC
  Administered 2020-02-22: 400 mg via SUBCUTANEOUS

## 2020-02-22 NOTE — Progress Notes (Signed)
Pharmacy Note  Subjective:   Patient presents to clinic today to receive monthly dose of Cimzia.  Patient running a fever or have signs/symptoms of infection? No  Patient currently on antibiotics for the treatment of infection? No  Patient have any upcoming invasive procedures/surgeries? No  Objective: CMP     Component Value Date/Time   NA 138 01/24/2020 0917   NA 141 11/19/2016 0718   K 4.8 01/24/2020 0917   CL 106 01/24/2020 0917   CO2 27 01/24/2020 0917   GLUCOSE 115 (H) 01/24/2020 0917   BUN 17 01/24/2020 0917   BUN 14 11/19/2016 0718   CREATININE 0.89 01/24/2020 0917   CALCIUM 9.3 01/24/2020 0917   PROT 7.2 01/24/2020 0917   PROT 7.1 11/19/2016 0718   ALBUMIN 4.4 11/19/2016 0718   AST 30 01/24/2020 0917   ALT 41 01/24/2020 0917   ALKPHOS 65 11/19/2016 0718   BILITOT 0.7 01/24/2020 0917   BILITOT 0.5 11/19/2016 0718   GFRNONAA 87 01/24/2020 0917   GFRAA 101 01/24/2020 0917    CBC    Component Value Date/Time   WBC 6.8 01/24/2020 0917   RBC 4.63 01/24/2020 0917   HGB 15.0 01/24/2020 0917   HGB 14.9 11/19/2016 0718   HCT 45.0 01/24/2020 0917   HCT 42.7 11/19/2016 0718   PLT 222 01/24/2020 0917   PLT 255 11/19/2016 0718   MCV 97.2 01/24/2020 0917   MCV 93 11/19/2016 0718   MCH 32.4 01/24/2020 0917   MCHC 33.3 01/24/2020 0917   RDW 14.3 01/24/2020 0917   RDW 15.3 11/19/2016 0718   LYMPHSABS 2,190 01/24/2020 0917   LYMPHSABS 2.7 11/19/2016 0718   MONOABS 640 05/09/2016 0709   EOSABS 170 01/24/2020 0917   EOSABS 0.2 11/19/2016 0718   BASOSABS 27 01/24/2020 0917   BASOSABS 0.0 11/19/2016 0718    Baseline Immunosuppressant Therapy Labs TB GOLD History of positive TB and treated.  Last chest x-ray normal April 2021.  Hepatitis Panel Hepatitis Latest Ref Rng & Units 12/22/2019  Hep B Surface Ag NON-REACTI NON-REACTIVE  Hep B IgM NON-REACTI NON-REACTIVE  Hep C Ab NON-REACTI NON-REACTIVE  Hep C Ab NON-REACTI NON-REACTIVE   HIV No results found for:  HIV Immunoglobulin Immunoglobulin Electrophoresis Latest Ref Rng & Units 12/22/2019  IgA  70 - 320 mg/dL 392(H)  IgG 600 - 1,540 mg/dL 1,276  IgM 50 - 300 mg/dL 148   SPEP Serum Protein Electrophoresis Latest Ref Rng & Units 01/24/2020  Total Protein 6.1 - 8.1 g/dL 7.2  Albumin 3.8 - 4.8 g/dL -  Alpha-1 0.2 - 0.3 g/dL -  Alpha-2 0.5 - 0.9 g/dL -  Beta Globulin 0.4 - 0.6 g/dL -  Beta 2 0.2 - 0.5 g/dL -  Gamma Globulin 0.8 - 1.7 g/dL -   G6PD No results found for: G6PDH TPMT No results found for: TPMT   Chest x-ray: stable chest. No active cardiopulmonary process.10/27/19  Assessment/Plan:   Administrations This Visit    Certolizumab Pegol KIT 400 mg    Admin Date 02/22/2020 Action Given Dose 400 mg Route Subcutaneous Administered By Deboraha Sprang, RPH-CPP          Patient tolerated injection without issue.   Appointment for next injection scheduled for 03/21/20 at [facility].  Patient due for labs in October and chest x-ray in April 2022.  Patient is to call and reschedule appointment if running a fever with signs/symptoms of infection, on antibiotics for active infection or has an upcoming invasive procedure.  All questions encouraged and answered.  Instructed patient to call with any further questions or concerns.  Mariella Saa, PharmD, [place], CPP Clinical Specialty Pharmacist (Rheumatology and Pulmonology)  02/22/2020 9:41 AM

## 2020-02-29 ENCOUNTER — Other Ambulatory Visit: Payer: Self-pay | Admitting: Rheumatology

## 2020-02-29 ENCOUNTER — Other Ambulatory Visit: Payer: Self-pay | Admitting: Internal Medicine

## 2020-02-29 DIAGNOSIS — K219 Gastro-esophageal reflux disease without esophagitis: Secondary | ICD-10-CM

## 2020-02-29 NOTE — Progress Notes (Signed)
Office Visit Note  Patient: Christopher Smith             Date of Birth: 1950-12-11           MRN: [id number]             PCP: Thressa Sheller, MD (Inactive) Referring: No ref. provider found Visit Date: 03/13/2020 Occupation: @GUAROCC @  Subjective:  Medication monitoring   History of Present Illness: Christopher Smith is a 69 y.o. male with history of seronegative rheumatoid arthritis, gout, and ulcerative colitis.  Patient is on Cimzia 400 mg subcutaneous injections every 28 days, methotrexate 0.6 mL of days injections once weekly, and folic acid 2 mg by mouth daily.  He denies any recent rheumatoid arthritis flares.  He states that he continues to experience intermittent pain and stiffness in his right hand.  He denies any joint swelling.  He states that his discomfort is typically better during the summer months.  He denies any recent gout flares.  He denies any recent ulcerative colitis flares.  He states that he continues to have about 7 stools per day.    Activities of Daily Living:  Patient reports morning stiffness for 10-15  minutes.   Patient Reports nocturnal pain.  Difficulty dressing/grooming: Reports Difficulty climbing stairs: Denies Difficulty getting out of chair: Denies Difficulty using hands for taps, buttons, cutlery, and/or writing: Reports  Review of Systems  Constitutional: Negative for fatigue.  HENT: Positive for mouth dryness. Negative for mouth sores and nose dryness.   Eyes: Negative for itching and dryness.  Respiratory: Negative for shortness of breath and difficulty breathing.   Cardiovascular: Negative for chest pain and palpitations.  Gastrointestinal: Positive for diarrhea. Negative for blood in stool and constipation.  Endocrine: Negative for increased urination.  Genitourinary: Negative for difficulty urinating.  Musculoskeletal: Positive for arthralgias, joint pain and morning stiffness. Negative for joint swelling, myalgias, muscle tenderness and  myalgias.  Skin: Negative for color change, rash and redness.  Allergic/Immunologic: Negative for susceptible to infections.  Neurological: Positive for memory loss. Negative for dizziness, numbness, headaches and weakness.  Hematological: Negative for bruising/bleeding tendency.  Psychiatric/Behavioral: Negative for confusion.    PMFS History:  Patient Active Problem List   Diagnosis Date Noted  . Open fracture of one or more phalanges of hand 11/21/2017  . Chronic inflammatory arthritis 08/05/2016  . High risk medication use 08/05/2016  . Contracture of elbow, right 08/05/2016  . History of total knee replacement, bilateral 08/05/2016  . 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 History of bilateral rotator cuff tear repair 08/05/2016  . Gastroesophageal reflux  08/05/2016  . Latent tuberculosis by blood test 08/05/2016  . Ulcerative colitis 09/03/2011  . Glucose intolerance (impaired glucose tolerance) 09/03/2011  . Severe diarrhea 09/03/2011  . URI (upper respiratory infection) 07/02/2011  . Bile salt-induced diarrhea 07/02/2011  . S/P cholecystectomy 07/02/2011  . Ulcerative colitis, unspecified 04/03/2011  . Benign neoplasm of colon 04/03/2011  . Positive TB test 03/22/2011  . Insomnia due to medical condition 03/22/2011  . UC (ulcerative colitis) ([place]) 03/01/2011  . Leg cramps 03/01/2011  . Ulcerative colitis with rectal bleeding ([place]) 01/24/2011  . Nonspecific abnormal finding in stool contents 01/24/2011  . Heme positive stool 01/11/2011  . Diarrhea 01/11/2011  . Arthritis associated with inflammatory bowel disease 01/11/2011  . Intentional weight loss 01/11/2011    Past Medical History:  Diagnosis Date  . Anal fissure   . Arthritis    RA  .  Blood transfusion without reported diagnosis   . Diabetes mellitus    no meds taken 05-01-16- elevated glucose was from predisone for UC  . Diarrhea   . Fatty liver   . GERD (gastroesophageal reflux  disease)   . Gout   . Hiatal hernia   . Internal hemorrhoids   . Polyarthritis   . Sessile rectal polyp 03/27/2011  . Sinusitis   . Tuberculosis    positive PPD- had an allergic reaction to the PPD, gets yearly chest xray  . Tubular adenoma of colon   . Ulcerative colitis 01/24/2011    Family History  Problem Relation Age of Onset  . Alzheimer's disease Father   . Stroke Mother   . Cancer Mother        liver & breast   . Rheum arthritis Brother   . Rheum arthritis Brother   . Rheum arthritis Sister   . Rheum arthritis Sister   . Rheum arthritis Sister   . Colon cancer Neg Hx   . Esophageal cancer Neg Hx   . Stomach cancer Neg Hx   . Rectal cancer Neg Hx   . Colon polyps Neg Hx    Past Surgical History:  Procedure Laterality Date  . CHOLECYSTECTOMY    . COLONOSCOPY    . KNEE SURGERY     Bilateral   . POLYPECTOMY    . SHOULDER SURGERY Bilateral    Bilateral - rotator cuff repair   . TOTAL KNEE ARTHROPLASTY Bilateral    Social History   Social History Narrative   1 caffeine drink daily    Immunization History  Administered Date(s) Administered  . Influenza, High Dose Seasonal PF 04/28/2019  . Moderna SARS-COVID-2 Vaccination 09/08/2019, 10/06/2019  . Tdap 11/19/2017     Objective: Vital Signs: BP 128/78 (BP Location: Left Arm, Patient Position: Sitting, Cuff Size: Normal)   Pulse (!) 57   Resp 15   Ht 5' 11"  (1.803 m)   Wt 226 lb (102.5 kg)   BMI 31.52 kg/m    Physical Exam Vitals and nursing note reviewed.  Constitutional:      Appearance: He is well-developed.  HENT:     Head: Normocephalic and atraumatic.  Eyes:     Conjunctiva/sclera: Conjunctivae normal.     Pupils: Pupils are equal, round, and reactive to light.  Pulmonary:     Effort: Pulmonary effort is normal.  Abdominal:     Palpations: Abdomen is soft.  Musculoskeletal:     Cervical back: Normal range of motion and neck supple.  Skin:    General: Skin is warm and dry.     Capillary  Refill: Capillary refill takes less than 2 seconds.  Neurological:     Mental Status: He is alert and oriented to person, place, and time.  Psychiatric:        Behavior: Behavior normal.      Musculoskeletal Exam: C-spine limited ROM with lateral rotation.  Good flexion and extension of C-spine.  Thoracic and lumbar spine good ROM.  No midline spinal tenderness.  No SI joint tenderness.  Shoulder joints, elbow joints, wrist joints, MCPs, PIPs, DIPs have good range of motion with no synovitis.  He has PIP and DIP thickening consistent with osteoarthritis of both hands.  He has complete fist formation bilaterally.  CMC joint prominence noted.  Hip joints have good range of motion with no discomfort.  Bilateral knee replacements have good range of motion with warmth but no effusion.  Ankle joints have  good range of motion with no tenderness or inflammation.  CDAI Exam: CDAI Score: -- Patient Global: --; Provider Global: -- Swollen: --; Tender: -- Joint Exam 03/13/2020   No joint exam has been documented for this visit   There is currently no information documented on the homunculus. Go to the Rheumatology activity and complete the homunculus joint exam.  Investigation: No additional findings.  Imaging: No results found.  Recent Labs: Lab Results  Component Value Date   WBC 6.8 01/24/2020   HGB 15.0 01/24/2020   PLT 222 01/24/2020   NA 138 01/24/2020   K 4.8 01/24/2020   CL 106 01/24/2020   CO2 27 01/24/2020   GLUCOSE 115 (H) 01/24/2020   BUN 17 01/24/2020   CREATININE 0.89 01/24/2020   BILITOT 0.7 01/24/2020   ALKPHOS 65 11/19/2016   AST 30 01/24/2020   ALT 41 01/24/2020   PROT 7.2 01/24/2020   ALBUMIN 4.4 11/19/2016   CALCIUM 9.3 01/24/2020   GFRAA 101 01/24/2020    Speciality Comments: Prior therapy: Humira and Remicaide (inadequate response)  Procedures:  No procedures performed Allergies: Patient has no known allergies.   Assessment / Plan:     Visit  Diagnoses: Seronegative arthritis - inflammatory, erosive: He has no tenderness or synovitis on exam.  He has not had any recent rheumatoid arthritis flares.  He is clinically doing well on Cimzia 400 mg subcutaneous injections every 28 days, methotrexate 0.6 mL subcutaneous injections once weekly, and folic acid 1 mg by mouth daily.  He experiences intermittent pain and stiffness in both hands especially his right hand.  He has PIP and DIP thickening as well as CMC joint prominence bilaterally.  No inflammation was noted on exam.  He will continue on the current treatment regimen.  He was advised to notify us if he develops increased joint pain or joint swelling.  He will follow-up in the office in 5 months. - Plan: DG BONE DENSITY (DXA)  High risk medication use - Cimzia in office injection 400 mg every 28 days, methotrexate 0.6 mL every 7 days, and folic acid 1 mg 1 tablet daily.  CBC and CMP were drawn on 01/24/2020.  He will be due to update lab work in October and every 3 months to monitor for drug toxicity.  Standing orders for CBC and CMP are in place.  He has yearly chest x-rays due to his history of positive TB Gold.  Chest x-ray was stable on 10/27/2019. He has received both Covid vaccinations but is not planning on receiving the third dose.  We discussed that if he does change his mind and proceeds with a third dose he is to hold methotrexate 1 week after.  He was advised to continue to wear a mask and social distance.  He will notify us or his PCP if he develops a COVID-19 infection in order to receive the antibody infusion.  Idiopathic chronic gout of multiple sites without tophus -He has not had any recent gout flares.  He is clinically doing well on allopurinol 300 mg po daily and mitigare 0.6 mg po daily. uric acid: 07/02/2019 4.2.  Future order for uric acid will be placed today.  Other ulcerative colitis with other complication ([place]) -He has not had any recent ulcerative colitis flares.  He  has not had any blood in his stool recently.  He continues to have 7-8 stools per day.  He is due to update his colonoscopy and is planning on calling his GI specialist  to schedule. Plan: DG BONE DENSITY (DXA)  Primary osteoarthritis of both hands: He has PIP and DIP thickening consistent with osteoarthritis of both hands.  He experiences stiffness in both hands on a daily basis.  He has not had any joint inflammation recently.  Joint protection and muscle strengthening were discussed.  Contracture of right elbow: Unchanged.  He has no tenderness or inflammation at this time.   Trigger finger, right ring finger - Resolved  History of total bilateral knee replacement - Dr. Ronnie Derby: He has good ROM of both knee joints on exam. Warmth but no effusion noted.  He has no discomfort in his knee joints at this time.   History of bilateral rotator cuff tear repair: He has good ROM with no discomfort.   Osteoporosis screening -Patient requested to have a bone density performed.  He has family history of osteoporosis. He has underlying rheumatoid arthritis and ulcerative colitis which has required systemic steroids in the past.  Order for DEXA was placed today.  Plan: DG BONE DENSITY (DXA)  Long term systemic steroid user -He has had intermittent and long term systemic steroid use in the past.  DEXA order placed today.  Plan: DG BONE DENSITY (DXA)  Positive TB test - 2011.  Most recent chest x-ray was on 10/27/2019 which was stable with no active cardiopulmonary process.  Other medical conditions are listed as follows:  History of gastroesophageal reflux (GERD)  Orders: Orders Placed This Encounter  Procedures  . DG BONE DENSITY (DXA)  . Uric acid   No orders of the defined types were placed in this encounter.     Follow-Up Instructions: Return in about 5 months (around 08/13/2020) for Rheumatoid arthritis, Gout, UC.   Hazel Sams, PA-C  I examined and evaluated the patient with Hazel Sams  PA.  Patient had no synovitis on my examination.  He continued to have some discomfort from underlying osteoarthritis.  Joint protection muscle strengthening was discussed.  The plan of care was discussed as noted above.  I also encouraged him to get his third COVID-19 vaccination.  Bo Merino, MD  Note - This record has been created using Editor, commissioning.  Chart creation errors have been sought, but may not always  have been located. Such creation errors do not reflect on  the standard of medical care.

## 2020-02-29 NOTE — Telephone Encounter (Signed)
Last Visit:09/23/2019 Next Visit:12/22/2019 Labs:01/24/2020 CBC/CMP within normal limits.   Current Dose per office note on 09/23/2019: methotrexate 0.6 mg once weekly  Okay to refill per Dr. Estanislado Pandy

## 2020-03-13 ENCOUNTER — Encounter: Payer: Self-pay | Admitting: Rheumatology

## 2020-03-13 ENCOUNTER — Other Ambulatory Visit: Payer: Self-pay

## 2020-03-13 ENCOUNTER — Ambulatory Visit (INDEPENDENT_AMBULATORY_CARE_PROVIDER_SITE_OTHER): Payer: Medicare Other | Admitting: Rheumatology

## 2020-03-13 VITALS — BP 128/78 | HR 57 | Resp 15 | Ht 71.0 in | Wt 226.0 lb

## 2020-03-13 DIAGNOSIS — Z7952 Long term (current) use of systemic steroids: Secondary | ICD-10-CM

## 2020-03-13 DIAGNOSIS — M1380 Other specified arthritis, unspecified site: Secondary | ICD-10-CM

## 2020-03-13 DIAGNOSIS — K51818 Other ulcerative colitis with other complication: Secondary | ICD-10-CM

## 2020-03-13 DIAGNOSIS — Z96653 Presence of artificial knee joint, bilateral: Secondary | ICD-10-CM

## 2020-03-13 DIAGNOSIS — M1A09X0 Idiopathic chronic gout, multiple sites, without tophus (tophi): Secondary | ICD-10-CM | POA: Diagnosis not present

## 2020-03-13 DIAGNOSIS — M12819 Other specific arthropathies, not elsewhere classified, unspecified shoulder: Secondary | ICD-10-CM | POA: Diagnosis not present

## 2020-03-13 DIAGNOSIS — Z79899 Other long term (current) drug therapy: Secondary | ICD-10-CM

## 2020-03-13 DIAGNOSIS — M19041 Primary osteoarthritis, right hand: Secondary | ICD-10-CM

## 2020-03-13 DIAGNOSIS — M65341 Trigger finger, right ring finger: Secondary | ICD-10-CM

## 2020-03-13 DIAGNOSIS — M24521 Contracture, right elbow: Secondary | ICD-10-CM

## 2020-03-13 DIAGNOSIS — Z8719 Personal history of other diseases of the digestive system: Secondary | ICD-10-CM

## 2020-03-13 DIAGNOSIS — R7611 Nonspecific reaction to tuberculin skin test without active tuberculosis: Secondary | ICD-10-CM

## 2020-03-13 DIAGNOSIS — M19042 Primary osteoarthritis, left hand: Secondary | ICD-10-CM

## 2020-03-13 DIAGNOSIS — Z13820 Encounter for screening for osteoporosis: Secondary | ICD-10-CM

## 2020-03-13 NOTE — Patient Instructions (Addendum)
Standing Labs We placed an order today for your standing lab work.   Please have your standing labs drawn in October and every 3 months   If possible, please have your labs drawn 2 weeks prior to your appointment so that the provider can discuss your results at your appointment.  We have open lab daily Monday through Thursday from 8:30-12:30 PM and 1:30-4:30 PM and Friday from 8:30-12:30 PM and 1:30-4:00 PM at the office of Dr. Bo Merino, [facility].   Please be advised, patients with office appointments requiring lab work will take precedents over walk-in lab work.  If possible, please come for your lab work on Monday and Friday afternoons, as you may experience shorter wait times. The office is located at [address], [place], [place], [place] [address] No appointment is necessary.   Labs are drawn by [facility]. Please bring your co-pay at the time of your lab draw.  You may receive a bill from [facility] for your lab work.  If you wish to have your labs drawn at another location, please call the office 24 hours in advance to send orders.  If you have any questions regarding directions or hours of operation,  please call [phone].   As a reminder, please drink plenty of water prior to coming for your lab work. Thanks!  COVID-19 vaccine recommendations:   COVID-19 vaccine is recommended for everyone (unless you are allergic to a vaccine component), even if you are on a medication that suppresses your immune system.   If you are on Methotrexate, Cellcept (mycophenolate), Rinvoq, Morrie Sheldon, and Olumiant- hold the medication for 1 week after each vaccine. Hold Methotrexate for 2 weeks after the single dose COVID-19 vaccine.   If you are on Orencia subcutaneous injection - hold medication one week prior to and one week after the first COVID-19 vaccine dose (only).   If you are on Orencia IV infusions- time vaccination administration so that the first COVID-19 vaccination  will occur four weeks after the infusion and postpone the subsequent infusion by one week.   If you are on Cyclophosphamide or Rituxan infusions please contact your doctor prior to receiving the COVID-19 vaccine.   Do not take Tylenol or any anti-inflammatory medications (NSAIDs) 24 hours prior to the COVID-19 vaccination.   There is no direct evidence about the efficacy of the COVID-19 vaccine in individuals who are on medications that suppress the immune system.   Even if you are fully vaccinated, and you are on any medications that suppress your immune system, please continue to wear a mask, maintain at least six feet social distance and practice hand hygiene.   If you develop a COVID-19 infection, please contact your PCP or our office to determine if you need antibody infusion.  The booster vaccine is now available for immunocompromised patients. It is advised that if you had Pfizer vaccine you should get [employer] booster.  If you had a Moderna vaccine then you should get a Moderna booster. Johnson and Wynetta Emery does not have a booster vaccine at this time.  Please see the following web sites for updated information.   https://[email or web address]/[email or web address]/0/Files/[email or web address]-19-Vaccination-Patient-Resources.[email or web address]  https://[email or web address].org/About-Us/Newsroom/Press-Releases/ID/1159

## 2020-03-21 ENCOUNTER — Ambulatory Visit: Payer: Medicare Other

## 2020-03-21 DIAGNOSIS — M85852 Other specified disorders of bone density and structure, left thigh: Secondary | ICD-10-CM | POA: Diagnosis not present

## 2020-03-21 DIAGNOSIS — M85851 Other specified disorders of bone density and structure, right thigh: Secondary | ICD-10-CM | POA: Diagnosis not present

## 2020-03-22 ENCOUNTER — Telehealth: Payer: Self-pay | Admitting: *Deleted

## 2020-03-22 NOTE — Telephone Encounter (Signed)
Attempted to contact the patient and left message for patient to call the office.  

## 2020-03-22 NOTE — Telephone Encounter (Signed)
Received DEXA results from [facility].  Date of Scan: 03/21/2020 Lowest T-score and site measured: -1.3 Left Femoral Neck Significant changes in BMD and site measured (5% and above): n/a   Current Regimen:n/a  Recommendation: Calcium, Vitamin D, resistive exercises and repeat DEXA in 2 years.

## 2020-03-22 NOTE — Telephone Encounter (Signed)
Patient advised of results and recommendations Calcium, Vitamin D, resistive exercises and repeat DEXA in 2 years.

## 2020-03-23 DIAGNOSIS — Z20828 Contact with and (suspected) exposure to other viral communicable diseases: Secondary | ICD-10-CM | POA: Diagnosis not present

## 2020-03-24 ENCOUNTER — Other Ambulatory Visit: Payer: Self-pay | Admitting: Internal Medicine

## 2020-03-24 DIAGNOSIS — K219 Gastro-esophageal reflux disease without esophagitis: Secondary | ICD-10-CM

## 2020-03-30 ENCOUNTER — Other Ambulatory Visit: Payer: Self-pay

## 2020-03-30 ENCOUNTER — Ambulatory Visit (INDEPENDENT_AMBULATORY_CARE_PROVIDER_SITE_OTHER): Payer: Medicare Other | Admitting: Pharmacist

## 2020-03-30 VITALS — BP 127/70 | HR 56

## 2020-03-30 DIAGNOSIS — M1380 Other specified arthritis, unspecified site: Secondary | ICD-10-CM | POA: Diagnosis not present

## 2020-03-30 MED ORDER — CERTOLIZUMAB PEGOL 2 X 200 MG ~~LOC~~ KIT
400.0000 mg | PACK | Freq: Once | SUBCUTANEOUS | Status: AC
  Administered 2020-03-30: 400 mg via SUBCUTANEOUS

## 2020-03-30 NOTE — Progress Notes (Signed)
Pharmacy Note  Subjective:   Patient presents to clinic today to receive monthly dose of Cimzia.  Patient running a fever or have signs/symptoms of infection? No  Patient currently on antibiotics for the treatment of infection? No  Patient have any upcoming invasive procedures/surgeries? No  Objective: CMP     Component Value Date/Time   NA 138 01/24/2020 0917   NA 141 11/19/2016 0718   K 4.8 01/24/2020 0917   CL 106 01/24/2020 0917   CO2 27 01/24/2020 0917   GLUCOSE 115 (H) 01/24/2020 0917   BUN 17 01/24/2020 0917   BUN 14 11/19/2016 0718   CREATININE 0.89 01/24/2020 0917   CALCIUM 9.3 01/24/2020 0917   PROT 7.2 01/24/2020 0917   PROT 7.1 11/19/2016 0718   ALBUMIN 4.4 11/19/2016 0718   AST 30 01/24/2020 0917   ALT 41 01/24/2020 0917   ALKPHOS 65 11/19/2016 0718   BILITOT 0.7 01/24/2020 0917   BILITOT 0.5 11/19/2016 0718   GFRNONAA 87 01/24/2020 0917   GFRAA 101 01/24/2020 0917    CBC    Component Value Date/Time   WBC 6.8 01/24/2020 0917   RBC 4.63 01/24/2020 0917   HGB 15.0 01/24/2020 0917   HGB 14.9 11/19/2016 0718   HCT 45.0 01/24/2020 0917   HCT 42.7 11/19/2016 0718   PLT 222 01/24/2020 0917   PLT 255 11/19/2016 0718   MCV 97.2 01/24/2020 0917   MCV 93 11/19/2016 0718   MCH 32.4 01/24/2020 0917   MCHC 33.3 01/24/2020 0917   RDW 14.3 01/24/2020 0917   RDW 15.3 11/19/2016 0718   LYMPHSABS 2,190 01/24/2020 0917   LYMPHSABS 2.7 11/19/2016 0718   MONOABS 640 05/09/2016 0709   EOSABS 170 01/24/2020 0917   EOSABS 0.2 11/19/2016 0718   BASOSABS 27 01/24/2020 0917   BASOSABS 0.0 11/19/2016 0718    Baseline Immunosuppressant Therapy Labs TB GOLD TB GOLD History of positive TB and treated.  Last chest x-ray normal April 2021.  Hepatitis Panel Hepatitis Latest Ref Rng & Units 12/22/2019  Hep B Surface Ag NON-REACTI NON-REACTIVE  Hep B IgM NON-REACTI NON-REACTIVE  Hep C Ab NON-REACTI NON-REACTIVE  Hep C Ab NON-REACTI NON-REACTIVE   HIV No results  found for: HIV Immunoglobulins Immunoglobulin Electrophoresis Latest Ref Rng & Units 12/22/2019  IgA  70 - 320 mg/dL 392(H)  IgG 600 - 1,540 mg/dL 1,276  IgM 50 - 300 mg/dL 148   SPEP Serum Protein Electrophoresis Latest Ref Rng & Units 01/24/2020  Total Protein 6.1 - 8.1 g/dL 7.2  Albumin 3.8 - 4.8 g/dL -  Alpha-1 0.2 - 0.3 g/dL -  Alpha-2 0.5 - 0.9 g/dL -  Beta Globulin 0.4 - 0.6 g/dL -  Beta 2 0.2 - 0.5 g/dL -  Gamma Globulin 0.8 - 1.7 g/dL -   G6PD No results found for: G6PDH TPMT No results found for: TPMT    Assessment/Plan:   Administrations This Visit    Certolizumab Pegol KIT 400 mg    Admin Date 03/30/2020 Action Given Dose 400 mg Route Subcutaneous Administered By Mariella Saa C, RPH-CPP         Patient tolerated injection without issue.   Appointment for next injection scheduled for 10/14 @ 9AM.  Patient due for labs in October with next injection and chest x-ray in April 2022.  Patient is to call and reschedule appointment if running a fever with signs/symptoms of infection, on antibiotics for active infection or has an upcoming invasive procedure.  All questions encouraged and  answered.  Instructed patient to call with any further questions or concerns.   Mariella Saa, PharmD, [place], CPP Clinical Specialty Pharmacist (Rheumatology and Pulmonology)  03/30/2020 9:34 AM

## 2020-04-11 ENCOUNTER — Telehealth: Payer: Self-pay | Admitting: Internal Medicine

## 2020-04-11 ENCOUNTER — Encounter: Payer: Self-pay | Admitting: Internal Medicine

## 2020-04-11 DIAGNOSIS — K219 Gastro-esophageal reflux disease without esophagitis: Secondary | ICD-10-CM

## 2020-04-11 MED ORDER — PANTOPRAZOLE SODIUM 40 MG PO TBEC: 40.0000 mg | DELAYED_RELEASE_TABLET | Freq: Every day | ORAL | 0 refills | Status: DC

## 2020-04-11 NOTE — Telephone Encounter (Signed)
Pt is requesting a call back from a nurse to obtain a refill on his medication pantoprazole.

## 2020-04-21 ENCOUNTER — Other Ambulatory Visit: Payer: Self-pay | Admitting: Rheumatology

## 2020-04-21 NOTE — Telephone Encounter (Signed)
Last Visit: 03/13/2020 Next Visit: 08/14/2020 Labs: 01/24/2020 CBC/CMP within normal limits. Uric Acid 07/02/2019 Uric acid is within desirable range.   Current Dose per office note on 03/13/2020: allopurinol 300 mg po daily   Okay to refill per Dr. Estanislado Pandy

## 2020-04-27 ENCOUNTER — Ambulatory Visit (INDEPENDENT_AMBULATORY_CARE_PROVIDER_SITE_OTHER): Payer: Medicare Other | Admitting: *Deleted

## 2020-04-27 ENCOUNTER — Other Ambulatory Visit: Payer: Self-pay

## 2020-04-27 VITALS — BP 162/75 | HR 62

## 2020-04-27 DIAGNOSIS — M1380 Other specified arthritis, unspecified site: Secondary | ICD-10-CM

## 2020-04-27 DIAGNOSIS — Z79899 Other long term (current) drug therapy: Secondary | ICD-10-CM

## 2020-04-27 DIAGNOSIS — M1A09X0 Idiopathic chronic gout, multiple sites, without tophus (tophi): Secondary | ICD-10-CM

## 2020-04-27 MED ORDER — CERTOLIZUMAB PEGOL 2 X 200 MG ~~LOC~~ KIT
400.0000 mg | PACK | Freq: Once | SUBCUTANEOUS | Status: AC
  Administered 2020-04-27: 400 mg via SUBCUTANEOUS

## 2020-04-27 NOTE — Progress Notes (Signed)
Pharmacy Note  Subjective:   Patient presents to clinic today to receive monthly dose of Cimzia.  Patient running a fever or have signs/symptoms of infection? No  Patient currently on antibiotics for the treatment of infection? No  Patient have any upcoming invasive procedures/surgeries? Yes Patient having a tooth extraction on 05/10/2020. Per Dr. Estanislado Pandy okay to give Cimzia.   Objective: CMP     Component Value Date/Time   NA 138 01/24/2020 0917   NA 141 11/19/2016 0718   K 4.8 01/24/2020 0917   CL 106 01/24/2020 0917   CO2 27 01/24/2020 0917   GLUCOSE 115 (H) 01/24/2020 0917   BUN 17 01/24/2020 0917   BUN 14 11/19/2016 0718   CREATININE 0.89 01/24/2020 0917   CALCIUM 9.3 01/24/2020 0917   PROT 7.2 01/24/2020 0917   PROT 7.1 11/19/2016 0718   ALBUMIN 4.4 11/19/2016 0718   AST 30 01/24/2020 0917   ALT 41 01/24/2020 0917   ALKPHOS 65 11/19/2016 0718   BILITOT 0.7 01/24/2020 0917   BILITOT 0.5 11/19/2016 0718   GFRNONAA 87 01/24/2020 0917   GFRAA 101 01/24/2020 0917    CBC    Component Value Date/Time   WBC 6.8 01/24/2020 0917   RBC 4.63 01/24/2020 0917   HGB 15.0 01/24/2020 0917   HGB 14.9 11/19/2016 0718   HCT 45.0 01/24/2020 0917   HCT 42.7 11/19/2016 0718   PLT 222 01/24/2020 0917   PLT 255 11/19/2016 0718   MCV 97.2 01/24/2020 0917   MCV 93 11/19/2016 0718   MCH 32.4 01/24/2020 0917   MCHC 33.3 01/24/2020 0917   RDW 14.3 01/24/2020 0917   RDW 15.3 11/19/2016 0718   LYMPHSABS 2,190 01/24/2020 0917   LYMPHSABS 2.7 11/19/2016 0718   MONOABS 640 05/09/2016 0709   EOSABS 170 01/24/2020 0917   EOSABS 0.2 11/19/2016 0718   BASOSABS 27 01/24/2020 0917   BASOSABS 0.0 11/19/2016 0718    Baseline Immunosuppressant Therapy Labs TB GOLD   Hepatitis Panel Hepatitis Latest Ref Rng & Units 12/22/2019  Hep B Surface Ag NON-REACTI NON-REACTIVE  Hep B IgM NON-REACTI NON-REACTIVE  Hep C Ab NON-REACTI NON-REACTIVE  Hep C Ab NON-REACTI NON-REACTIVE   HIV No  results found for: HIV Immunoglobulins Immunoglobulin Electrophoresis Latest Ref Rng & Units 12/22/2019  IgA  70 - 320 mg/dL 392(H)  IgG 600 - 1,540 mg/dL 1,276  IgM 50 - 300 mg/dL 148   SPEP Serum Protein Electrophoresis Latest Ref Rng & Units 01/24/2020  Total Protein 6.1 - 8.1 g/dL 7.2  Albumin 3.8 - 4.8 g/dL -  Alpha-1 0.2 - 0.3 g/dL -  Alpha-2 0.5 - 0.9 g/dL -  Beta Globulin 0.4 - 0.6 g/dL -  Beta 2 0.2 - 0.5 g/dL -  Gamma Globulin 0.8 - 1.7 g/dL -   G6PD No results found for: G6PDH TPMT No results found for: TPMT   Chest x-ray: 10/27/2019  Assessment/Plan:   Patient tolerated injection well.   Administrations This Visit    Certolizumab Pegol KIT 400 mg    Admin Date 04/27/2020 Action Given Dose 400 mg Route Subcutaneous Administered By Carole Binning, LPN          Appointment for next injection scheduled for 05/25/2020.  Patient due for labs today. Patient had them drawn while in office today.  Patient is to call and reschedule appointment if running a fever with signs/symptoms of infection, on antibiotics for active infection or has an upcoming invasive procedure.  All questions encouraged and  answered.  Instructed patient to call with any further questions or concerns.

## 2020-04-27 NOTE — Patient Instructions (Signed)
Standing Labs We placed an order today for your standing lab work.   Please have your standing labs drawn in January and every 3 months.  If possible, please have your labs drawn 2 weeks prior to your appointment so that the provider can discuss your results at your appointment.  We have open lab daily Monday through Thursday from 8:30-12:30 PM and 1:30-4:30 PM and Friday from 8:30-12:30 PM and 1:30-4:00 PM at the office of Dr. Bo Merino, [facility].   Please be advised, patients with office appointments requiring lab work will take precedents over walk-in lab work.  If possible, please come for your lab work on Monday and Friday afternoons, as you may experience shorter wait times. The office is located at [address] [place], [place], [place] [address] No appointment is necessary.   Labs are drawn by [facility]. Please bring your co-pay at the time of your lab draw.  You may receive a bill from [facility] for your lab work.  If you wish to have your labs drawn at another location, please call the office 24 hours in advance to send orders.  If you have any questions regarding directions or hours of operation,  please call [phone].   As a reminder, please drink plenty of water prior to coming for your lab work. Thanks!

## 2020-04-28 LAB — COMPLETE METABOLIC PANEL WITH GFR
AG Ratio: 1.4 (calc) (ref 1.0–2.5)
ALT: 27 U/L (ref 9–46)
AST: 25 U/L (ref 10–35)
Albumin: 4.2 g/dL (ref 3.6–5.1)
Alkaline phosphatase (APISO): 47 U/L (ref 35–144)
BUN: 12 mg/dL (ref 7–25)
CO2: 29 mmol/L (ref 20–32)
Calcium: 9.4 mg/dL (ref 8.6–10.3)
Chloride: 103 mmol/L (ref 98–110)
Creat: 0.83 mg/dL (ref 0.70–1.18)
GFR, Est African American: 103 mL/min/{1.73_m2} (ref >=60)
GFR, Est Non African American: 89 mL/min/{1.73_m2} (ref >=60)
Globulin: 3.1 g/dL (calc) (ref 1.9–3.7)
Glucose, Bld: 110 mg/dL — ABNORMAL HIGH (ref 65–99)
Potassium: 4.6 mmol/L (ref 3.5–5.3)
Sodium: 138 mmol/L (ref 135–146)
Total Bilirubin: 0.8 mg/dL (ref 0.2–1.2)
Total Protein: 7.3 g/dL (ref 6.1–8.1)

## 2020-04-28 LAB — CBC WITH DIFFERENTIAL/PLATELET
Absolute Monocytes: 634 cells/uL (ref 200–950)
Basophils Absolute: 29 cells/uL (ref 0–200)
Basophils Relative: 0.4 %
Eosinophils Absolute: 238 cells/uL (ref 15–500)
Eosinophils Relative: 3.3 %
HCT: 43.3 % (ref 38.5–50.0)
Hemoglobin: 14.8 g/dL (ref 13.2–17.1)
Lymphs Abs: 2484 cells/uL (ref 850–3900)
MCH: 32.6 pg (ref 27.0–33.0)
MCHC: 34.2 g/dL (ref 32.0–36.0)
MCV: 95.4 fL (ref 80.0–100.0)
MPV: 11.5 fL (ref 7.5–12.5)
Monocytes Relative: 8.8 %
Neutro Abs: 3816 cells/uL (ref 1500–7800)
Neutrophils Relative %: 53 %
Platelets: 230 10*3/uL (ref 140–400)
RBC: 4.54 10*6/uL (ref 4.20–5.80)
RDW: 14.2 % (ref 11.0–15.0)
Total Lymphocyte: 34.5 %
WBC: 7.2 10*3/uL (ref 3.8–10.8)

## 2020-04-28 LAB — URIC ACID: Uric Acid, Serum: 5.8 mg/dL (ref 4.0–8.0)

## 2020-04-28 NOTE — Progress Notes (Signed)
Uric acid is within the desirable range.

## 2020-04-28 NOTE — Progress Notes (Signed)
CBC and CMP are normal.  Glucose is mildly elevated, probably not a fasting sample.

## 2020-05-05 ENCOUNTER — Other Ambulatory Visit: Payer: Self-pay | Admitting: Rheumatology

## 2020-05-05 NOTE — Telephone Encounter (Signed)
Last Visit: 03/13/2020 Next Visit: 08/14/2020 Labs:04/27/2020 CBC and CMP are normal. Glucose is mildly elevated, probably not a fasting sample.  Current Dose per office note on 03/13/2020: mitigare 0.6 mg po daily DX: Idiopathic chronic gout of multiple sites without tophus   Okay to refill per Dr. Estanislado Pandy

## 2020-05-12 ENCOUNTER — Other Ambulatory Visit: Payer: Self-pay | Admitting: Internal Medicine

## 2020-05-12 DIAGNOSIS — K219 Gastro-esophageal reflux disease without esophagitis: Secondary | ICD-10-CM

## 2020-05-25 ENCOUNTER — Other Ambulatory Visit: Payer: Self-pay

## 2020-05-25 ENCOUNTER — Ambulatory Visit (INDEPENDENT_AMBULATORY_CARE_PROVIDER_SITE_OTHER): Payer: Medicare Other | Admitting: *Deleted

## 2020-05-25 VITALS — BP 128/53 | HR 65

## 2020-05-25 DIAGNOSIS — M0609 Rheumatoid arthritis without rheumatoid factor, multiple sites: Secondary | ICD-10-CM

## 2020-05-25 DIAGNOSIS — M1380 Other specified arthritis, unspecified site: Secondary | ICD-10-CM

## 2020-05-25 MED ORDER — CERTOLIZUMAB PEGOL 2 X 200 MG ~~LOC~~ KIT
400.0000 mg | PACK | Freq: Once | SUBCUTANEOUS | Status: AC
  Administered 2020-05-25: 400 mg via SUBCUTANEOUS

## 2020-05-25 NOTE — Progress Notes (Signed)
Pharmacy Note  Subjective:   Patient presents to clinic today to receive monthly dose of Cimzia.  Patient running a fever or have signs/symptoms of infection? No  Patient currently on antibiotics for the treatment of infection? No  Patient have any upcoming invasive procedures/surgeries? No  Objective: CMP     Component Value Date/Time   NA 138 04/27/2020 0932   NA 141 11/19/2016 0718   K 4.6 04/27/2020 0932   CL 103 04/27/2020 0932   CO2 29 04/27/2020 0932   GLUCOSE 110 (H) 04/27/2020 0932   BUN 12 04/27/2020 0932   BUN 14 11/19/2016 0718   CREATININE 0.83 04/27/2020 0932   CALCIUM 9.4 04/27/2020 0932   PROT 7.3 04/27/2020 0932   PROT 7.1 11/19/2016 0718   ALBUMIN 4.4 11/19/2016 0718   AST 25 04/27/2020 0932   ALT 27 04/27/2020 0932   ALKPHOS 65 11/19/2016 0718   BILITOT 0.8 04/27/2020 0932   BILITOT 0.5 11/19/2016 0718   GFRNONAA 89 04/27/2020 0932   GFRAA 103 04/27/2020 0932    CBC    Component Value Date/Time   WBC 7.2 04/27/2020 0932   RBC 4.54 04/27/2020 0932   HGB 14.8 04/27/2020 0932   HGB 14.9 11/19/2016 0718   HCT 43.3 04/27/2020 0932   HCT 42.7 11/19/2016 0718   PLT 230 04/27/2020 0932   PLT 255 11/19/2016 0718   MCV 95.4 04/27/2020 0932   MCV 93 11/19/2016 0718   MCH 32.6 04/27/2020 0932   MCHC 34.2 04/27/2020 0932   RDW 14.2 04/27/2020 0932   RDW 15.3 11/19/2016 0718   LYMPHSABS 2,484 04/27/2020 0932   LYMPHSABS 2.7 11/19/2016 0718   MONOABS 640 05/09/2016 0709   EOSABS 238 04/27/2020 0932   EOSABS 0.2 11/19/2016 0718   BASOSABS 29 04/27/2020 0932   BASOSABS 0.0 11/19/2016 0718    Baseline Immunosuppressant Therapy Labs TB GOLD   Hepatitis Panel Hepatitis Latest Ref Rng & Units 12/22/2019  Hep B Surface Ag NON-REACTI NON-REACTIVE  Hep B IgM NON-REACTI NON-REACTIVE  Hep C Ab NON-REACTI NON-REACTIVE  Hep C Ab NON-REACTI NON-REACTIVE   HIV No results found for: HIV Immunoglobulins Immunoglobulin Electrophoresis Latest Ref Rng & Units  12/22/2019  IgA  70 - 320 mg/dL 392(H)  IgG 600 - 1,540 mg/dL 1,276  IgM 50 - 300 mg/dL 148   SPEP Serum Protein Electrophoresis Latest Ref Rng & Units 04/27/2020  Total Protein 6.1 - 8.1 g/dL 7.3  Albumin 3.8 - 4.8 g/dL -  Alpha-1 0.2 - 0.3 g/dL -  Alpha-2 0.5 - 0.9 g/dL -  Beta Globulin 0.4 - 0.6 g/dL -  Beta 2 0.2 - 0.5 g/dL -  Gamma Globulin 0.8 - 1.7 g/dL -   G6PD No results found for: G6PDH TPMT No results found for: TPMT   Chest x-ray: 10/27/2019  Administrations This Visit    Certolizumab Pegol KIT 400 mg    Admin Date 05/25/2020 Action Given Dose 400 mg Route Subcutaneous Administered By ,  L, LPN          Assessment/Plan:   Patient tolerated injection well.   Appointment for next injection scheduled for 06/22/2020.  Patient due for labs in January 2022.  Patient is to call and reschedule appointment if running a fever with signs/symptoms of infection, on antibiotics for active infection or has an upcoming invasive procedure.  All questions encouraged and answered.  Instructed patient to call with any further questions or concerns.  

## 2020-06-02 ENCOUNTER — Ambulatory Visit (AMBULATORY_SURGERY_CENTER): Payer: Self-pay | Admitting: *Deleted

## 2020-06-02 ENCOUNTER — Other Ambulatory Visit: Payer: Self-pay

## 2020-06-02 VITALS — Ht 71.0 in | Wt 222.0 lb

## 2020-06-02 DIAGNOSIS — Z86010 Personal history of colonic polyps: Secondary | ICD-10-CM

## 2020-06-02 DIAGNOSIS — K51918 Ulcerative colitis, unspecified with other complication: Secondary | ICD-10-CM

## 2020-06-02 MED ORDER — SUTAB 1479-225-188 MG PO TABS: 24.0000 | ORAL_TABLET | ORAL | 0 refills | Status: DC

## 2020-06-02 NOTE — Progress Notes (Signed)
Fully vax'd  No egg or soy allergy known to patient  No issues with past sedation with any surgeries or procedures no intubation problems in the past  No FH of Malignant Hyperthermia No diet pills per patient No home 02 use per patient  No blood thinners per patient  Pt denies issues with constipation  No A fib or A flutter  EMMI video to pt or via [place] 19 guidelines implemented in PV today with Pt and RN   [place]  Coupon given to pt in [facility] today , Code to Pharmacy   Due to the COVID-19 pandemic we are asking patients to follow these guidelines. Please only bring one care partner. Please be aware that your care partner may wait in the car in the parking lot or if they feel like they will be too hot to wait in the car, they may wait in the lobby on the 4th floor. All care partners are required to wear a mask the entire time (we do not have any that we can provide them), they need to practice social distancing, and we will do a Covid check for all patient's and care partners when you arrive. Also we will check their temperature and your temperature. If the care partner waits in their car they need to stay in the parking lot the entire time and we will call them on their cell phone when the patient is ready for discharge so they can bring the car to the front of the building. Also all patient's will need to wear a mask into building.

## 2020-06-05 ENCOUNTER — Telehealth: Payer: Self-pay | Admitting: *Deleted

## 2020-06-05 DIAGNOSIS — K219 Gastro-esophageal reflux disease without esophagitis: Secondary | ICD-10-CM

## 2020-06-05 MED ORDER — PANTOPRAZOLE SODIUM 40 MG PO TBEC: 40.0000 mg | DELAYED_RELEASE_TABLET | Freq: Every day | ORAL | 0 refills | Status: DC

## 2020-06-05 NOTE — Telephone Encounter (Signed)
-----   Message from Steva Ready, RN sent at 06/02/2020 10:18 AM EST ----- I saw this pt in PV today- he is asking for a 90 day supple of Pantoprazole to be sent to [facility].  Can you check with Dr Raquel James on this please and thank you-- he has a colon 06-16-2020  Thanks, Lelan Pons

## 2020-06-05 NOTE — Telephone Encounter (Signed)
Rx sent 

## 2020-06-14 DIAGNOSIS — U071 COVID-19: Secondary | ICD-10-CM

## 2020-06-14 HISTORY — DX: COVID-19: U07.1

## 2020-06-16 ENCOUNTER — Ambulatory Visit (AMBULATORY_SURGERY_CENTER): Payer: Medicare Other | Admitting: Internal Medicine

## 2020-06-16 ENCOUNTER — Other Ambulatory Visit: Payer: Self-pay

## 2020-06-16 ENCOUNTER — Encounter: Payer: Self-pay | Admitting: Internal Medicine

## 2020-06-16 VITALS — BP 118/68 | HR 52 | Temp 98.4°F | Resp 14 | Ht 71.0 in | Wt 222.0 lb

## 2020-06-16 DIAGNOSIS — K51918 Ulcerative colitis, unspecified with other complication: Secondary | ICD-10-CM

## 2020-06-16 DIAGNOSIS — K5190 Ulcerative colitis, unspecified, without complications: Secondary | ICD-10-CM | POA: Diagnosis not present

## 2020-06-16 DIAGNOSIS — K5150 Left sided colitis without complications: Secondary | ICD-10-CM | POA: Diagnosis not present

## 2020-06-16 DIAGNOSIS — Z86010 Personal history of colonic polyps: Secondary | ICD-10-CM | POA: Diagnosis not present

## 2020-06-16 DIAGNOSIS — K529 Noninfective gastroenteritis and colitis, unspecified: Secondary | ICD-10-CM | POA: Diagnosis not present

## 2020-06-16 DIAGNOSIS — M069 Rheumatoid arthritis, unspecified: Secondary | ICD-10-CM | POA: Diagnosis not present

## 2020-06-16 DIAGNOSIS — I10 Essential (primary) hypertension: Secondary | ICD-10-CM | POA: Diagnosis not present

## 2020-06-16 MED ORDER — SODIUM CHLORIDE 0.9 % IV SOLN: 500.0000 mL | Freq: Once | INTRAVENOUS | Status: DC

## 2020-06-16 NOTE — Progress Notes (Signed)
Report to PACU, RN, vss, BBS= Clear.  

## 2020-06-16 NOTE — Progress Notes (Signed)
Called to room to assist during endoscopic procedure.  Patient ID and intended procedure confirmed with present staff. Received instructions for my participation in the procedure from the performing physician.  

## 2020-06-16 NOTE — Op Note (Signed)
[facility] Patient Name: Christopher Smith Procedure Date: 06/16/2020 9:47 AM MRN: [id number] Endoscopist: Jerene Bears , MD Age: 69 Referring MD:  Date of Birth: 1951-05-06 Gender: Male Account #: [id number] Procedure:                Colonoscopy Indications:              High risk colon cancer surveillance: Ulcerative                            pancolitis of 8 (or more) years duration currently                            on Cimzia and MTX (previously on Humira and                            Remicade), Last colonoscopy: August 2019 Medicines:                Monitored Anesthesia Care Procedure:                Pre-Anesthesia Assessment:                           - Prior to the procedure, a History and Physical                            was performed, and patient medications and                            allergies were reviewed. The patient's tolerance of                            previous anesthesia was also reviewed. The risks                            and benefits of the procedure and the sedation                            options and risks were discussed with the patient.                            All questions were answered, and informed consent                            was obtained. Prior Anticoagulants: The patient has                            taken no previous anticoagulant or antiplatelet                            agents. ASA Grade Assessment: II - A patient with                            mild systemic disease. After reviewing the risks  and benefits, the patient was deemed in                            satisfactory condition to undergo the procedure.                           After obtaining informed consent, the colonoscope                            was passed under direct vision. Throughout the                            procedure, the patient's blood pressure, pulse, and                            oxygen saturations were  monitored continuously. The                            Colonoscope was introduced through the anus and                            advanced to the cecum, identified by appendiceal                            orifice and ileocecal valve. The colonoscopy was                            performed without difficulty. The patient tolerated                            the procedure well. The quality of the bowel                            preparation was good. The ileocecal valve,                            appendiceal orifice, and rectum were photographed. Scope In: 9:58:58 AM Scope Out: 10:12:52 AM Scope Withdrawal Time: 0 hours 12 minutes 7 seconds  Total Procedure Duration: 0 hours 13 minutes 54 seconds  Findings:                 The digital rectal exam was normal.                           Inflammation was found in a continuous and                            circumferential pattern from the rectum to the                            distal sigmoid colon. This was graded as Mayo Score                            1 (mild, with erythema, decreased vascular pattern,  mild friability). The mucosa from the mid sigmoid                            to cecum appeared more normal. Four biopsies were                            taken every 10 cm with a cold forceps from the                            entire colon for dysplasia surveillance and                            ulcerative colitis surveillance. These biopsy                            specimens from the right colon (Jar 1), left colon                            (Jar 2, distal transverse, descending sigmoid to                            mid sigmoid) and rectosigmoid/rectum (Jar 3) colon                            were sent to Pathology. No polypoid lesions.                           Internal hemorrhoids were found during                            retroflexion. The hemorrhoids were small. Complications:            No immediate  complications. Estimated Blood Loss:     Estimated blood loss was minimal. Impression:               - Mild (Mayo Score 1) pancolitis ulcerative colitis                            with most activity in the rectum and distal                            sigmoid. Biopsied.                           - Internal hemorrhoids. Recommendation:           - Patient has a contact number available for                            emergencies. The signs and symptoms of potential                            delayed complications were discussed with the  patient. Return to normal activities tomorrow.                            Written discharge instructions were provided to the                            patient.                           - Resume previous diet.                           - Continue present medications.                           - Colitis is not in complete remission despite                            Cimzia and MTX. Would have patient come for Cimzia                            level and Ab test 1-2 days before next dose. Could                            consider change to [facility].                           - Office follow-up with me in Jan or Feb 2022.                           - Await pathology results.                           - Repeat colonoscopy is recommended for                            surveillance. The colonoscopy date will be                            determined after pathology results from today's                            exam become available for review. Jerene Bears, MD 06/16/2020 10:24:43 AM This report has been signed electronically.

## 2020-06-16 NOTE — Progress Notes (Signed)
Pt's states no medical or surgical changes since previsit or office visit. 

## 2020-06-16 NOTE — Patient Instructions (Signed)
YOU HAD AN ENDOSCOPIC PROCEDURE TODAY AT THE [facility]:   Refer to the procedure report that was given to you for any specific questions about what was found during the examination.  If the procedure report does not answer your questions, please call your gastroenterologist to clarify.  If you requested that your care partner not be given the details of your procedure findings, then the procedure report has been included in a sealed envelope for you to review at your convenience later.  YOU SHOULD EXPECT: Some feelings of bloating in the abdomen. Passage of more gas than usual.  Walking can help get rid of the air that was put into your GI tract during the procedure and reduce the bloating. If you had a lower endoscopy (such as a colonoscopy or flexible sigmoidoscopy) you may notice spotting of blood in your stool or on the toilet paper. If you underwent a bowel prep for your procedure, you may not have a normal bowel movement for a few days.  Please Note:  You might notice some irritation and congestion in your nose or some drainage.  This is from the oxygen used during your procedure.  There is no need for concern and it should clear up in a day or so.  SYMPTOMS TO REPORT IMMEDIATELY:   Following lower endoscopy (colonoscopy or flexible sigmoidoscopy):  Excessive amounts of blood in the stool  Significant tenderness or worsening of abdominal pains  Swelling of the abdomen that is new, acute  Fever of 100F or higher  For urgent or emergent issues, a gastroenterologist can be reached at any hour by calling [phone]. Do not use MyChart messaging for urgent concerns.    DIET:  We do recommend a small meal at first, but then you may proceed to your regular diet.  Drink plenty of fluids but you should avoid alcoholic beverages for 24 hours.  ACTIVITY:  You should plan to take it easy for the rest of today and you should NOT DRIVE or use heavy machinery until tomorrow (because  of the sedation medicines used during the test).    FOLLOW UP: Our staff will call the number listed on your records 48-72 hours following your procedure to check on you and address any questions or concerns that you may have regarding the information given to you following your procedure. If we do not reach you, we will leave a message.  We will attempt to reach you two times.  During this call, we will ask if you have developed any symptoms of COVID 19. If you develop any symptoms (ie: fever, flu-like symptoms, shortness of breath, cough etc.) before then, please call [phone].  If you test positive for Covid 19 in the 2 weeks post procedure, please call and report this information to us.    If any biopsies were taken you will be contacted by phone or by letter within the next 1-3 weeks.  Please call us at [phone] if you have not heard about the biopsies in 3 weeks.    SIGNATURES/CONFIDENTIALITY: You and/or your care partner have signed paperwork which will be entered into your electronic medical record.  These signatures attest to the fact that that the information above on your After Visit Summary has been reviewed and is understood.  Full responsibility of the confidentiality of this discharge information lies with you and/or your care-partner. 

## 2020-06-20 ENCOUNTER — Other Ambulatory Visit: Payer: Self-pay | Admitting: Internal Medicine

## 2020-06-20 ENCOUNTER — Telehealth: Payer: Self-pay

## 2020-06-20 ENCOUNTER — Other Ambulatory Visit: Payer: Medicare Other

## 2020-06-20 DIAGNOSIS — K219 Gastro-esophageal reflux disease without esophagitis: Secondary | ICD-10-CM

## 2020-06-20 DIAGNOSIS — K51918 Ulcerative colitis, unspecified with other complication: Secondary | ICD-10-CM | POA: Diagnosis not present

## 2020-06-20 NOTE — Telephone Encounter (Signed)
  Follow up Call-  Call back number 06/16/2020 03/04/2018  Post procedure Call Back phone  # [phone]  Permission to leave phone message Yes Yes  Some recent data might be hidden     Patient questions:  Do you have a fever, pain , or abdominal swelling? No. Pain Score  0 *  Have you tolerated food without any problems? Yes.    Have you been able to return to your normal activities? Yes.    Do you have any questions about your discharge instructions: Diet   No. Medications  No. Follow up visit  No.  Do you have questions or concerns about your Care? No.  Actions: * If pain score is 4 or above: No action needed, pain <4.  1. Have you developed a fever since your procedure? no  2.   Have you had an respiratory symptoms (SOB or cough) since your procedure? no  3.   Have you tested positive for COVID 19 since your procedure no  4.   Have you had any family members/close contacts diagnosed with the COVID 19 since your procedure?  no   If yes to any of these questions please route to Joylene John, RN and Joella Prince, RN

## 2020-06-22 ENCOUNTER — Other Ambulatory Visit: Payer: Self-pay

## 2020-06-22 ENCOUNTER — Ambulatory Visit (INDEPENDENT_AMBULATORY_CARE_PROVIDER_SITE_OTHER): Payer: Medicare Other | Admitting: *Deleted

## 2020-06-22 ENCOUNTER — Encounter: Payer: Self-pay | Admitting: Internal Medicine

## 2020-06-22 VITALS — BP 116/72 | HR 65

## 2020-06-22 DIAGNOSIS — M1380 Other specified arthritis, unspecified site: Secondary | ICD-10-CM

## 2020-06-22 DIAGNOSIS — M1389 Other specified arthritis, multiple sites: Secondary | ICD-10-CM | POA: Diagnosis not present

## 2020-06-22 MED ORDER — CERTOLIZUMAB PEGOL 2 X 200 MG ~~LOC~~ KIT
400.0000 mg | PACK | Freq: Once | SUBCUTANEOUS | Status: AC
  Administered 2020-06-22: 400 mg via SUBCUTANEOUS

## 2020-06-22 NOTE — Progress Notes (Signed)
Pharmacy Note  Subjective:   Patient presents to clinic today to receive monthly dose of Cimzia.  Patient running a fever or have signs/symptoms of infection? No  Patient currently on antibiotics for the treatment of infection? No  Patient have any upcoming invasive procedures/surgeries? No  Objective: CMP     Component Value Date/Time   NA 138 04/27/2020 0932   NA 141 11/19/2016 0718   K 4.6 04/27/2020 0932   CL 103 04/27/2020 0932   CO2 29 04/27/2020 0932   GLUCOSE 110 (H) 04/27/2020 0932   BUN 12 04/27/2020 0932   BUN 14 11/19/2016 0718   CREATININE 0.83 04/27/2020 0932   CALCIUM 9.4 04/27/2020 0932   PROT 7.3 04/27/2020 0932   PROT 7.1 11/19/2016 0718   ALBUMIN 4.4 11/19/2016 0718   AST 25 04/27/2020 0932   ALT 27 04/27/2020 0932   ALKPHOS 65 11/19/2016 0718   BILITOT 0.8 04/27/2020 0932   BILITOT 0.5 11/19/2016 0718   GFRNONAA 89 04/27/2020 0932   GFRAA 103 04/27/2020 0932    CBC    Component Value Date/Time   WBC 7.2 04/27/2020 0932   RBC 4.54 04/27/2020 0932   HGB 14.8 04/27/2020 0932   HGB 14.9 11/19/2016 0718   HCT 43.3 04/27/2020 0932   HCT 42.7 11/19/2016 0718   PLT 230 04/27/2020 0932   PLT 255 11/19/2016 0718   MCV 95.4 04/27/2020 0932   MCV 93 11/19/2016 0718   MCH 32.6 04/27/2020 0932   MCHC 34.2 04/27/2020 0932   RDW 14.2 04/27/2020 0932   RDW 15.3 11/19/2016 0718   LYMPHSABS 2,484 04/27/2020 0932   LYMPHSABS 2.7 11/19/2016 0718   MONOABS 640 05/09/2016 0709   EOSABS 238 04/27/2020 0932   EOSABS 0.2 11/19/2016 0718   BASOSABS 29 04/27/2020 0932   BASOSABS 0.0 11/19/2016 0718    Baseline Immunosuppressant Therapy Labs TB GOLD: history of positive TB, last chest Xray April 2021 stable   Hepatitis Panel Hepatitis Latest Ref Rng & Units 12/22/2019  Hep B Surface Ag NON-REACTI NON-REACTIVE  Hep B IgM NON-REACTI NON-REACTIVE  Hep C Ab NON-REACTI NON-REACTIVE  Hep C Ab NON-REACTI NON-REACTIVE   HIV No results found for:  HIV Immunoglobulins Immunoglobulin Electrophoresis Latest Ref Rng & Units 12/22/2019  IgA  70 - 320 mg/dL 392(H)  IgG 600 - 1,540 mg/dL 1,276  IgM 50 - 300 mg/dL 148   SPEP Serum Protein Electrophoresis Latest Ref Rng & Units 04/27/2020  Total Protein 6.1 - 8.1 g/dL 7.3  Albumin 3.8 - 4.8 g/dL -  Alpha-1 0.2 - 0.3 g/dL -  Alpha-2 0.5 - 0.9 g/dL -  Beta Globulin 0.4 - 0.6 g/dL -  Beta 2 0.2 - 0.5 g/dL -  Gamma Globulin 0.8 - 1.7 g/dL -   Chest x-ray: 10/27/19 (stable)  Assessment/Plan:   Patient injected in left and right lower abdomen. Patient tolerated injection well. Administered by Francis Gaines.  Administrations This Visit    Certolizumab Pegol KIT 400 mg    Admin Date 06/22/2020 Action Given Dose 400 mg Route Subcutaneous Administered By Earnestine Mealing, CMA         Appointment for next injection scheduled for 07/21/19.  Patient due for labs at next visit.  Standing labs already placed. Patient is to call and reschedule appointment if running a fever with signs/symptoms of infection, on antibiotics for active infection or has an upcoming invasive procedure.   All questions encouraged and answered.  Instructed patient to call with any further  questions or concerns.  Knox Saliva, PharmD, MPH Clinical Pharmacist (Rheumatology and Pulmonology)

## 2020-06-29 LAB — SERIAL MONITORING

## 2020-06-29 LAB — CERTOLIZUMAB AND ANTI-CERTO AB
Anti-Certolizumab Ab Level: 1622 ng/mL
Certolizumab DRUG Level: 7.2 ug/mL

## 2020-06-29 NOTE — Progress Notes (Signed)
I reviewed the labs.  He has developed anticertolizumab antibodies.  He has seronegative inflammatory arthritis which could be related to her ulcerative colitis.  You may start him on Stelara and see if he responds to  [place].  He had Cimzia subcu injection last week. You may time Stelara accordingly.  We will keep him on methotrexate. Thank you, Abel Presto

## 2020-06-30 ENCOUNTER — Other Ambulatory Visit: Payer: Self-pay

## 2020-06-30 DIAGNOSIS — K51918 Ulcerative colitis, unspecified with other complication: Secondary | ICD-10-CM

## 2020-07-06 ENCOUNTER — Telehealth: Payer: Self-pay | Admitting: Rheumatology

## 2020-07-06 NOTE — Telephone Encounter (Signed)
Patient states he is to get the Stelara infusions. Patient states Dr. Raquel James is the ordering physician. Please advise how long patient should be off [employer] prior to scheduling in fusion. Last Cimzia was 06/22/2020.

## 2020-07-06 NOTE — Telephone Encounter (Signed)
Patient and Jinny Blossom at [facility] to advised patient may start Stelara 1 month after last dose of Cimzia which was 06/22/2020.

## 2020-07-06 NOTE — Telephone Encounter (Signed)
Stelara can be started  1 month after the Cimzia dose.

## 2020-07-06 NOTE — Telephone Encounter (Signed)
Patient needs to know how long he needs to be off Cimzia before he can schedule his infusion. Please call to advise patient, and infusion center. You can call [facility] [place].

## 2020-07-06 NOTE — Telephone Encounter (Signed)
Attempted to contact the patient and left message for patient to call the office.  

## 2020-07-09 DIAGNOSIS — Z20828 Contact with and (suspected) exposure to other viral communicable diseases: Secondary | ICD-10-CM | POA: Diagnosis not present

## 2020-07-09 DIAGNOSIS — R509 Fever, unspecified: Secondary | ICD-10-CM | POA: Diagnosis not present

## 2020-07-18 ENCOUNTER — Other Ambulatory Visit: Payer: Self-pay | Admitting: Rheumatology

## 2020-07-18 ENCOUNTER — Telehealth: Payer: Self-pay | Admitting: Physician Assistant

## 2020-07-18 NOTE — Telephone Encounter (Signed)
Pt was approved for stelara infusion at [facility] but for the injection he would have to get this from his pharmacy and self inject. His copay would be 3,000/month and pt cannot afford this. He wants to know what else Dr. Hilarie Fredrickson recommends. Please advise.

## 2020-07-18 NOTE — Telephone Encounter (Signed)
Last Visit: 03/13/2020 Next Visit: 08/14/2020 Labs: 04/27/2020 CBC and CMP are normal. Glucose is mildly elevated, probably not a fasting sample.  Current Dose per office note on 03/13/2020: methotrexate 0.6 mL every 7 days DX: Seronegative arthritis  Okay to refill methotrexate?

## 2020-07-18 NOTE — Telephone Encounter (Signed)
Please have him work on that paperwork and let's see if he qualifies We will also see if Dr. Estanislado Pandy has other ideas Thanks

## 2020-07-18 NOTE — Telephone Encounter (Signed)
Christopher Smith ( I have included Dr. Estanislado Pandy too), Is the high expense for infusion or ongoing once the dosing changes to home subcutaneous injection? Can the pharm company help reduce the cost of this medication? He has previously used Remicade, Humira and Cimzia so these medications are out.  Christopher Smith is an option but likely will not help with rheumatologic conditions as this is gut specific. Dr. Estanislado Pandy what are your thoughts about Christopher Smith?  Any other recommendations from medication perspective?  Thanks to all [facility]

## 2020-07-18 NOTE — Telephone Encounter (Signed)
Pt would like to speak with you about an infusion that he needs. Pls call him.

## 2020-07-18 NOTE — Telephone Encounter (Signed)
Spoke with patient, he states that he was scheduled for his infusion but they were unable to tell him his out of pocket cost and he had to cancel the appt. Patient stated that he did not want to get started with the infusion and then not know how much the injections would be after that, pt states that he is on social security and just can't start the medication not knowing how much he will have to pay for the maintenance dose.

## 2020-07-18 NOTE — Telephone Encounter (Signed)
The problem with the cost begins with the [facility]. He could fill out a pt assistance form with the [employer] pt assist foundation to see if he could qualify for assistance.

## 2020-07-19 NOTE — Telephone Encounter (Signed)
[name],  Let's have him pursue the patient assistance option for Stelara given his previous use of other anti-TNF therapies preventing their use now.

## 2020-07-19 NOTE — Telephone Encounter (Signed)
Left message for pt to call back. [employer] pt assistance form printed out and ready to mail to pt.

## 2020-07-19 NOTE — Telephone Encounter (Signed)
I agree, patient assistance will be the only other option.

## 2020-07-19 NOTE — Telephone Encounter (Signed)
Spoke with pt and he will come by the office to pick up the assistance forms and then return them for [place] to fax them in for him. Forms up front for pickup.

## 2020-07-20 ENCOUNTER — Ambulatory Visit: Payer: Medicare Other

## 2020-07-24 NOTE — Telephone Encounter (Signed)
Nurse with [facility] called to follow up on order for infusion. I let her know about pt's applying for [employer] assistance. She will leave order open and follow up with [place] at a later time.

## 2020-08-01 NOTE — Progress Notes (Signed)
Office Visit Note  Patient: Christopher Smith             Date of Birth: [birth year]           MRN: [id number]             PCP: Thressa Sheller, MD (Inactive) Referring: No ref. provider found Visit Date: 08/14/2020 Occupation: @GUAROCC @  Subjective:  Other (Bilateral hand pain )   History of Present Illness: Christopher Smith is a 70 y.o. male with history of seronegative inflammatory arthritis, ulcerative colitis, osteoarthritis and gout.  He denies having any gout flare.  He states he has been off Cimzia since November.  He states he is doing better being off Cimzia.  He has not had any inflammation or lower back pain.  He was placed on Stelara infusion by Dr. Hilarie Fredrickson.  He is a still on methotrexate.  He denies having any increased joint swelling.  He continues to have some stiffness in his hands due to underlying osteoarthritis.  The lower back pain has improved.  He had very good response to right trigger ring finger injection.  Activities of Daily Living:  Patient reports morning stiffness for 10  minutes.   Patient Denies nocturnal pain.  Difficulty dressing/grooming: Denies Difficulty climbing stairs: Denies Difficulty getting out of chair: Denies Difficulty using hands for taps, buttons, cutlery, and/or writing: Reports  Review of Systems  Constitutional: Negative for fatigue.  HENT: Negative for mouth sores, mouth dryness and nose dryness.   Eyes: Negative for pain, itching and dryness.  Respiratory: Negative for shortness of breath and difficulty breathing.   Cardiovascular: Negative for chest pain and palpitations.  Gastrointestinal: Positive for diarrhea. Negative for blood in stool and constipation.  Endocrine: Negative for increased urination.  Genitourinary: Negative for difficulty urinating.  Musculoskeletal: Positive for arthralgias, joint pain, joint swelling, myalgias, morning stiffness and myalgias. Negative for muscle tenderness.  Skin: Negative for color change,  rash and redness.  Allergic/Immunologic: Negative for susceptible to infections.  Neurological: Negative for dizziness, numbness, headaches, memory loss and weakness.  Hematological: Negative for bruising/bleeding tendency.  Psychiatric/Behavioral: Negative for confusion.    PMFS History:  Patient Active Problem List   Diagnosis Date Noted  . Open fracture of one or more phalanges of hand 11/21/2017  . Chronic inflammatory arthritis 08/05/2016  . High risk medication use 08/05/2016  . Contracture of elbow, right 08/05/2016  . History of total knee replacement, bilateral 08/05/2016  . 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 History of bilateral rotator cuff tear repair 08/05/2016  . Gastroesophageal reflux  08/05/2016  . Latent tuberculosis by blood test 08/05/2016  . Ulcerative colitis 09/03/2011  . Glucose intolerance (impaired glucose tolerance) 09/03/2011  . Severe diarrhea 09/03/2011  . URI (upper respiratory infection) 07/02/2011  . Bile salt-induced diarrhea 07/02/2011  . S/P cholecystectomy 07/02/2011  . Ulcerative colitis, unspecified 04/03/2011  . Benign neoplasm of colon 04/03/2011  . Positive TB test 03/22/2011  . Insomnia due to medical condition 03/22/2011  . UC (ulcerative colitis) ([place]) 03/01/2011  . Leg cramps 03/01/2011  . Ulcerative colitis with rectal bleeding ([place]) 01/24/2011  . Nonspecific abnormal finding in stool contents 01/24/2011  . Heme positive stool 01/11/2011  . Diarrhea 01/11/2011  . Arthritis associated with inflammatory bowel disease 01/11/2011  . Intentional weight loss 01/11/2011    Past Medical History:  Diagnosis Date  . Allergy   . Anal fissure   . Arthritis    RA  .  Blood transfusion without reported diagnosis   . Diabetes mellitus    no meds taken 05-01-16- elevated glucose was from predisone for UC  . Diarrhea   . Fatty liver   . GERD (gastroesophageal reflux disease)   . Gout   . Hiatal hernia   .  Hypertension   . Internal hemorrhoids   . Polyarthritis   . PONV (postoperative nausea and vomiting)   . Sessile rectal polyp 03/27/2011  . Sinusitis   . Tuberculosis    positive PPD- had an allergic reaction to the PPD, gets yearly chest xray  . Tubular adenoma of colon   . Ulcerative colitis 01/24/2011    Family History  Problem Relation Age of Onset  . Alzheimer's disease Father   . Stroke Mother   . Cancer Mother        liver & breast   . Breast cancer Mother   . Liver cancer Mother   . Rheum arthritis Brother   . Rheum arthritis Brother   . Rheum arthritis Sister   . Rheum arthritis Sister   . Rheum arthritis Sister   . Breast cancer Sister   . Colon cancer Neg Hx   . Esophageal cancer Neg Hx   . Stomach cancer Neg Hx   . Rectal cancer Neg Hx   . Colon polyps Neg Hx    Past Surgical History:  Procedure Laterality Date  . CHOLECYSTECTOMY    . COLONOSCOPY    . KNEE SURGERY     Bilateral   . POLYPECTOMY    . SHOULDER SURGERY Bilateral    Bilateral - rotator cuff repair   . TOTAL KNEE ARTHROPLASTY Bilateral    Social History   Social History Narrative   1 caffeine drink daily    Immunization History  Administered Date(s) Administered  . Influenza, High Dose Seasonal PF 04/28/2019  . Moderna Sars-Covid-2 Vaccination 09/08/2019, 10/06/2019  . Tdap 11/19/2017     Objective: Vital Signs: BP (!) 155/78 (BP Location: Left Arm, Patient Position: Sitting, Cuff Size: Normal)   Pulse 60   Resp 15   Ht 5' 11"  (1.803 m)   Wt 224 lb (101.6 kg)   BMI 31.24 kg/m    Physical Exam Vitals and nursing note reviewed.  Constitutional:      Appearance: He is well-developed and well-nourished.  HENT:     Head: Normocephalic and atraumatic.  Eyes:     Extraocular Movements: EOM normal.     Conjunctiva/sclera: Conjunctivae normal.     Pupils: Pupils are equal, round, and reactive to light.  Cardiovascular:     Rate and Rhythm: Normal rate and regular rhythm.     Heart  sounds: Normal heart sounds.  Pulmonary:     Effort: Pulmonary effort is normal.     Breath sounds: Normal breath sounds.  Abdominal:     General: Bowel sounds are normal.     Palpations: Abdomen is soft.  Musculoskeletal:     Cervical back: Normal range of motion and neck supple.  Skin:    General: Skin is warm and dry.     Capillary Refill: Capillary refill takes less than 2 seconds.  Neurological:     Mental Status: He is alert and oriented to person, place, and time.  Psychiatric:        Mood and Affect: Mood and affect normal.        Behavior: Behavior normal.      Musculoskeletal Exam: C-spine was in good range of motion.  Shoulder joints, elbow joints, wrist joints, MCPs PIPs and DIPs with good range of motion with no synovitis.  Hip joints, knee joints, ankles, MTPs and PIPs with good range of motion with no synovitis.  He has some osteoarthritic changes with PIP and DIP thickening bilaterally.  CDAI Exam: CDAI Score: 0.2  Patient Global: 1 mm; Provider Global: 1 mm Swollen: 0 ; Tender: 0  Joint Exam 08/14/2020   No joint exam has been documented for this visit   There is currently no information documented on the homunculus. Go to the Rheumatology activity and complete the homunculus joint exam.  Investigation: No additional findings.  Imaging: No results found.  Recent Labs: Lab Results  Component Value Date   WBC 7.2 04/27/2020   HGB 14.8 04/27/2020   PLT 230 04/27/2020   NA 138 04/27/2020   K 4.6 04/27/2020   CL 103 04/27/2020   CO2 29 04/27/2020   GLUCOSE 110 (H) 04/27/2020   BUN 12 04/27/2020   CREATININE 0.83 04/27/2020   BILITOT 0.8 04/27/2020   ALKPHOS 65 11/19/2016   AST 25 04/27/2020   ALT 27 04/27/2020   PROT 7.3 04/27/2020   ALBUMIN 4.4 11/19/2016   CALCIUM 9.4 04/27/2020   GFRAA 103 04/27/2020    Speciality Comments: Last Dexa Scan 03/21/2020  Prior therapy: Humira and Remicaide (inadequate response)  Procedures:  No procedures  performed Allergies: Patient has no known allergies.   Assessment / Plan:     Visit Diagnoses: Seronegative arthritis - inflammatory, erosive: He had no synovitis on my examination today.  He states he is doing better since he has been off Cimzia and been on methotrexate.  He started Stelara recently.  High risk medication use - methotrexate 0.38m subcutaneous once weekly. stelara infusions by GI.  - Plan: CBC with Differential/Platelet, COMPLETE METABOLIC PANEL WITH GFR today and then every 3 months to monitor for drug toxicity  Other ulcerative colitis with other complication (HCC)-his symptoms are better per patient.  He continues to have some diarrhea.  Idiopathic chronic gout of multiple sites without tophus - allopurinol 300 mg po daily and mitigare 0.6 mg po daily.  His uric acid has been stable and he has not had a gout flare in a long time.  Have advised him to take Medicare only on as needed basis.  Primary osteoarthritis of both hands-he has DIP and PIP thickening and some stiffness in his hands which persist.  Contracture of right elbow - Unchanged.  History of total bilateral knee replacement - Dr. LRonnie Derby He denies any knee joint discomfort.  History of bilateral rotator cuff tear repair-doing well.  Osteoporosis screening - DEXA 03/21/2020 T-score: -1.3, BMD: 0.756.  Use of calcium, vitamin D and resistive exercises was discussed.  History of gastroesophageal reflux (GERD)  Positive TB test - 2011.  Most recent chest x-ray was on 10/27/2019 which was stable with no active cardiopulmonary process.  COVID-19 virus infection - 07/01/2020  Educated about COVID-19 virus infection-he is fully vaccinated against COVID-19 but has not received his third and fourth dose (booster).  He is planning to get his third dose soon.  Use of mask, social distancing and hand hygiene was discussed.  Orders: Orders Placed This Encounter  Procedures  . CBC with Differential/Platelet  . COMPLETE  METABOLIC PANEL WITH GFR   No orders of the defined types were placed in this encounter.    Follow-Up Instructions: Return in about 5 months (around 01/11/2021) for UC, inflammatory arthritis.   SAbel Presto  Estanislado Pandy, MD  Note - This record has been created using Editor, commissioning.  Chart creation errors have been sought, but may not always  have been located. Such creation errors do not reflect on  the standard of medical care.

## 2020-08-03 ENCOUNTER — Telehealth: Payer: Self-pay

## 2020-08-03 NOTE — Telephone Encounter (Signed)
Yes  Dr. Estanislado Pandy, Juluis Rainier, starting [facility]

## 2020-08-03 NOTE — Telephone Encounter (Signed)
Pt has been approved for the [employer] pt assistance for Stelara injections through 07/31/2021. Do you want pt to be set up for Stelara infusion at the hospital now? Please advise.

## 2020-08-03 NOTE — Telephone Encounter (Signed)
Left message for pt to call back  °

## 2020-08-03 NOTE — Telephone Encounter (Signed)
Thank you for the update.  Christopher Smith

## 2020-08-04 ENCOUNTER — Other Ambulatory Visit: Payer: Self-pay

## 2020-08-04 DIAGNOSIS — K51918 Ulcerative colitis, unspecified with other complication: Secondary | ICD-10-CM

## 2020-08-04 NOTE — Telephone Encounter (Signed)
Pt scheduled for Stelara infusion at [facility] 08/10/20 at 9am. Orders in epic. Left message for pt to call back.

## 2020-08-07 NOTE — Telephone Encounter (Signed)
Spoke with pt and he is aware of appt.

## 2020-08-09 ENCOUNTER — Encounter (HOSPITAL_COMMUNITY): Payer: Self-pay | Admitting: *Deleted

## 2020-08-09 NOTE — Progress Notes (Addendum)
Patient was scheduled for stelara infusion on 08/10/2020. According to office notes the patient was supposed to go to the office to fill out patient assistance paperwork for the medication and he did not.  Otilio Saber, Network engineer, called patient on 08/09/2020 and left a message letting him know we had to cancel his appointment and that the medication was not ordered by the pharmacy because the assistace paperwork had not been completed. The secretary made a Second phone call at [phone] to patient, no answer, and not able to leave a voicemail.  Pharmacy also attempted to call patient and No answer again.

## 2020-08-10 ENCOUNTER — Inpatient Hospital Stay (HOSPITAL_COMMUNITY): Admission: RE | Admit: 2020-08-10 | Payer: Medicare Other | Source: Ambulatory Visit

## 2020-08-10 NOTE — Telephone Encounter (Signed)
Pt has been rescheduled for the Stelara infusion at [facility] short stay for 08/18/20@9am . Pt aware of appt.

## 2020-08-10 NOTE — Telephone Encounter (Signed)
[facility] called the pt and cancelled his stelara infusion as they did not have the drug. Stelara assistance for Injection approval form faxed to Mariann Laster at [facility] to [phone]. Will await to hear back regarding scheduling.

## 2020-08-10 NOTE — Telephone Encounter (Signed)
Pt is requesting a call back from a nurse in regards to his appt for Stelara Infusion that had to be cancelled due to lack of medication.

## 2020-08-14 ENCOUNTER — Encounter: Payer: Self-pay | Admitting: Rheumatology

## 2020-08-14 ENCOUNTER — Ambulatory Visit (INDEPENDENT_AMBULATORY_CARE_PROVIDER_SITE_OTHER): Payer: Medicare Other | Admitting: Rheumatology

## 2020-08-14 ENCOUNTER — Other Ambulatory Visit: Payer: Self-pay

## 2020-08-14 VITALS — BP 155/78 | HR 60 | Resp 15 | Ht 71.0 in | Wt 224.0 lb

## 2020-08-14 DIAGNOSIS — M1A09X0 Idiopathic chronic gout, multiple sites, without tophus (tophi): Secondary | ICD-10-CM

## 2020-08-14 DIAGNOSIS — M65341 Trigger finger, right ring finger: Secondary | ICD-10-CM

## 2020-08-14 DIAGNOSIS — U071 COVID-19: Secondary | ICD-10-CM | POA: Diagnosis not present

## 2020-08-14 DIAGNOSIS — Z8719 Personal history of other diseases of the digestive system: Secondary | ICD-10-CM

## 2020-08-14 DIAGNOSIS — R7611 Nonspecific reaction to tuberculin skin test without active tuberculosis: Secondary | ICD-10-CM

## 2020-08-14 DIAGNOSIS — M19041 Primary osteoarthritis, right hand: Secondary | ICD-10-CM

## 2020-08-14 DIAGNOSIS — Z96653 Presence of artificial knee joint, bilateral: Secondary | ICD-10-CM | POA: Diagnosis not present

## 2020-08-14 DIAGNOSIS — Z13820 Encounter for screening for osteoporosis: Secondary | ICD-10-CM | POA: Diagnosis not present

## 2020-08-14 DIAGNOSIS — K51818 Other ulcerative colitis with other complication: Secondary | ICD-10-CM | POA: Diagnosis not present

## 2020-08-14 DIAGNOSIS — Z79899 Other long term (current) drug therapy: Secondary | ICD-10-CM | POA: Diagnosis not present

## 2020-08-14 DIAGNOSIS — M12819 Other specific arthropathies, not elsewhere classified, unspecified shoulder: Secondary | ICD-10-CM | POA: Diagnosis not present

## 2020-08-14 DIAGNOSIS — M1380 Other specified arthritis, unspecified site: Secondary | ICD-10-CM | POA: Diagnosis not present

## 2020-08-14 DIAGNOSIS — Z7952 Long term (current) use of systemic steroids: Secondary | ICD-10-CM

## 2020-08-14 DIAGNOSIS — M24521 Contracture, right elbow: Secondary | ICD-10-CM | POA: Diagnosis not present

## 2020-08-14 DIAGNOSIS — M19042 Primary osteoarthritis, left hand: Secondary | ICD-10-CM

## 2020-08-14 DIAGNOSIS — Z7189 Other specified counseling: Secondary | ICD-10-CM

## 2020-08-14 NOTE — Progress Notes (Signed)
Medication Samples have been provided to the patient.  Drug name: mitigare       Strength: 0.98m        Qty: 4 bottles (7 caps per bottle)  LOT: [id number].Date: 09/11/2020  Dosing instructions: Take 1 capsule by mouth daily.   Patient is aware of expiration date and will take them prior to expiring.   MEarnestine Mealing10:38 AM 08/14/2020

## 2020-08-14 NOTE — Patient Instructions (Signed)
Standing Labs We placed an order today for your standing lab work.   Please have your standing labs drawn in April and every 3 months  If possible, please have your labs drawn 2 weeks prior to your appointment so that the provider can discuss your results at your appointment.  We have open lab daily Monday through Thursday from 8:30-12:30 PM and 1:30-4:30 PM and Friday from 8:30-12:30 PM and 1:30-4:00 PM at the office of Dr. Bo Merino, [facility].   Please be advised, all patients with office appointments requiring lab work will take precedents over walk-in lab work.  If possible, please come for your lab work on Monday and Friday afternoons, as you may experience shorter wait times. The office is located at [address], [place], [place], [place] [address] No appointment is necessary.   Labs are drawn by [facility]. Please bring your co-pay at the time of your lab draw.  You may receive a bill from [place] for your lab work.  If you wish to have your labs drawn at another location, please call the office 24 hours in advance to send orders.  If you have any questions regarding directions or hours of operation,  please call [phone].   As a reminder, please drink plenty of water prior to coming for your lab work. Thanks!    COVID-19 vaccine recommendations:   COVID-19 vaccine is recommended for everyone (unless you are allergic to a vaccine component), even if you are on a medication that suppresses your immune system.   If you are on Methotrexate, Cellcept (mycophenolate), Rinvoq, Morrie Sheldon, and Olumiant- hold the medication for 1 week after each vaccine. Hold Methotrexate for 2 weeks after the single dose COVID-19 vaccine.   Do not take Tylenol or any anti-inflammatory medications (NSAIDs) 24 hours prior to the COVID-19 vaccination.   Current recommendations for the immunocompromised patients is to get Covid vaccine first, second and third doses 1 month apart and  then booster (fourth dose)  6 months later.  There is no direct evidence about the efficacy of the COVID-19 vaccine in individuals who are on medications that suppress the immune system.   Even if you are fully vaccinated, and you are on any medications that suppress your immune system, please continue to wear a mask, maintain at least six feet social distance and practice hand hygiene.   If you develop a COVID-19 infection, please contact your PCP or our office to determine if you need monoclonal antibody infusion.  The booster vaccine is now available for immunocompromised patients.   Please see the following web sites for updated information.   [email or web address]

## 2020-08-15 LAB — CBC WITH DIFFERENTIAL/PLATELET
Absolute Monocytes: 703 cells/uL (ref 200–950)
Basophils Absolute: 32 cells/uL (ref 0–200)
Basophils Relative: 0.4 %
Eosinophils Absolute: 174 cells/uL (ref 15–500)
Eosinophils Relative: 2.2 %
HCT: 43.2 % (ref 38.5–50.0)
Hemoglobin: 14.9 g/dL (ref 13.2–17.1)
Lymphs Abs: 2259 cells/uL (ref 850–3900)
MCH: 32.5 pg (ref 27.0–33.0)
MCHC: 34.5 g/dL (ref 32.0–36.0)
MCV: 94.3 fL (ref 80.0–100.0)
MPV: 11.2 fL (ref 7.5–12.5)
Monocytes Relative: 8.9 %
Neutro Abs: 4732 cells/uL (ref 1500–7800)
Neutrophils Relative %: 59.9 %
Platelets: 242 10*3/uL (ref 140–400)
RBC: 4.58 10*6/uL (ref 4.20–5.80)
RDW: 14.2 % (ref 11.0–15.0)
Total Lymphocyte: 28.6 %
WBC: 7.9 10*3/uL (ref 3.8–10.8)

## 2020-08-15 LAB — COMPLETE METABOLIC PANEL WITH GFR
AG Ratio: 1.3 (calc) (ref 1.0–2.5)
ALT: 34 U/L (ref 9–46)
AST: 27 U/L (ref 10–35)
Albumin: 4.2 g/dL (ref 3.6–5.1)
Alkaline phosphatase (APISO): 45 U/L (ref 35–144)
BUN: 15 mg/dL (ref 7–25)
CO2: 28 mmol/L (ref 20–32)
Calcium: 9.5 mg/dL (ref 8.6–10.3)
Chloride: 104 mmol/L (ref 98–110)
Creat: 0.88 mg/dL (ref 0.70–1.18)
GFR, Est African American: 101 mL/min/{1.73_m2} (ref >=60)
GFR, Est Non African American: 87 mL/min/{1.73_m2} (ref >=60)
Globulin: 3.2 g/dL (calc) (ref 1.9–3.7)
Glucose, Bld: 102 mg/dL — ABNORMAL HIGH (ref 65–99)
Potassium: 4.7 mmol/L (ref 3.5–5.3)
Sodium: 140 mmol/L (ref 135–146)
Total Bilirubin: 0.8 mg/dL (ref 0.2–1.2)
Total Protein: 7.4 g/dL (ref 6.1–8.1)

## 2020-08-16 DIAGNOSIS — E119 Type 2 diabetes mellitus without complications: Secondary | ICD-10-CM | POA: Diagnosis not present

## 2020-08-16 DIAGNOSIS — I10 Essential (primary) hypertension: Secondary | ICD-10-CM | POA: Diagnosis not present

## 2020-08-16 DIAGNOSIS — M109 Gout, unspecified: Secondary | ICD-10-CM | POA: Diagnosis not present

## 2020-08-16 DIAGNOSIS — E785 Hyperlipidemia, unspecified: Secondary | ICD-10-CM | POA: Diagnosis not present

## 2020-08-18 ENCOUNTER — Other Ambulatory Visit: Payer: Self-pay

## 2020-08-18 ENCOUNTER — Ambulatory Visit (HOSPITAL_COMMUNITY)
Admission: RE | Admit: 2020-08-18 | Discharge: 2020-08-18 | Disposition: A | Discharge status: Home / Self Care | Payer: Medicare Other | Source: Ambulatory Visit | Attending: Internal Medicine | Admitting: Internal Medicine

## 2020-08-18 DIAGNOSIS — K51918 Ulcerative colitis, unspecified with other complication: Secondary | ICD-10-CM | POA: Diagnosis not present

## 2020-08-18 MED ORDER — USTEKINUMAB 130 MG/26ML IV SOLN
520.0000 mg | Freq: Once | INTRAVENOUS | Status: AC
  Administered 2020-08-18: 520 mg via INTRAVENOUS
  Filled 2020-08-18 (×4): qty 104

## 2020-08-18 NOTE — Discharge Instructions (Signed)
Ustekinumab injection What is this medicine? USTEKINUMAB ([place] te KIN ue mab) is used to treat plaque psoriasis and psoriatic arthritis. It is also used to treat Crohn's disease and ulcerative colitis. It is not a cure. This medicine may be used for other purposes; ask your health care provider or pharmacist if you have questions. COMMON BRAND NAME(S): Stelara What should I tell my health care provider before I take this medicine? They need to know if you have any of these conditions:  cancer  diabetes  history of skin cancer  immune system problems  infection (especially a virus infection such as chickenpox, cold sores, or herpes) or history of infections  new or changing lesions on your skin  receiving or have received allergy shots  receive or have received phototherapy for the skin  recently received or scheduled to receive a vaccine  tuberculosis, a positive skin test for tuberculosis, or have recently been in close contact with someone who has tuberculosis  an unusual reaction to ustekinumab, latex, other medicines, foods, dyes, or preservatives  pregnant or trying to get pregnant  breast-feeding How should I use this medicine? This medicine is for injection under the skin or infusion into a vein. It is usually given by a health care professional in a hospital or clinic setting. If you get this medicine at home, you will be taught how to prepare and give this medicine. Use exactly as directed. Take your medicine at regular intervals. Do not take your medicine more often than directed. It is important that you put your used needles and syringes in a special sharps container. Do not put them in a trash can. If you do not have a sharps container, call your pharmacist or healthcare provider to get one. A special MedGuide will be given to you by the pharmacist with each prescription and refill. Be sure to read this information carefully each time. Talk to your pediatrician  regarding the use of this medicine in children. While this drug may be prescribed for children as young as 6 years for selected conditions, precautions do apply. Overdosage: If you think you have taken too much of this medicine contact a poison control center or emergency room at once. NOTE: This medicine is only for you. Do not share this medicine with others. What if I miss a dose? If you miss a dose, take it as soon as you can. If it is almost time for your next dose, take only that dose. Do not take double or extra doses. What may interact with this medicine? Do not take this medicine with any of the following medications:  live virus vaccines This medicine may also interact with the following medications:  cyclosporine  biologic medicines such as abatacept, adalimumab, anakinra, certolizumab, etanercept, golimumab, infliximab, rituximab, secukinumab, tocilizumab  warfarin This list may not describe all possible interactions. Give your health care provider a list of all the medicines, herbs, non-prescription drugs, or dietary supplements you use. Also tell them if you smoke, drink alcohol, or use illegal drugs. Some items may interact with your medicine. What should I watch for while using this medicine? Your condition will be monitored carefully while you are receiving this medicine. Tell your doctor or healthcare professional if your symptoms do not start to get better or if they get worse. You will be tested for tuberculosis (TB) before you start this medicine. If your doctor prescribes any medicine for TB, you should start taking the TB medicine before starting this medicine. Make  sure to finish the full course of TB medicine. Call your doctor or health care professional if you get a cold or other infection while receiving this medicine. Do not treat yourself. This medicine may decrease your body's ability to fight infection. Talk to your doctor about your risk of cancer. You may be more  at risk for certain types of cancers if you take this medicine. What side effects may I notice from receiving this medicine? Side effects that you should report to your doctor or health care professional as soon as possible:  allergic reactions like skin rash, itching or hives, swelling of the face, lips, or tongue  breathing problems  changes in vision  confusion  headache  persistent headache  seizures  signs and symptoms of infection like fever or chills; cough; sore throat; pain or trouble passing urine  swollen lymph nodes in the neck, underarm, or groin areas  unexplained weight loss  unusually weak or tired Side effects that usually do not require medical attention (report to your doctor or health care professional if they continue or are bothersome):  diarrhea  nausea, vomiting  redness, itching, swelling, or bruising at site where injected  stomach pain  tiredness This list may not describe all possible side effects. Call your doctor for medical advice about side effects. You may report side effects to FDA at 1-800-FDA-1088. Where should I keep my medicine? Keep out of the reach of children. Store the prefilled syringes or vials in a refrigerator between 2 to 8 degrees C (36 to 46 degrees F). Keep in the original carton. Protect from light. Do not freeze. Do not shake. The vials should be stored upright. Throw away any unused medicine that has been stored in the refrigerator after the expiration date. If needed, a prefilled syringe may be stored at room temperature up to 30 degrees C (86 degrees F) for a maximum of 30 days. Keep it in the original carton to protect from light. Record the date when it is removed from the refrigerator on the carton. Once a syringe has been stored at room temperature, do not put it back in the refrigerator. Throw the syringe away after 30 days at room temperature, even if it still has medicine in it. NOTE: This sheet is a summary. It  may not cover all possible information. If you have questions about this medicine, talk to your doctor, pharmacist, or health care provider.  2021 Elsevier/Gold Standard (2019-06-29 09:14:15)

## 2020-08-21 DIAGNOSIS — R7303 Prediabetes: Secondary | ICD-10-CM | POA: Diagnosis not present

## 2020-08-21 DIAGNOSIS — I10 Essential (primary) hypertension: Secondary | ICD-10-CM | POA: Diagnosis not present

## 2020-08-21 DIAGNOSIS — E785 Hyperlipidemia, unspecified: Secondary | ICD-10-CM | POA: Diagnosis not present

## 2020-08-21 DIAGNOSIS — M109 Gout, unspecified: Secondary | ICD-10-CM | POA: Diagnosis not present

## 2020-08-21 DIAGNOSIS — K219 Gastro-esophageal reflux disease without esophagitis: Secondary | ICD-10-CM | POA: Diagnosis not present

## 2020-08-21 DIAGNOSIS — K5190 Ulcerative colitis, unspecified, without complications: Secondary | ICD-10-CM | POA: Diagnosis not present

## 2020-08-29 ENCOUNTER — Encounter: Payer: Self-pay | Admitting: Internal Medicine

## 2020-08-29 ENCOUNTER — Ambulatory Visit (INDEPENDENT_AMBULATORY_CARE_PROVIDER_SITE_OTHER): Payer: Medicare Other | Admitting: Internal Medicine

## 2020-08-29 VITALS — BP 126/78 | HR 67 | Ht 71.0 in | Wt 220.0 lb

## 2020-08-29 DIAGNOSIS — K219 Gastro-esophageal reflux disease without esophagitis: Secondary | ICD-10-CM

## 2020-08-29 DIAGNOSIS — K5100 Ulcerative (chronic) pancolitis without complications: Secondary | ICD-10-CM | POA: Diagnosis not present

## 2020-08-29 DIAGNOSIS — M0600 Rheumatoid arthritis without rheumatoid factor, unspecified site: Secondary | ICD-10-CM

## 2020-08-29 NOTE — Progress Notes (Signed)
Subjective:    Patient ID: Christopher Smith, male    DOB: March 19, 1951, 70 y.o.   MRN: [id number]  HPI Christopher Smith is a 70 year old male with a past medical history of longstanding pan ulcerative colitis (diagnosis 2011), seronegative rheumatoid arthritis, history of adenomatous colon polyps and GERD who is here for follow-up.  He is here alone today and was recently seen for surveillance colonoscopy.  Colonoscopy on 06/16/2020 revealed mild inflammation from the rectum to the distal sigmoid colon.  The remaining colon endoscopically appeared normal.  Biopsies throughout showed quiescsent colitis without dysplasia.  It was recently discovered that he had developed significant antibodies to Cimzia and thus Cimzia was stopped.  He was changed to [facility] and he is status post 1 Stelara infusion 2 weeks ago.  He reports that since stopping Cimzia his back pain has nearly resolved.  He is feeling very well.  He is still having his bilateral hand and wrist pain which he attributes to his rheumatoid arthritis.  From a bowel perspective he will have 4-5 bowel movements in the morning as it takes several bowel movements for him to feel completely empty.  Stools are mostly loose but without blood or melena.  He does have some mild lower abdominal pain at times but no tenesmus.  He will have bowel movements often 1 or 2 after dinner.  With the pantoprazole he is not having any heartburn if he does not take this for several days his heartburn returns.  No dysphagia or odynophagia.  No nausea or vomiting.  He has also continue methotrexate under the direction of Dr. Estanislado Smith.  Of note he has been previously treated with 8 months of isoniazid therapy for latent TB.  He gets annual chest x-rays usually with Dr. Estanislado Smith   Review of Systems As per HPI, otherwise negative  Current Medications, Allergies, Past Medical History, Past Surgical History, Family History and Social History were reviewed in [facility] record.     Objective:   Physical Exam BP 126/78   Pulse 67   Ht 5' 11"  (1.803 m)   Wt 220 lb (99.8 kg)   SpO2 99%   BMI 30.68 kg/m  Gen: awake, alert, NAD HEENT: anicteric  CV: RRR, no mrg Pulm: CTA b/l Abd: soft, NT/ND, +BS throughout Ext: no c/c/e Neuro: nonfocal  Lab work done by primary care dated 08/21/2020 Hemoglobin 14.9, MCV 92.6, white count 7.0, platelet count 231 CMP within normal limits, AST 30, ALT 38, alkaline phosphatase 55, total bilirubin 0.7, albumin 4.3 Hemoglobin A1c 6.1      Assessment & Plan:  70 year old male with a past medical history of longstanding pan ulcerative colitis (diagnosis 2011), seronegative rheumatoid arthritis, history of adenomatous colon polyps and GERD who is here for follow-up.  1. Pan ulcerative colitis (dx 2011) --he had developed antibodies to Cimzia which was also very likely contributing to some arthropathy though he also was treated for seronegative rheumatoid arthritis.  I am very hopeful that Stelara will continue to improve his colitis but also joint pains.  He did have some mild activity endoscopically in the distal sigmoid and rectum at recent surveillance endoscopy.  We will proceed as follows --Continue Stelara now with subcu injection every 8 weeks (Stelara started February 2022) --Continue methotrexate per Dr. Estanislado Smith --Surveillance colonoscopy in 2 years thus December 2023  2.  GERD --well-controlled on pantoprazole continue 40 mg daily  3.  Seronegative rheumatoid arthritis --new start Stelara as above, followed by  rheumatology  30 minutes total spent today including patient facing time, coordination of care, reviewing medical history/procedures/pertinent radiology studies, and documentation of the encounter.  Follow-up with me in 6 to 12 months

## 2020-08-29 NOTE — Patient Instructions (Signed)
Continue Stelara.  Continue Pantoprazole.  Please follow up with Dr Hilarie Fredrickson in 6 months.  If you are age 71 or older, your body mass index should be between 23-30. Your Body mass index is 30.68 kg/m. If this is out of the aforementioned range listed, please consider follow up with your Primary Care Provider.  If you are age 97 or younger, your body mass index should be between 19-25. Your Body mass index is 30.68 kg/m. If this is out of the aformentioned range listed, please consider follow up with your Primary Care Provider.   Due to recent changes in healthcare laws, you may see the results of your imaging and laboratory studies on MyChart before your provider has had a chance to review them.  We understand that in some cases there may be results that are confusing or concerning to you. Not all laboratory results come back in the same time frame and the provider may be waiting for multiple results in order to interpret others.  Please give [place] 48 hours in order for your provider to thoroughly review all the results before contacting the office for clarification of your results.

## 2020-09-20 ENCOUNTER — Other Ambulatory Visit: Payer: Self-pay | Admitting: Rheumatology

## 2020-09-20 DIAGNOSIS — M1A09X0 Idiopathic chronic gout, multiple sites, without tophus (tophi): Secondary | ICD-10-CM

## 2020-09-20 NOTE — Telephone Encounter (Signed)
Last Visit: 08/14/2020 Next Visit: 01/08/2021 Labs: 131/2022, CBC WNL. Glucose is 102. Rest of CMP WNL. Uric Acid 04/27/2020, Uric acid is within the desirable range.   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

## 2020-09-26 ENCOUNTER — Other Ambulatory Visit: Payer: Self-pay | Admitting: Internal Medicine

## 2020-09-26 DIAGNOSIS — K219 Gastro-esophageal reflux disease without esophagitis: Secondary | ICD-10-CM

## 2020-10-13 IMAGING — CR DG CHEST 2V
2 series · 2 of 2 positions shown · non-contrast
Comparison: Radiographs 09/18/2018. CT 08/06/2005.

CLINICAL DATA: Positive PPD. On immunosuppressive therapy.

EXAM:
CHEST - 2 VIEW

[chest pa]
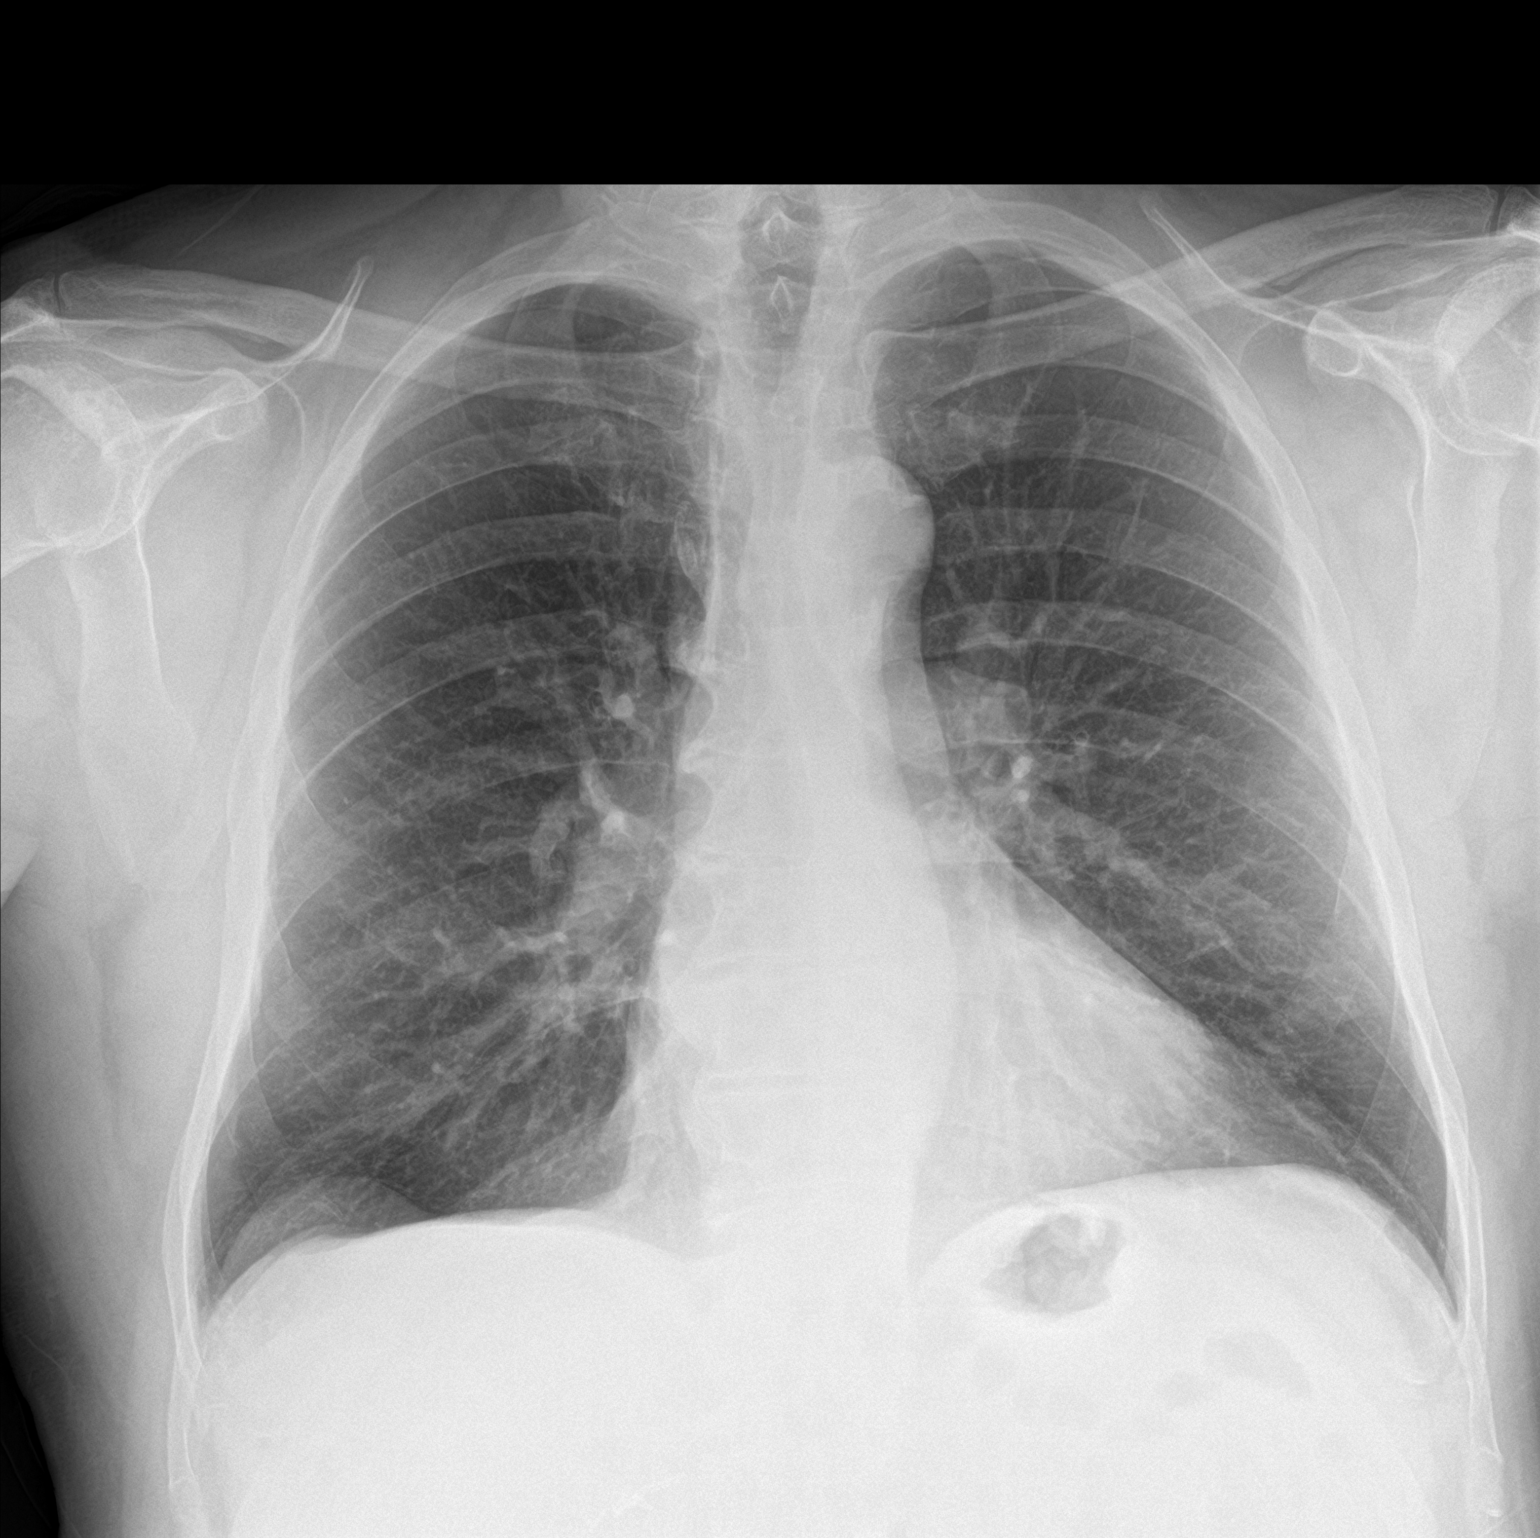

[chest lat]
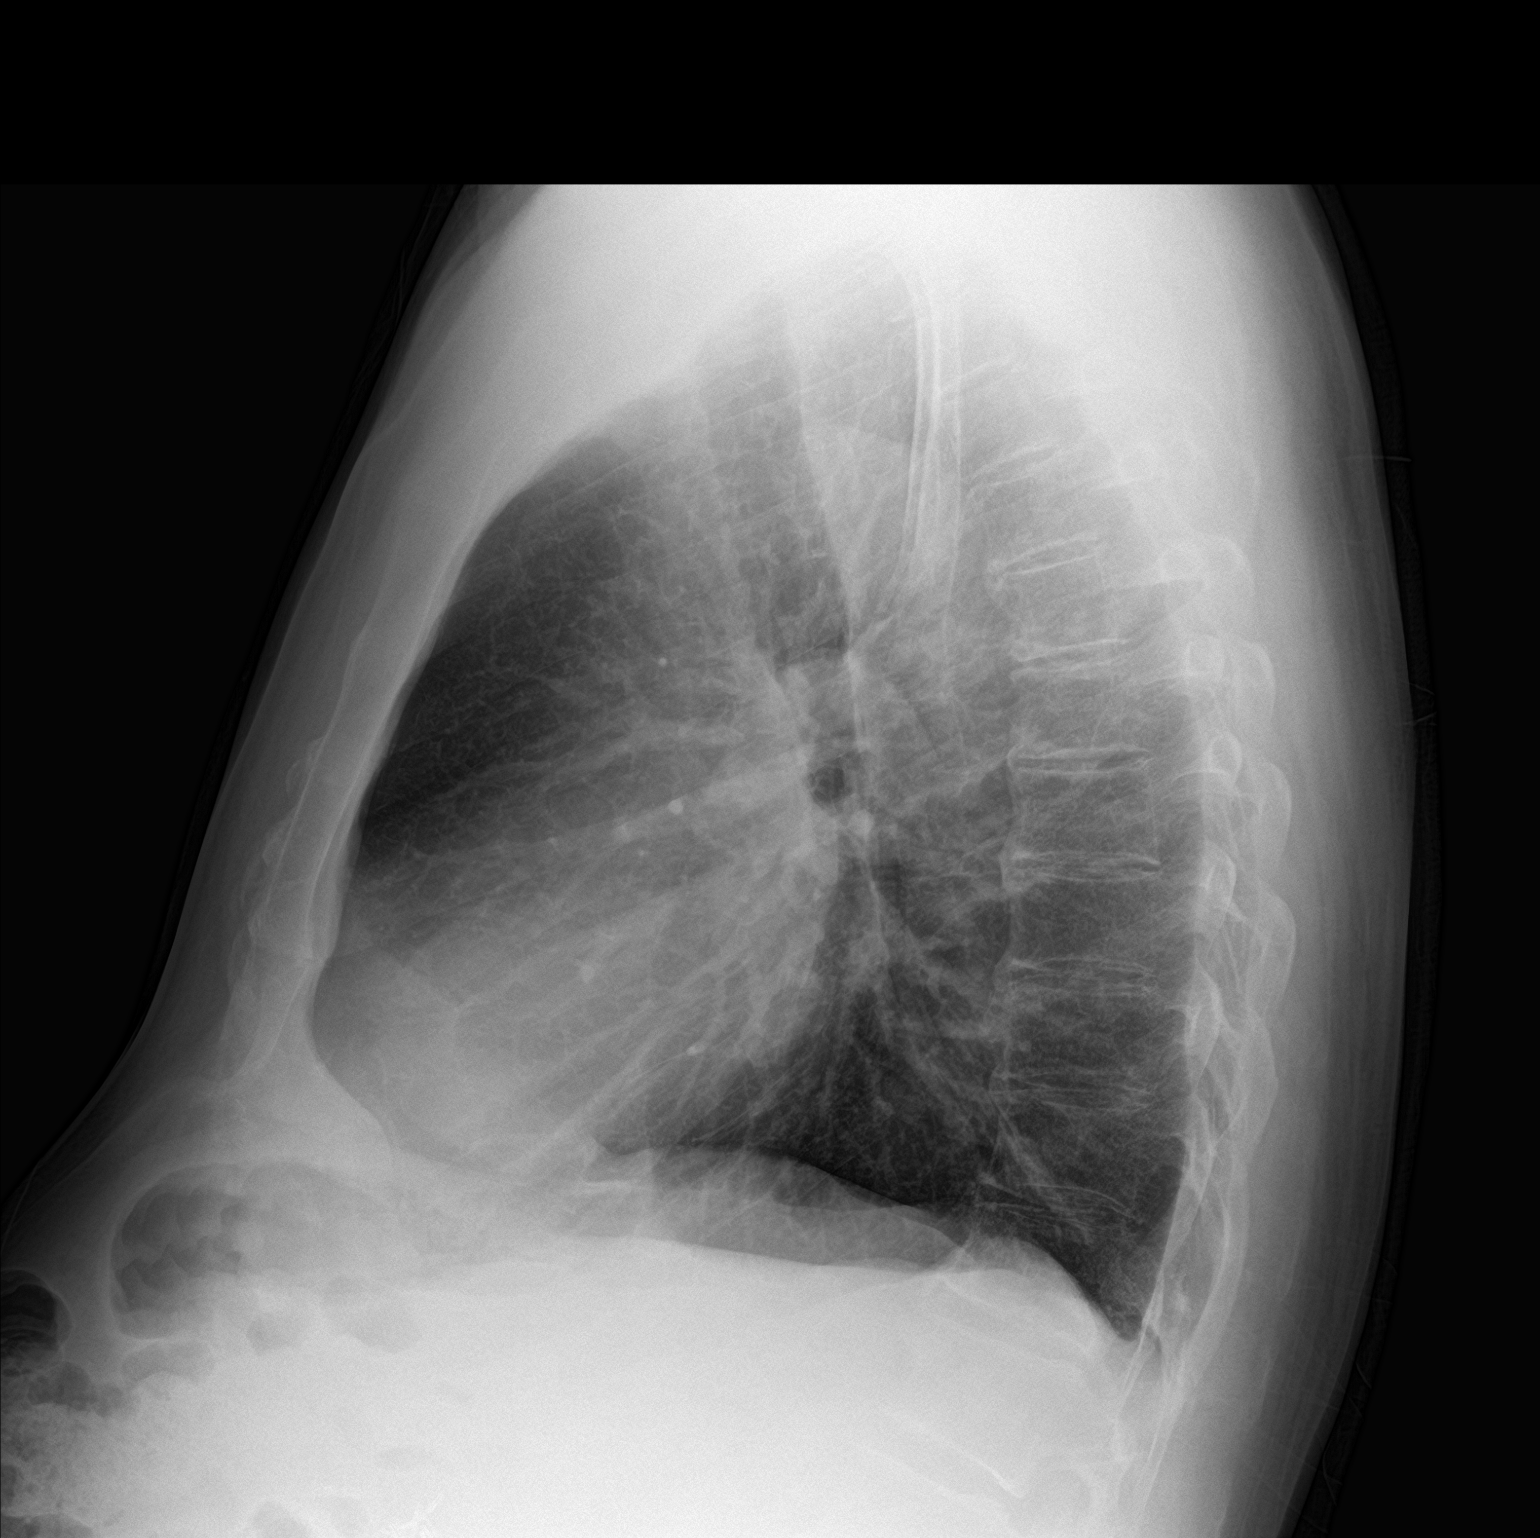

[2 of 2 positions shown; findings below may reference images not displayed]

FINDINGS: The heart size and mediastinal contours are stable. The lungs are
clear. There is no pleural effusion or pneumothorax. The bones
appear unchanged.
IMPRESSION: Stable chest. No active cardiopulmonary process.

## 2020-11-06 ENCOUNTER — Other Ambulatory Visit: Payer: Self-pay | Admitting: Physician Assistant

## 2020-11-06 NOTE — Telephone Encounter (Signed)
Next Visit: 01/08/2021  Last Visit: 08/14/2020  Last Fill: 02/29/2020  DX: Seronegative arthritis   Current Dose per office note 08/14/2020, methotrexate 0.31m subcutaneous once weekly   Labs: 08/14/2020, CBC WNL. Glucose is 102. Rest of CMP WNL.   Okay to refill MTX?

## 2020-12-21 ENCOUNTER — Other Ambulatory Visit: Payer: Self-pay | Admitting: Physician Assistant

## 2020-12-21 NOTE — Telephone Encounter (Signed)
Last Visit: 08/14/2020 Next Visit: 01/08/2021 Labs: 08/14/2020, CBC WNL.  Glucose is 102. Rest of CMP WNL. Uric Acid 04/27/2020, Uric acid is within the desirable range.    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

## 2020-12-25 NOTE — Progress Notes (Deleted)
Office Visit Note  Patient: Christopher Smith             Date of Birth: 1951/06/30           MRN: [id number]             PCP: Janie Morning, DO Referring: No ref. provider found Visit Date: 01/08/2021 Occupation: @GUAROCC @  Subjective:  No chief complaint on file.   History of Present Illness: Christopher Smith is a 70 y.o. male ***   Activities of Daily Living:  Patient reports morning stiffness for *** {minute/hour:19697}.   Patient {ACTIONS;DENIES/REPORTS:21021675::"Denies"} nocturnal pain.  Difficulty dressing/grooming: {ACTIONS;DENIES/REPORTS:21021675::"Denies"} Difficulty climbing stairs: {ACTIONS;DENIES/REPORTS:21021675::"Denies"} Difficulty getting out of chair: {ACTIONS;DENIES/REPORTS:21021675::"Denies"} Difficulty using hands for taps, buttons, cutlery, and/or writing: {ACTIONS;DENIES/REPORTS:21021675::"Denies"}  No Rheumatology ROS completed.   PMFS History:  Patient Active Problem List   Diagnosis Date Noted  . Open fracture of one or more phalanges of hand 11/21/2017  . Chronic inflammatory arthritis 08/05/2016  . High risk medication use 08/05/2016  . Contracture of elbow, right 08/05/2016  . History of total knee replacement, bilateral 08/05/2016  . 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 History of bilateral rotator cuff tear repair 08/05/2016  . Gastroesophageal reflux  08/05/2016  . Latent tuberculosis by blood test 08/05/2016  . Ulcerative colitis 09/03/2011  . Glucose intolerance (impaired glucose tolerance) 09/03/2011  . Severe diarrhea 09/03/2011  . URI (upper respiratory infection) 07/02/2011  . Bile salt-induced diarrhea 07/02/2011  . S/P cholecystectomy 07/02/2011  . Ulcerative colitis, unspecified 04/03/2011  . Benign neoplasm of colon 04/03/2011  . Positive TB test 03/22/2011  . Insomnia due to medical condition 03/22/2011  . UC (ulcerative colitis) ([place]) 03/01/2011  . Leg cramps 03/01/2011  . Ulcerative colitis with  rectal bleeding ([place]) 01/24/2011  . Nonspecific abnormal finding in stool contents 01/24/2011  . Heme positive stool 01/11/2011  . Diarrhea 01/11/2011  . Arthritis associated with inflammatory bowel disease 01/11/2011  . Intentional weight loss 01/11/2011    Past Medical History:  Diagnosis Date  . Allergy   . Anal fissure   . Arthritis    RA  . Blood transfusion without reported diagnosis   . COVID-19 06/2020  . Diabetes mellitus    no meds taken 05-01-16- elevated glucose was from [facility] for UC  . Diarrhea   . Fatty liver   . GERD (gastroesophageal reflux disease)   . Gout   . Hiatal hernia   . Hypertension   . Internal hemorrhoids   . Polyarthritis   . PONV (postoperative nausea and vomiting)   . Sessile rectal polyp 03/27/2011  . Sinusitis   . Tuberculosis    positive PPD- had an allergic reaction to the PPD, gets yearly chest xray  . Tubular adenoma of colon   . Ulcerative colitis 01/24/2011    Family History  Problem Relation Age of Onset  . Alzheimer's disease Father   . Stroke Mother   . Cancer Mother        liver & breast   . Breast cancer Mother   . Liver cancer Mother   . Rheum arthritis Brother   . Rheum arthritis Brother   . Rheum arthritis Sister   . Rheum arthritis Sister   . Breast cancer Sister   . Rheum arthritis Sister   . Breast cancer Sister   . Colon cancer Neg Hx   . Esophageal cancer Neg Hx   . Stomach cancer Neg Hx   . Rectal  cancer Neg Hx   . Colon polyps Neg Hx    Past Surgical History:  Procedure Laterality Date  . CHOLECYSTECTOMY    . COLONOSCOPY    . KNEE SURGERY     Bilateral   . POLYPECTOMY    . SHOULDER SURGERY Bilateral    Bilateral - rotator cuff repair   . TOTAL KNEE ARTHROPLASTY Bilateral    Social History   Social History Narrative   1 caffeine drink daily    Immunization History  Administered Date(s) Administered  . Influenza, High Dose Seasonal PF 04/28/2019  . Moderna Sars-Covid-2 Vaccination  09/08/2019, 10/06/2019  . Tdap 11/19/2017     Objective: Vital Signs: There were no vitals taken for this visit.   Physical Exam   Musculoskeletal Exam: ***  CDAI Exam: CDAI Score: -- Patient Global: --; Provider Global: -- Swollen: --; Tender: -- Joint Exam 01/08/2021   No joint exam has been documented for this visit   There is currently no information documented on the homunculus. Go to the Rheumatology activity and complete the homunculus joint exam.  Investigation: No additional findings.  Imaging: No results found.  Recent Labs: Lab Results  Component Value Date   WBC 7.9 08/14/2020   HGB 14.9 08/14/2020   PLT 242 08/14/2020   NA 140 08/14/2020   K 4.7 08/14/2020   CL 104 08/14/2020   CO2 28 08/14/2020   GLUCOSE 102 (H) 08/14/2020   BUN 15 08/14/2020   CREATININE 0.88 08/14/2020   BILITOT 0.8 08/14/2020   ALKPHOS 65 11/19/2016   AST 27 08/14/2020   ALT 34 08/14/2020   PROT 7.4 08/14/2020   ALBUMIN 4.4 11/19/2016   CALCIUM 9.5 08/14/2020   GFRAA 101 08/14/2020    Speciality Comments: Last Dexa Scan 03/21/2020  Prior therapy: Humira and Remicaide (inadequate response)  Procedures:  No procedures performed Allergies: Patient has no known allergies.   Assessment / Plan:     Visit Diagnoses: No diagnosis found.  Orders: No orders of the defined types were placed in this encounter.  No orders of the defined types were placed in this encounter.   Face-to-face time spent with patient was *** minutes. Greater than 50% of time was spent in counseling and coordination of care.  Follow-Up Instructions: No follow-ups on file.   Earnestine Mealing, CMA  Note - This record has been created using Editor, commissioning.  Chart creation errors have been sought, but may not always  have been located. Such creation errors do not reflect on  the standard of medical care.

## 2021-01-08 ENCOUNTER — Ambulatory Visit: Payer: Medicare Other | Admitting: Rheumatology

## 2021-01-10 NOTE — Progress Notes (Signed)
Office Visit Note  Patient: Christopher Smith             Date of Birth: 1951-05-08           MRN: [id number]             PCP: Janie Morning, DO Referring: Janie Morning, DO Visit Date: 01/16/2021 Occupation: @GUAROCC @  Subjective:  Medication monitoring   History of Present Illness: Christopher Smith is a 70 y.o. male with history of seronegative rheumatoid arthritis, osteoarthritis, gout, and ulcerative colitis.  Patient is on methotrexate 0.6 mL sq injections once weekly, folic acid 2 mg by mouth daily, and receives Stelara IV infusions by GI.  He denies any recent ulcerative colitis flares.  He has not required a prednisone taper recently.  He states he continues to have intermittent pain and stiffness in the right 2nd MCP and PIP joints.  He denies any joint swelling at this time. He states he has had a few falls related to his episodic vertigo.  He denies any fractures.  He has noticed some fluid in the left knee which is replaced.  He attributes the fluid to performing strenuous activities recently.  He has not scheduled a follow up with Dr. Ronnie Derby yet since his symptoms have gradually been improving.  He denies any recent gout flares.  He is taking allopurinol 300 mg 1 tablet by mouth daily for management of gout. He discontinued mitigare after his last office visit and has not had any signs of a flare. He denies any recent infections.    Activities of Daily Living:  Patient reports morning stiffness for 10 minutes.   Patient Denies nocturnal pain.  Difficulty dressing/grooming: Denies Difficulty climbing stairs: Denies Difficulty getting out of chair: Denies Difficulty using hands for taps, buttons, cutlery, and/or writing: Reports  Review of Systems  Constitutional:  Negative for fatigue.  HENT:  Positive for mouth dryness and nose dryness. Negative for mouth sores.   Eyes:  Positive for pain, itching and dryness.  Respiratory:  Negative for shortness of breath and difficulty  breathing.   Cardiovascular:  Negative for chest pain and palpitations.  Gastrointestinal:  Positive for diarrhea. Negative for blood in stool and constipation.  Endocrine: Negative for increased urination.  Genitourinary:  Negative for difficulty urinating.  Musculoskeletal:  Positive for joint pain, joint pain, joint swelling and morning stiffness. Negative for myalgias, muscle tenderness and myalgias.  Skin:  Negative for color change, rash and redness.  Allergic/Immunologic: Negative for susceptible to infections.  Neurological:  Positive for dizziness. Negative for numbness, headaches and weakness.  Hematological:  Negative for bruising/bleeding tendency.  Psychiatric/Behavioral:  Negative for confusion.    PMFS History:  Patient Active Problem List   Diagnosis Date Noted   Open fracture of one or more phalanges of hand 11/21/2017   Chronic inflammatory arthritis 08/05/2016   High risk medication use 08/05/2016   Contracture of elbow, right 08/05/2016   History of total knee replacement, bilateral 08/05/2016   Idiopathic chronic gout 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 tophus 08/05/2016   History of bilateral rotator cuff tear repair 08/05/2016   Gastroesophageal reflux  08/05/2016   Latent tuberculosis by blood test 08/05/2016   Ulcerative colitis 09/03/2011   Glucose intolerance (impaired glucose tolerance) 09/03/2011   Severe diarrhea 09/03/2011   URI (upper respiratory infection) 07/02/2011   Bile salt-induced diarrhea 07/02/2011   S/P cholecystectomy 07/02/2011   Ulcerative colitis, unspecified 04/03/2011   Benign neoplasm of colon 04/03/2011   Positive TB  test 03/22/2011   Insomnia due to medical condition 03/22/2011   UC (ulcerative colitis) ([place]) 03/01/2011   Leg cramps 03/01/2011   Ulcerative colitis with rectal bleeding ([place]) 01/24/2011   Nonspecific abnormal finding in stool contents 01/24/2011   Heme positive stool 01/11/2011   Diarrhea 01/11/2011   Arthritis associated with  inflammatory bowel disease 01/11/2011   Intentional weight loss 01/11/2011    Past Medical History:  Diagnosis Date   Allergy    Anal fissure    Arthritis    RA   Blood transfusion without reported diagnosis    COVID-19 06/2020   Diabetes mellitus    no meds taken 05-01-16- elevated glucose was from predisone for UC   Diarrhea    Fatty liver    GERD (gastroesophageal reflux disease)    Gout    Hiatal hernia    Hypertension    Internal hemorrhoids    Polyarthritis    PONV (postoperative nausea and vomiting)    Sessile rectal polyp 03/27/2011   Sinusitis    Tuberculosis    positive PPD- had an allergic reaction to the PPD, gets yearly chest xray   Tubular adenoma of colon    Ulcerative colitis 01/24/2011    Family History  Problem Relation Age of Onset   Alzheimer's disease Father    Stroke Mother    Cancer Mother        liver & breast    Breast cancer Mother    Liver cancer Mother    Rheum arthritis Brother    Rheum arthritis Brother    Rheum arthritis Sister    Rheum arthritis Sister    Breast cancer Sister    Rheum arthritis Sister    Breast cancer Sister    Colon cancer Neg Hx    Esophageal cancer Neg Hx    Stomach cancer Neg Hx    Rectal cancer Neg Hx    Colon polyps Neg Hx    Past Surgical History:  Procedure Laterality Date   CHOLECYSTECTOMY     COLONOSCOPY     KNEE SURGERY     Bilateral    POLYPECTOMY     SHOULDER SURGERY Bilateral    Bilateral - rotator cuff repair    TOTAL KNEE ARTHROPLASTY Bilateral    Social History   Social History Narrative   1 caffeine drink daily    Immunization History  Administered Date(s) Administered   Influenza, High Dose Seasonal PF 04/28/2019   Moderna Sars-Covid-2 Vaccination 09/08/2019, 10/06/2019   Tdap 11/19/2017     Objective: Vital Signs: BP 130/78 (BP Location: Left Arm, Patient Position: Sitting, Cuff Size: Normal)   Pulse 66   Resp 15   Ht 5' 11"  (1.803 m)   Wt 221 lb 6.4 oz (100.4 kg)   BMI  30.88 kg/m    Physical Exam Vitals and nursing note reviewed.  Constitutional:      Appearance: He is well-developed.  HENT:     Head: Normocephalic and atraumatic.  Eyes:     Conjunctiva/sclera: Conjunctivae normal.     Pupils: Pupils are equal, round, and reactive to light.  Pulmonary:     Effort: Pulmonary effort is normal.  Abdominal:     Palpations: Abdomen is soft.  Musculoskeletal:     Cervical back: Normal range of motion and neck supple.  Skin:    General: Skin is warm and dry.     Capillary Refill: Capillary refill takes less than 2 seconds.  Neurological:  Mental Status: He is alert and oriented to person, place, and time.  Psychiatric:        Behavior: Behavior normal.     Musculoskeletal Exam: C-spine, thoracic spine, and lumbar spine good ROM.  No midline spinal tenderness.  No SI joint tenderness.  Shoulder joint crepitus with ROM.  Right elbow joint contracture.  Wrist joints good ROM with no tenderness or synovitis.  Synovial thickening of MCPs but no synovitis.  Tenderness of the right 2nd MCP and PIP joint. PIP and DIP thickening consistent with osteoarthritis of both hands.  Hip joints good ROM with no discomfort.  Left knee replacement effusion noted.  Right knee has good ROM with no warmth or effusion.  Ankle joints good ROM with no tenderness or joint swelling.   CDAI Exam: CDAI Score: 3  Patient Global: 5 mm; Provider Global: 5 mm Swollen: 0 ; Tender: 2  Joint Exam 01/16/2021      Right  Left  MCP 2   Tender     PIP 2   Tender        Investigation: No additional findings.  Imaging: No results found.  Recent Labs: Lab Results  Component Value Date   WBC 7.9 08/14/2020   HGB 14.9 08/14/2020   PLT 242 08/14/2020   NA 140 08/14/2020   K 4.7 08/14/2020   CL 104 08/14/2020   CO2 28 08/14/2020   GLUCOSE 102 (H) 08/14/2020   BUN 15 08/14/2020   CREATININE 0.88 08/14/2020   BILITOT 0.8 08/14/2020   ALKPHOS 65 11/19/2016   AST 27  08/14/2020   ALT 34 08/14/2020   PROT 7.4 08/14/2020   ALBUMIN 4.4 11/19/2016   CALCIUM 9.5 08/14/2020   GFRAA 101 08/14/2020    Speciality Comments: Last Dexa Scan 03/21/2020  Prior therapy: Humira and Remicaide (inadequate response)  Procedures:  No procedures performed Allergies: Patient has no known allergies.   Assessment / Plan:     Visit Diagnoses: Seronegative arthritis - Inflammatory, erosive: He has tenderness to palpation over the right second MCP and PIP joint but no synovitis was noted.  Thickening of PIP and DIP joints consistent with osteoarthritis of both hands was apparent.  Overall he has clinically been doing well on methotrexate 0.6 mL sq injections once weekly and folic acid 1 mg by mouth daily.  During the fall and winter he experiences severe pain and stiffness in multiple joints despite being on the current treatment regimen.  He cannot take NSAIDs due to history of UC and MTX use.  He has difficulty performing ADLs during these episodes of generalized pain.  He states the pain in his hands is most severe if he hits them on something but occasionally he has unprovoked joint tenderness.  He declined a referral to pain management at this time.  He will remain on methotrexate and folic acid as prescribed.  He does not need any refills at this time.  He was advised to notify us if he develops increased joint pain or joint swelling.  He will follow up in the office in 5 months.   High risk medication use - Methotrexate 0.6 ml subcutaneous injections once weekly, folic acid 1 mg daily, and Stelara IV infusions prescribed by GI. CBC and CMP updated on 08/16/20 were WNL.  He is due to update lab work.  Orders for CBC and CMP were released. - Plan: CBC with Differential/Platelet, COMPLETE METABOLIC PANEL WITH GFR He has not had any recent infections.  Advised to  hold MTX and stelara if he develops signs or symptoms of an infection and to resume once the infection has completely  cleared.   Positive TB test - 2011.  Most recent chest x-ray was on 10/27/2019 which was stable with no active cardiopulmonary process. Future order for CXR placed today.  - Plan: DG Chest 2 View  Other ulcerative colitis with other complication [facility]): He has not had any signs or symptoms of a flare recently.  He has not require a prednisone taper since his last office visit.  He continues to follow up with Dr. Hilarie Fredrickson.  He will remain on IV stelara as prescribed and methotrexate 0.6 ml sq injections once weekly.   Idiopathic chronic gout of multiple sites without tophus -  He has not had any recent gout flares.  He takes allopurinol 300 mg 1 tablet by mouth daily for management of gout.  He is no longer taking colchicine due to clinically doing well on allopurinol.  His uric acid was 5.2, which is within the desirable range, on 08/17/2020.  We will recheck uric acid level today.  He will remain on allopurinol as prescribed.    Plan: Uric acid  Primary osteoarthritis of both hands: He has PIP and DIP thickening consistent with osteoarthritis of both hands.  CMC joint prominence noted bilaterally.  He has difficulty making a complete fist due to stiffness of PIP joints.  Tenderness of the right 2nd MCP and PIP joints but no synovitis noted.  Discussed the importance of joint protection and muscle strengthening.  He was given a handout of hand exercises to perform.    Contracture of right elbow - Unchanged.  History of total bilateral knee replacement - Dr. Ronnie Derby:  He was recently performing a strenuous activity and has noticed included stiffness and fluid in the left knee replacement.  His discomfort has gradually been improving. Discussed the use of a left knee compression sleeve or knee brace.  He was advised to schedule an appointment with Dr. Ronnie Derby for further evaluation if his discomfort persists or worsens.   History of bilateral rotator cuff tear repair: He has stiffness and crepitus in both  shoulders with ROM.    Osteoporosis screening - DEXA 03/21/20: LFN BMD 0.756 with T-score -1.3.  Repeat DEXA due in September 2023.  No recent fractures.   Other medical conditions are listed as follows:   History of gastroesophageal reflux (GERD)  COVID-19 virus infection - 07/01/2020  Orders: Orders Placed This Encounter  Procedures   DG Chest 2 View   CBC with Differential/Platelet   COMPLETE METABOLIC PANEL WITH GFR   Uric acid   No orders of the defined types were placed in this encounter.     Follow-Up Instructions: Return in about 5 months (around 06/18/2021) for Rheumatoid arthritis, Osteoarthritis, Gout, UC.   Ofilia Neas, PA-C  Note - This record has been created using Dragon software.  Chart creation errors have been sought, but may not always  have been located. Such creation errors do not reflect on  the standard of medical care.

## 2021-01-16 ENCOUNTER — Encounter: Payer: Self-pay | Admitting: Physician Assistant

## 2021-01-16 ENCOUNTER — Other Ambulatory Visit: Payer: Self-pay

## 2021-01-16 ENCOUNTER — Ambulatory Visit (INDEPENDENT_AMBULATORY_CARE_PROVIDER_SITE_OTHER): Payer: Medicare Other | Admitting: Physician Assistant

## 2021-01-16 VITALS — BP 130/78 | HR 66 | Resp 15 | Ht 71.0 in | Wt 221.4 lb

## 2021-01-16 DIAGNOSIS — Z8719 Personal history of other diseases of the digestive system: Secondary | ICD-10-CM

## 2021-01-16 DIAGNOSIS — M19041 Primary osteoarthritis, right hand: Secondary | ICD-10-CM | POA: Diagnosis not present

## 2021-01-16 DIAGNOSIS — R7611 Nonspecific reaction to tuberculin skin test without active tuberculosis: Secondary | ICD-10-CM

## 2021-01-16 DIAGNOSIS — Z96653 Presence of artificial knee joint, bilateral: Secondary | ICD-10-CM | POA: Diagnosis not present

## 2021-01-16 DIAGNOSIS — K51818 Other ulcerative colitis with other complication: Secondary | ICD-10-CM | POA: Diagnosis not present

## 2021-01-16 DIAGNOSIS — M1A09X0 Idiopathic chronic gout, multiple sites, without tophus (tophi): Secondary | ICD-10-CM

## 2021-01-16 DIAGNOSIS — M24521 Contracture, right elbow: Secondary | ICD-10-CM | POA: Diagnosis not present

## 2021-01-16 DIAGNOSIS — U071 COVID-19: Secondary | ICD-10-CM | POA: Diagnosis not present

## 2021-01-16 DIAGNOSIS — M19042 Primary osteoarthritis, left hand: Secondary | ICD-10-CM

## 2021-01-16 DIAGNOSIS — Z13820 Encounter for screening for osteoporosis: Secondary | ICD-10-CM

## 2021-01-16 DIAGNOSIS — M1380 Other specified arthritis, unspecified site: Secondary | ICD-10-CM

## 2021-01-16 DIAGNOSIS — M12819 Other specific arthropathies, not elsewhere classified, unspecified shoulder: Secondary | ICD-10-CM

## 2021-01-16 DIAGNOSIS — Z79899 Other long term (current) drug therapy: Secondary | ICD-10-CM | POA: Diagnosis not present

## 2021-01-16 NOTE — Patient Instructions (Signed)
Hand Exercises Hand exercises can be helpful for almost anyone. These exercises can strengthen the hands, improve flexibility and movement, and increase blood flow to the hands. These results can make work and daily tasks easier. Hand exercises can be especially helpful for people who have joint pain from arthritis or have nerve damage from overuse (carpal tunnel syndrome). These exercises can also help people who have injured a hand. Exercises Most of these hand exercises are gentle stretching and motion exercises. It is usually safe to do them often throughout the day. Warming up your hands before exercise may help to reduce stiffness. You can do this with gentle massage orby placing your hands in warm water for 10-15 minutes. It is normal to feel some stretching, pulling, tightness, or mild discomfort as you begin new exercises. This will gradually improve. Stop an exercise right away if you feel sudden, severe pain or your pain gets worse. Ask your healthcare provider which exercises are best for you. Knuckle bend or "claw" fist Stand or sit with your arm, hand, and all five fingers pointed straight up. Make sure to keep your wrist straight during the exercise. Gently bend your fingers down toward your palm until the tips of your fingers are touching the top of your palm. Keep your big knuckle straight and just bend the small knuckles in your fingers. Hold this position for __________ seconds. Straighten (extend) your fingers back to the starting position. Repeat this exercise 5-10 times with each hand. Full finger fist Stand or sit with your arm, hand, and all five fingers pointed straight up. Make sure to keep your wrist straight during the exercise. Gently bend your fingers into your palm until the tips of your fingers are touching the middle of your palm. Hold this position for __________ seconds. Extend your fingers back to the starting position, stretching every joint fully. Repeat this  exercise 5-10 times with each hand. Straight fist Stand or sit with your arm, hand, and all five fingers pointed straight up. Make sure to keep your wrist straight during the exercise. Gently bend your fingers at the big knuckle, where your fingers meet your hand, and the middle knuckle. Keep the knuckle at the tips of your fingers straight and try to touch the bottom of your palm. Hold this position for __________ seconds. Extend your fingers back to the starting position, stretching every joint fully. Repeat this exercise 5-10 times with each hand. Tabletop Stand or sit with your arm, hand, and all five fingers pointed straight up. Make sure to keep your wrist straight during the exercise. Gently bend your fingers at the big knuckle, where your fingers meet your hand, as far down as you can while keeping the small knuckles in your fingers straight. Think of forming a tabletop with your fingers. Hold this position for __________ seconds. Extend your fingers back to the starting position, stretching every joint fully. Repeat this exercise 5-10 times with each hand. Finger spread Place your hand flat on a table with your palm facing down. Make sure your wrist stays straight as you do this exercise. Spread your fingers and thumb apart from each other as far as you can until you feel a gentle stretch. Hold this position for __________ seconds. Bring your fingers and thumb tight together again. Hold this position for __________ seconds. Repeat this exercise 5-10 times with each hand. Making circles Stand or sit with your arm, hand, and all five fingers pointed straight up. Make sure to keep your  wrist straight during the exercise. Make a circle by touching the tip of your thumb to the tip of your index finger. Hold for __________ seconds. Then open your hand wide. Repeat this motion with your thumb and each finger on your hand. Repeat this exercise 5-10 times with each hand. Thumb motion Sit  with your forearm resting on a table and your wrist straight. Your thumb should be facing up toward the ceiling. Keep your fingers relaxed as you move your thumb. Lift your thumb up as high as you can toward the ceiling. Hold for __________ seconds. Bend your thumb across your palm as far as you can, reaching the tip of your thumb for the small finger (pinkie) side of your palm. Hold for __________ seconds. Repeat this exercise 5-10 times with each hand. Grip strengthening  Hold a stress ball or other soft ball in the middle of your hand. Slowly increase the pressure, squeezing the ball as much as you can without causing pain. Think of bringing the tips of your fingers into the middle of your palm. All of your finger joints should bend when doing this exercise. Hold your squeeze for __________ seconds, then relax. Repeat this exercise 5-10 times with each hand. Contact a health care provider if: Your hand pain or discomfort gets much worse when you do an exercise. Your hand pain or discomfort does not improve within 2 hours after you exercise. If you have any of these problems, stop doing these exercises right away. Do not do them again unless your health care provider says that you can. Get help right away if: You develop sudden, severe hand pain or swelling. If this happens, stop doing these exercises right away. Do not do them again unless your health care provider says that you can. This information is not intended to replace advice given to you by your health care provider. Make sure you discuss any questions you have with your healthcare provider. Document Revised: 10/22/2018 Document Reviewed: 07/02/2018 Elsevier Patient Education  [place].

## 2021-01-17 ENCOUNTER — Other Ambulatory Visit: Payer: Self-pay | Admitting: Internal Medicine

## 2021-01-17 DIAGNOSIS — K219 Gastro-esophageal reflux disease without esophagitis: Secondary | ICD-10-CM

## 2021-01-17 LAB — COMPLETE METABOLIC PANEL WITH GFR
AG Ratio: 1.2 (calc) (ref 1.0–2.5)
ALT: 35 U/L (ref 9–46)
AST: 36 U/L — ABNORMAL HIGH (ref 10–35)
Albumin: 4.2 g/dL (ref 3.6–5.1)
Alkaline phosphatase (APISO): 48 U/L (ref 35–144)
BUN: 16 mg/dL (ref 7–25)
CO2: 26 mmol/L (ref 20–32)
Calcium: 9.6 mg/dL (ref 8.6–10.3)
Chloride: 102 mmol/L (ref 98–110)
Creat: 0.84 mg/dL (ref 0.70–1.18)
GFR, Est African American: 103 mL/min/{1.73_m2} (ref >=60)
GFR, Est Non African American: 89 mL/min/{1.73_m2} (ref >=60)
Globulin: 3.5 g/dL (calc) (ref 1.9–3.7)
Glucose, Bld: 106 mg/dL — ABNORMAL HIGH (ref 65–99)
Potassium: 4.6 mmol/L (ref 3.5–5.3)
Sodium: 138 mmol/L (ref 135–146)
Total Bilirubin: 0.8 mg/dL (ref 0.2–1.2)
Total Protein: 7.7 g/dL (ref 6.1–8.1)

## 2021-01-17 LAB — CBC WITH DIFFERENTIAL/PLATELET
Absolute Monocytes: 737 cells/uL (ref 200–950)
Basophils Absolute: 41 cells/uL (ref 0–200)
Basophils Relative: 0.5 %
Eosinophils Absolute: 219 cells/uL (ref 15–500)
Eosinophils Relative: 2.7 %
HCT: 45.6 % (ref 38.5–50.0)
Hemoglobin: 15.1 g/dL (ref 13.2–17.1)
Lymphs Abs: 2009 cells/uL (ref 850–3900)
MCH: 31.7 pg (ref 27.0–33.0)
MCHC: 33.1 g/dL (ref 32.0–36.0)
MCV: 95.6 fL (ref 80.0–100.0)
MPV: 10.4 fL (ref 7.5–12.5)
Monocytes Relative: 9.1 %
Neutro Abs: 5095 cells/uL (ref 1500–7800)
Neutrophils Relative %: 62.9 %
Platelets: 254 10*3/uL (ref 140–400)
RBC: 4.77 10*6/uL (ref 4.20–5.80)
RDW: 14 % (ref 11.0–15.0)
Total Lymphocyte: 24.8 %
WBC: 8.1 10*3/uL (ref 3.8–10.8)

## 2021-01-17 LAB — URIC ACID: Uric Acid, Serum: 3.8 mg/dL — ABNORMAL LOW (ref 4.0–8.0)

## 2021-01-17 NOTE — Progress Notes (Signed)
Uric acid is WNL.  CBC WNL.  AST is borderline elevated-36. Glucose is 106. Rest of CMP WNL.

## 2021-01-30 ENCOUNTER — Telehealth: Payer: Self-pay | Admitting: Internal Medicine

## 2021-01-30 NOTE — Telephone Encounter (Signed)
Left message for pt that his Christopher Smith has been ordered an is set to arrive to the office on 02/01/21. He is due for his injection on 02/02/21. Let him know we will call him when it arrives at the office.

## 2021-01-30 NOTE — Telephone Encounter (Signed)
Patient calling to follow up on Stelara injection

## 2021-02-02 ENCOUNTER — Telehealth: Payer: Self-pay | Admitting: Internal Medicine

## 2021-02-02 ENCOUNTER — Ambulatory Visit (HOSPITAL_COMMUNITY)
Admission: RE | Admit: 2021-02-02 | Discharge: 2021-02-02 | Disposition: A | Discharge status: Home / Self Care | Payer: Medicare Other | Source: Ambulatory Visit | Attending: Physician Assistant | Admitting: Physician Assistant

## 2021-02-02 ENCOUNTER — Other Ambulatory Visit: Payer: Self-pay

## 2021-02-02 DIAGNOSIS — R7611 Nonspecific reaction to tuberculin skin test without active tuberculosis: Secondary | ICD-10-CM | POA: Insufficient documentation

## 2021-02-02 NOTE — Telephone Encounter (Signed)
Spoke with pts wife and let her know it is here and he can come and pick it up, we close at 5pm. She verbalized understanding.

## 2021-02-02 NOTE — Telephone Encounter (Signed)
Inbound call from pt requesting a call back stating that he was following up on his Stelara injection. Please advise. Thanks

## 2021-02-05 NOTE — Progress Notes (Signed)
No evidence of acute cardiopulmonary disease noted on CXR.

## 2021-02-09 ENCOUNTER — Other Ambulatory Visit: Payer: Self-pay | Admitting: Physician Assistant

## 2021-02-09 NOTE — Telephone Encounter (Signed)
Next Visit: 06/18/2021  Last Visit: 01/16/2021  Last Fill: 02/29/2020  DX: Seronegative arthritis   Current Dose per office note 01/16/2021: Methotrexate 0.6 ml subcutaneous injections once weekly  Labs: 01/16/2021, Uric acid is WNL.  CBC WNL.  AST is borderline elevated-36. Glucose is 106.Rest of CMP WNL.   Okay to refill MTX?

## 2021-02-15 DIAGNOSIS — E785 Hyperlipidemia, unspecified: Secondary | ICD-10-CM | POA: Diagnosis not present

## 2021-02-15 DIAGNOSIS — R7303 Prediabetes: Secondary | ICD-10-CM | POA: Diagnosis not present

## 2021-02-15 DIAGNOSIS — I10 Essential (primary) hypertension: Secondary | ICD-10-CM | POA: Diagnosis not present

## 2021-02-15 DIAGNOSIS — M109 Gout, unspecified: Secondary | ICD-10-CM | POA: Diagnosis not present

## 2021-02-15 DIAGNOSIS — Z125 Encounter for screening for malignant neoplasm of prostate: Secondary | ICD-10-CM | POA: Diagnosis not present

## 2021-02-21 DIAGNOSIS — K5190 Ulcerative colitis, unspecified, without complications: Secondary | ICD-10-CM | POA: Diagnosis not present

## 2021-02-21 DIAGNOSIS — K219 Gastro-esophageal reflux disease without esophagitis: Secondary | ICD-10-CM | POA: Diagnosis not present

## 2021-02-21 DIAGNOSIS — I10 Essential (primary) hypertension: Secondary | ICD-10-CM | POA: Diagnosis not present

## 2021-02-21 DIAGNOSIS — Z0000 Encounter for general adult medical examination without abnormal findings: Secondary | ICD-10-CM | POA: Diagnosis not present

## 2021-02-21 DIAGNOSIS — R5383 Other fatigue: Secondary | ICD-10-CM | POA: Diagnosis not present

## 2021-02-21 DIAGNOSIS — M109 Gout, unspecified: Secondary | ICD-10-CM | POA: Diagnosis not present

## 2021-02-21 DIAGNOSIS — J3489 Other specified disorders of nose and nasal sinuses: Secondary | ICD-10-CM | POA: Diagnosis not present

## 2021-02-21 DIAGNOSIS — R7303 Prediabetes: Secondary | ICD-10-CM | POA: Diagnosis not present

## 2021-04-19 ENCOUNTER — Other Ambulatory Visit: Payer: Self-pay | Admitting: Physician Assistant

## 2021-04-19 NOTE — Telephone Encounter (Signed)
Last Visit: 01/16/2021   Last Fill: 02/29/2020   DX: Idiopathic chronic gout of multiple sites without tophus    Current Dose per office note 01/16/2021:  allopurinol 300 mg 1 tablet by mouth daily   Labs: 01/16/2021, Uric acid is WNL.  CBC WNL.  AST is borderline elevated-36. Glucose is 106.Rest of CMP WNL.    Okay to refill Allopurinol?

## 2021-04-24 ENCOUNTER — Other Ambulatory Visit: Payer: Self-pay | Admitting: Rheumatology

## 2021-04-24 NOTE — Telephone Encounter (Signed)
Next Visit: 06/18/2021  Last Visit: 01/16/2021,   Last Fill: 12/19/2020  Dx: Seronegative arthritis   Current Dose per office note on 03/22/9891: folic acid 1 mg daily  Okay to refill folic acid?

## 2021-05-22 ENCOUNTER — Other Ambulatory Visit: Payer: Self-pay | Admitting: Internal Medicine

## 2021-05-22 DIAGNOSIS — K219 Gastro-esophageal reflux disease without esophagitis: Secondary | ICD-10-CM

## 2021-05-24 ENCOUNTER — Telehealth: Payer: Self-pay | Admitting: Internal Medicine

## 2021-05-24 NOTE — Telephone Encounter (Signed)
Discussed with pt that we only get the drug from [facility] every 8 weeks, he just picked up his dose today. He is aware,

## 2021-06-08 NOTE — Progress Notes (Signed)
Office Visit Note  Patient: Christopher Smith             Date of Birth: 1950/07/29           MRN: [id number]             PCP: Janie Morning, DO Referring: Janie Morning, DO Visit Date: 06/18/2021 Occupation: @GUAROCC @  Subjective:  Pain in multiple joints.   History of Present Illness: ADISON JERGER is a 70 y.o. male with history of ulcerative colitis, inflammatory arthritis and gouty arthropathy.  He states he continues to have severe pain and discomfort in his bilateral hands, bilateral knee joints and his feet.  He also has discomfort in his shoulders.  He has not noticed any joint swelling.  He has been tolerating methotrexate and Stelara without any side effects.  He denies having any gout flares.  He states that he stays active and he continues to have some lower back pain.  Activities of Daily Living:  Patient reports morning stiffness for  all day. Patient Reports nocturnal pain.  Difficulty dressing/grooming: Reports Difficulty climbing stairs: Reports Difficulty getting out of chair: Reports Difficulty using hands for taps, buttons, cutlery, and/or writing: Reports  Review of Systems  Constitutional:  Positive for fatigue.  HENT:  Positive for mouth dryness. Negative for mouth sores and nose dryness.   Eyes:  Positive for dryness. Negative for pain and itching.  Respiratory:  Negative for shortness of breath and difficulty breathing.   Cardiovascular:  Negative for chest pain and palpitations.  Gastrointestinal:  Positive for diarrhea. Negative for blood in stool and constipation.  Endocrine: Negative for increased urination.  Genitourinary:  Negative for difficulty urinating.  Musculoskeletal:  Positive for joint pain, joint pain, joint swelling and morning stiffness. Negative for myalgias, muscle tenderness and myalgias.  Skin:  Negative for color change, rash and redness.  Allergic/Immunologic: Negative for susceptible to infections.  Neurological:  Positive for  dizziness. Negative for numbness, headaches, memory loss and weakness.  Hematological:  Negative for bruising/bleeding tendency.  Psychiatric/Behavioral:  Negative for confusion.    PMFS History:  Patient Active Problem List   Diagnosis Date Noted   Open fracture of one or more phalanges of hand 11/21/2017   Chronic inflammatory arthritis 08/05/2016   High risk medication use 08/05/2016   Contracture of elbow, right 08/05/2016   History of total knee replacement, bilateral 08/05/2016   Idiopathic chronic gout 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 tophus 08/05/2016   History of bilateral rotator cuff tear repair 08/05/2016   Gastroesophageal reflux  08/05/2016   Latent tuberculosis by blood test 08/05/2016   Ulcerative colitis 09/03/2011   Glucose intolerance (impaired glucose tolerance) 09/03/2011   Severe diarrhea 09/03/2011   URI (upper respiratory infection) 07/02/2011   Bile salt-induced diarrhea 07/02/2011   S/P cholecystectomy 07/02/2011   Ulcerative colitis, unspecified 04/03/2011   Benign neoplasm of colon 04/03/2011   Positive TB test 03/22/2011   Insomnia due to medical condition 03/22/2011   UC (ulcerative colitis) ([place]) 03/01/2011   Leg cramps 03/01/2011   Ulcerative colitis with rectal bleeding ([employer]) 01/24/2011   Nonspecific abnormal finding in stool contents 01/24/2011   Heme positive stool 01/11/2011   Diarrhea 01/11/2011   Arthritis associated with inflammatory bowel disease 01/11/2011   Intentional weight loss 01/11/2011    Past Medical History:  Diagnosis Date   Allergy    Anal fissure    Arthritis    RA   Blood transfusion without reported diagnosis    COVID-19  06/2020   Diabetes mellitus    no meds taken 05-01-16- elevated glucose was from predisone for UC   Diarrhea    Fatty liver    GERD (gastroesophageal reflux disease)    Gout    Hiatal hernia    Hypertension    Internal hemorrhoids    Polyarthritis    PONV (postoperative nausea and vomiting)    Sessile rectal  polyp 03/27/2011   Sinusitis    Tuberculosis    positive PPD- had an allergic reaction to the PPD, gets yearly chest xray   Tubular adenoma of colon    Ulcerative colitis 01/24/2011    Family History  Problem Relation Age of Onset   Alzheimer's disease Father    Stroke Mother    Cancer Mother        liver & breast    Breast cancer Mother    Liver cancer Mother    Rheum arthritis Brother    Rheum arthritis Brother    Rheum arthritis Sister    Rheum arthritis Sister    Breast cancer Sister    Rheum arthritis Sister    Breast cancer Sister    Colon cancer Neg Hx    Esophageal cancer Neg Hx    Stomach cancer Neg Hx    Rectal cancer Neg Hx    Colon polyps Neg Hx    Past Surgical History:  Procedure Laterality Date   CHOLECYSTECTOMY     COLONOSCOPY     KNEE SURGERY     Bilateral    POLYPECTOMY     SHOULDER SURGERY Bilateral    Bilateral - rotator cuff repair    TOTAL KNEE ARTHROPLASTY Bilateral    Social History   Social History Narrative   1 caffeine drink daily    Immunization History  Administered Date(s) Administered   Influenza, High Dose Seasonal PF 04/28/2019   Moderna Sars-Covid-2 Vaccination 09/08/2019, 10/06/2019   Tdap 11/19/2017     Objective: Vital Signs: BP 131/75 (BP Location: Left Arm, Patient Position: Sitting, Cuff Size: Normal)   Pulse (!) 59   Ht 5' 11"  (1.803 m)   Wt 226 lb 12.8 oz (102.9 kg)   BMI 31.63 kg/m    Physical Exam Vitals and nursing note reviewed.  Constitutional:      Appearance: He is well-developed.  HENT:     Head: Normocephalic and atraumatic.  Eyes:     Conjunctiva/sclera: Conjunctivae normal.     Pupils: Pupils are equal, round, and reactive to light.  Cardiovascular:     Rate and Rhythm: Normal rate and regular rhythm.     Heart sounds: Normal heart sounds.  Pulmonary:     Effort: Pulmonary effort is normal.     Breath sounds: Normal breath sounds.  Abdominal:     General: Bowel sounds are normal.      Palpations: Abdomen is soft.  Musculoskeletal:     Cervical back: Normal range of motion and neck supple.  Skin:    General: Skin is warm and dry.     Capillary Refill: Capillary refill takes less than 2 seconds.  Neurological:     Mental Status: He is alert and oriented to person, place, and time.  Psychiatric:        Behavior: Behavior normal.     Musculoskeletal Exam: C-spine was in good range of motion.  He had discomfort range of motion lumbar spine.  There was no SI joint tenderness.  He had painful range of motion of his shoulders.  He fractured his right elbow joint without any swelling or synovitis.  There was no synovitis of the wrist joints or MCPs.  He had bilateral PIP and DIP thickening with no synovitis.  Hip joints with good range of motion.  His bilateral knee joints are replaced without any warmth swelling or effusion.  There was no tenderness over ankles or MTPs.  CDAI Exam: CDAI Score: -- Patient Global: --; Provider Global: -- Swollen: --; Tender: -- Joint Exam 06/18/2021   No joint exam has been documented for this visit   There is currently no information documented on the homunculus. Go to the Rheumatology activity and complete the homunculus joint exam.  Investigation: No additional findings.  Imaging: No results found.  Recent Labs: Lab Results  Component Value Date   WBC 8.1 01/16/2021   HGB 15.1 01/16/2021   PLT 254 01/16/2021   NA 138 01/16/2021   K 4.6 01/16/2021   CL 102 01/16/2021   CO2 26 01/16/2021   GLUCOSE 106 (H) 01/16/2021   BUN 16 01/16/2021   CREATININE 0.84 01/16/2021   BILITOT 0.8 01/16/2021   ALKPHOS 65 11/19/2016   AST 36 (H) 01/16/2021   ALT 35 01/16/2021   PROT 7.7 01/16/2021   ALBUMIN 4.4 11/19/2016   CALCIUM 9.6 01/16/2021   GFRAA 103 01/16/2021    Speciality Comments: Last Dexa Scan 03/21/2020  Prior therapy: Humira and Remicaide (inadequate response)  Procedures:  No procedures performed Allergies: Patient has  no known allergies.   Assessment / Plan:     Visit Diagnoses: Seronegative arthritis - Inflammatory, erosive: He had no synovitis on examination today.  His symptoms appears to be quite well controlled on combination of methotrexate and Stelara infusions.  High risk medication use - Methotrexate 0.6 ml subcutaneous injections once weekly, folic acid 1 mg daily, and Stelara IV infusions prescribed by GI.  - Plan: CBC with Differential/Platelet, COMPLETE METABOLIC PANEL WITH GFR today and then every 3 months to monitor for drug toxicity.  His LFTs were mildly elevated at the last lab work.  He states he was drinking alcohol which he quit drinking now.  Information on immunization was discussed.  He was also advised to stop methotrexate and Stelara in case he develops an infection.  He may resume the medication once the infection resolves.  Positive TB test - 2011.  Most recent chest x-ray was on February 04, 2021 which was stable with no active cardiopulmonary process  Other ulcerative colitis with other complication ([place]) - continues to follow up with Dr. Hilarie Fredrickson.  He will remain on IV stelara as prescribed and methotrexate 0.6 ml sq injections once weekly.   Idiopathic chronic gout of multiple sites without tophus - allopurinol 300 mg 1 tablet by mouth daily for management of gout -his uric acid has been in desirable range.  He has not had a gout flare.  Plan: Uric acid  Primary osteoarthritis of both hands -he has severe osteoarthritis in his bilateral hands.  No synovitis was noted.  Patient describes that he has severe pain and discomfort in his bilateral hands.  Per his request I will refer him to pain management.  Plan: Ambulatory referral to Physical Medicine Rehab  Contracture of right elbow-no synovitis was noted.  History of total bilateral knee replacement-he continues to have pain and discomfort in his knee joints.  History of bilateral rotator cuff tear repair-chronic pain.  Chronic  midline lower back pain without sciatica-he continues to have some lower back pain.  Core strengthening exercises were discussed.  A handout on back exercises was given.  Weight loss diet and exercise was emphasized.  He plans to join [facility] for exercise.  History of gastroesophageal reflux (GERD)  Orders: Orders Placed This Encounter  Procedures   CBC with Differential/Platelet   COMPLETE METABOLIC PANEL WITH GFR   Uric acid   Ambulatory referral to Physical Medicine Rehab    Meds ordered this encounter  Medications   methotrexate 50 MG/2ML injection    Sig: INJECT 0.6 ML UNDER THE SKIN ONCE PER WEEK AS DIRECTED    Dispense:  8 mL    Refill:  0   allopurinol (ZYLOPRIM) 300 MG tablet    Sig: Take 1 tablet (300 mg total) by mouth daily.    Dispense:  90 tablet    Refill:  0      Follow-Up Instructions: Return in about 5 months (around 11/16/2021) for UC, inflammatory arthritis, Gout.   Bo Merino, MD  Note - This record has been created using Editor, commissioning.  Chart creation errors have been sought, but may not always  have been located. Such creation errors do not reflect on  the standard of medical care.

## 2021-06-18 ENCOUNTER — Ambulatory Visit (INDEPENDENT_AMBULATORY_CARE_PROVIDER_SITE_OTHER): Payer: Medicare Other | Admitting: Rheumatology

## 2021-06-18 ENCOUNTER — Encounter: Payer: Self-pay | Admitting: Rheumatology

## 2021-06-18 ENCOUNTER — Other Ambulatory Visit: Payer: Self-pay

## 2021-06-18 VITALS — BP 131/75 | HR 59 | Ht 71.0 in | Wt 226.8 lb

## 2021-06-18 DIAGNOSIS — M545 Low back pain, unspecified: Secondary | ICD-10-CM

## 2021-06-18 DIAGNOSIS — K51818 Other ulcerative colitis with other complication: Secondary | ICD-10-CM | POA: Diagnosis not present

## 2021-06-18 DIAGNOSIS — M1A09X0 Idiopathic chronic gout, multiple sites, without tophus (tophi): Secondary | ICD-10-CM | POA: Diagnosis not present

## 2021-06-18 DIAGNOSIS — M1380 Other specified arthritis, unspecified site: Secondary | ICD-10-CM

## 2021-06-18 DIAGNOSIS — G8929 Other chronic pain: Secondary | ICD-10-CM

## 2021-06-18 DIAGNOSIS — M19041 Primary osteoarthritis, right hand: Secondary | ICD-10-CM | POA: Diagnosis not present

## 2021-06-18 DIAGNOSIS — Z96653 Presence of artificial knee joint, bilateral: Secondary | ICD-10-CM

## 2021-06-18 DIAGNOSIS — Z79899 Other long term (current) drug therapy: Secondary | ICD-10-CM | POA: Diagnosis not present

## 2021-06-18 DIAGNOSIS — M65341 Trigger finger, right ring finger: Secondary | ICD-10-CM

## 2021-06-18 DIAGNOSIS — M12819 Other specific arthropathies, not elsewhere classified, unspecified shoulder: Secondary | ICD-10-CM | POA: Diagnosis not present

## 2021-06-18 DIAGNOSIS — Z8719 Personal history of other diseases of the digestive system: Secondary | ICD-10-CM

## 2021-06-18 DIAGNOSIS — U071 COVID-19: Secondary | ICD-10-CM

## 2021-06-18 DIAGNOSIS — M24521 Contracture, right elbow: Secondary | ICD-10-CM

## 2021-06-18 DIAGNOSIS — M19042 Primary osteoarthritis, left hand: Secondary | ICD-10-CM | POA: Diagnosis not present

## 2021-06-18 DIAGNOSIS — R7611 Nonspecific reaction to tuberculin skin test without active tuberculosis: Secondary | ICD-10-CM

## 2021-06-18 MED ORDER — METHOTREXATE SODIUM CHEMO INJECTION 50 MG/2ML: INTRAMUSCULAR | 0 refills | Status: DC

## 2021-06-18 MED ORDER — ALLOPURINOL 300 MG PO TABS
300.0000 mg | ORAL_TABLET | Freq: Every day | ORAL | 0 refills | Status: DC
Start: 2021-06-18 — End: 2021-09-18

## 2021-06-18 NOTE — Patient Instructions (Addendum)
Standing Labs We placed an order today for your standing lab work.   Please have your standing labs drawn in March and every 3 months  If possible, please have your labs drawn 2 weeks prior to your appointment so that the provider can discuss your results at your appointment.  Please note that you may see your imaging and lab results in [facility] before we have reviewed them. We may be awaiting multiple results to interpret others before contacting you. Please allow our office up to 72 hours to thoroughly review all of the results before contacting the office for clarification of your results.  We have open lab daily: Monday through Thursday from 1:30-4:30 PM and Friday from 1:30-4:00 PM at the office of Dr. Bo Merino, [facility].   Please be advised, all patients with office appointments requiring lab work will take precedent over walk-in lab work.  If possible, please come for your lab work on Monday and Friday afternoons, as you may experience shorter wait times. The office is located at [address] [place], [place], [place] [address] No appointment is necessary.   Labs are drawn by [facility]. Please bring your co-pay at the time of your lab draw.  You may receive a bill from [facility] for your lab work.  If you wish to have your labs drawn at another location, please call the office 24 hours in advance to send orders.  If you have any questions regarding directions or hours of operation,  please call [phone].   As a reminder, please drink plenty of water prior to coming for your lab work. Thanks!   Vaccines You are taking a medication(s) that can suppress your immune system.  The following immunizations are recommended: Flu annually Covid-19  Td/Tdap (tetanus, diphtheria, pertussis) every 10 years Pneumonia (Prevnar 15 then Pneumovax 23 at least 1 year apart.  Alternatively, can take Prevnar 20 without needing additional dose) Shingrix: 2 doses from 4 weeks  to 6 months apart  Please check with your PCP to make sure you are up to date.   If you have signs or symptoms of an infection or start antibiotics: First, call your PCP for workup of your infection. Hold your medication through the infection, until you complete your antibiotics, and until symptoms resolve if you take the following: Injectable medication (Actemra, Benlysta, Cimzia, Cosentyx, Enbrel, Humira, Kevzara, Orencia, Remicade, Simponi, Stelara, Taltz, Tremfya) Methotrexate Leflunomide (Arava) Mycophenolate (Cellcept) Morrie Sheldon, Olumiant, or Rinvoq   Back Exercises The following exercises strengthen the muscles that help to support the trunk (torso) and back. They also help to keep the lower back flexible. Doing these exercises can help to prevent or lessen existing low back pain. If you have back pain or discomfort, try doing these exercises 2-3 times each day or as told by your health care provider. As your pain improves, do them once each day, but increase the number of times that you repeat the steps for each exercise (do more repetitions). To prevent the recurrence of back pain, continue to do these exercises once each day or as told by your health care provider. Do exercises exactly as told by your health care provider and adjust them as directed. It is normal to feel mild stretching, pulling, tightness, or discomfort as you do these exercises, but you should stop right away if you feel sudden pain or your pain gets worse. Exercises Single knee to chest Repeat these steps 3-5 times for each leg: Lie on your back on  a firm bed or the floor with your legs extended. Bring one knee to your chest. Your other leg should stay extended and in contact with the floor. Hold your knee in place by grabbing your knee or thigh with both hands and hold. Pull on your knee until you feel a gentle stretch in your lower back or buttocks. Hold the stretch for 10-30 seconds. Slowly release and  straighten your leg.  Pelvic tilt Repeat these steps 5-10 times: Lie on your back on a firm bed or the floor with your legs extended. Bend your knees so they are pointing toward the ceiling and your feet are flat on the floor. Tighten your lower abdominal muscles to press your lower back against the floor. This motion will tilt your pelvis so your tailbone points up toward the ceiling instead of pointing to your feet or the floor. With gentle tension and even breathing, hold this position for 5-10 seconds.  Cat-cow Repeat these steps until your lower back becomes more flexible: Get into a hands-and-knees position on a firm bed or the floor. Keep your hands under your shoulders, and keep your knees under your hips. You may place padding under your knees for comfort. Let your head hang down toward your chest. Contract your abdominal muscles and point your tailbone toward the floor so your lower back becomes rounded like the back of a cat. Hold this position for 5 seconds. Slowly lift your head, let your abdominal muscles relax, and point your tailbone up toward the ceiling so your back forms a sagging arch like the back of a cow. Hold this position for 5 seconds.  Press-ups Repeat these steps 5-10 times: Lie on your abdomen (face-down) on a firm bed or the floor. Place your palms near your head, about shoulder-width apart. Keeping your back as relaxed as possible and keeping your hips on the floor, slowly straighten your arms to raise the top half of your body and lift your shoulders. Do not use your back muscles to raise your upper torso. You may adjust the placement of your hands to make yourself more comfortable. Hold this position for 5 seconds while you keep your back relaxed. Slowly return to lying flat on the floor.  Bridges Repeat these steps 10 times: Lie on your back on a firm bed or the floor. Bend your knees so they are pointing toward the ceiling and your feet are flat on the  floor. Your arms should be flat at your sides, next to your body. Tighten your buttocks muscles and lift your buttocks off the floor until your waist is at almost the same height as your knees. You should feel the muscles working in your buttocks and the back of your thighs. If you do not feel these muscles, slide your feet 1-2 inches (2.5-5 cm) farther away from your buttocks. Hold this position for 3-5 seconds. Slowly lower your hips to the starting position, and allow your buttocks muscles to relax completely. If this exercise is too easy, try doing it with your arms crossed over your chest. Abdominal crunches Repeat these steps 5-10 times: Lie on your back on a firm bed or the floor with your legs extended. Bend your knees so they are pointing toward the ceiling and your feet are flat on the floor. Cross your arms over your chest. Tip your chin slightly toward your chest without bending your neck. Tighten your abdominal muscles and slowly raise your torso high enough to lift your shoulder blades  a tiny bit off the floor. Avoid raising your torso higher than that because it can put too much stress on your lower back and does not help to strengthen your abdominal muscles. Slowly return to your starting position.  Back lifts Repeat these steps 5-10 times: Lie on your abdomen (face-down) with your arms at your sides, and rest your forehead on the floor. Tighten the muscles in your legs and your buttocks. Slowly lift your chest off the floor while you keep your hips pressed to the floor. Keep the back of your head in line with the curve in your back. Your eyes should be looking at the floor. Hold this position for 3-5 seconds. Slowly return to your starting position.  Contact a health care provider if: Your back pain or discomfort gets much worse when you do an exercise. Your worsening back pain or discomfort does not lessen within 2 hours after you exercise. If you have any of these  problems, stop doing these exercises right away. Do not do them again unless your health care provider says that you can. Get help right away if: You develop sudden, severe back pain. If this happens, stop doing the exercises right away. Do not do them again unless your health care provider says that you can. This information is not intended to replace advice given to you by your health care provider. Make sure you discuss any questions you have with your health care provider. Document Revised: 12/26/2020 Document Reviewed: 09/13/2020 Elsevier Patient Education  [place].

## 2021-06-19 LAB — CBC WITH DIFFERENTIAL/PLATELET
Absolute Monocytes: 644 cells/uL (ref 200–950)
Basophils Absolute: 30 cells/uL (ref 0–200)
Basophils Relative: 0.4 %
Eosinophils Absolute: 207 cells/uL (ref 15–500)
Eosinophils Relative: 2.8 %
HCT: 46.1 % (ref 38.5–50.0)
Hemoglobin: 15.5 g/dL (ref 13.2–17.1)
Lymphs Abs: 1902 cells/uL (ref 850–3900)
MCH: 32 pg (ref 27.0–33.0)
MCHC: 33.6 g/dL (ref 32.0–36.0)
MCV: 95.1 fL (ref 80.0–100.0)
MPV: 11.1 fL (ref 7.5–12.5)
Monocytes Relative: 8.7 %
Neutro Abs: 4618 cells/uL (ref 1500–7800)
Neutrophils Relative %: 62.4 %
Platelets: 262 10*3/uL (ref 140–400)
RBC: 4.85 10*6/uL (ref 4.20–5.80)
RDW: 13.4 % (ref 11.0–15.0)
Total Lymphocyte: 25.7 %
WBC: 7.4 10*3/uL (ref 3.8–10.8)

## 2021-06-19 LAB — COMPLETE METABOLIC PANEL WITH GFR
AG Ratio: 1.3 (calc) (ref 1.0–2.5)
ALT: 30 U/L (ref 9–46)
AST: 23 U/L (ref 10–35)
Albumin: 4.2 g/dL (ref 3.6–5.1)
Alkaline phosphatase (APISO): 47 U/L (ref 35–144)
BUN: 12 mg/dL (ref 7–25)
CO2: 27 mmol/L (ref 20–32)
Calcium: 9.4 mg/dL (ref 8.6–10.3)
Chloride: 101 mmol/L (ref 98–110)
Creat: 0.84 mg/dL (ref 0.70–1.28)
Globulin: 3.3 g/dL (calc) (ref 1.9–3.7)
Glucose, Bld: 111 mg/dL — ABNORMAL HIGH (ref 65–99)
Potassium: 4.7 mmol/L (ref 3.5–5.3)
Sodium: 139 mmol/L (ref 135–146)
Total Bilirubin: 0.9 mg/dL (ref 0.2–1.2)
Total Protein: 7.5 g/dL (ref 6.1–8.1)
eGFR: 93 mL/min/{1.73_m2} (ref >=60)

## 2021-06-19 LAB — URIC ACID: Uric Acid, Serum: 4.1 mg/dL (ref 4.0–8.0)

## 2021-06-19 NOTE — Progress Notes (Signed)
CBC and CMP are normal.  Uric acid is in desirable range.

## 2021-07-30 ENCOUNTER — Telehealth: Payer: Self-pay | Admitting: Internal Medicine

## 2021-07-30 NOTE — Telephone Encounter (Signed)
Patient called and stated that he would like to see if he can get a note to dismiss patient from jury duty due to having bathroom constantly Seeking advice, Please advise.

## 2021-07-31 NOTE — Telephone Encounter (Signed)
Called and spoke with pt. Let pt know we could provide a letter. Asked pt if he knew juror number. Pt stated he would call us back with number.

## 2021-07-31 NOTE — Telephone Encounter (Signed)
Ok to provide such letter based on his IBD

## 2021-07-31 NOTE — Telephone Encounter (Signed)
Patient called requesting to speak with you regarding message below.

## 2021-07-31 NOTE — Telephone Encounter (Signed)
Letter given to front desk. Pt states he will be by today to pick up letter.

## 2021-08-13 ENCOUNTER — Telehealth: Payer: Self-pay | Admitting: Internal Medicine

## 2021-08-13 NOTE — Telephone Encounter (Signed)
Pt states he had a flare of his colitis and now his hemorrhoids are bothering him. Requesting to be seen. Pt scheduled to see Amy Esterwood PA 08/16/21@3pm . Pt instructed to try sitz bathes and to use wet wipes and recticare. He is aware of appt.

## 2021-08-13 NOTE — Telephone Encounter (Signed)
Patient called requesting to speak with a nurse regarding hemorrhoids.

## 2021-08-13 NOTE — Telephone Encounter (Signed)
Left message for pt to call back  °

## 2021-08-16 ENCOUNTER — Encounter: Payer: Self-pay | Admitting: Physician Assistant

## 2021-08-16 ENCOUNTER — Other Ambulatory Visit (INDEPENDENT_AMBULATORY_CARE_PROVIDER_SITE_OTHER): Payer: Medicare PPO

## 2021-08-16 ENCOUNTER — Ambulatory Visit: Payer: Medicare PPO | Admitting: Physician Assistant

## 2021-08-16 VITALS — BP 120/70 | HR 68 | Ht 69.25 in | Wt 229.0 lb

## 2021-08-16 DIAGNOSIS — R197 Diarrhea, unspecified: Secondary | ICD-10-CM

## 2021-08-16 DIAGNOSIS — K648 Other hemorrhoids: Secondary | ICD-10-CM

## 2021-08-16 DIAGNOSIS — K5100 Ulcerative (chronic) pancolitis without complications: Secondary | ICD-10-CM

## 2021-08-16 DIAGNOSIS — Z79620 Long term (current) use of immunosuppressive biologic: Secondary | ICD-10-CM | POA: Diagnosis not present

## 2021-08-16 LAB — CBC WITH DIFFERENTIAL/PLATELET
Basophils Absolute: 0 10*3/uL (ref 0.0–0.1)
Basophils Relative: 0.5 % (ref 0.0–3.0)
Eosinophils Absolute: 0.3 10*3/uL (ref 0.0–0.7)
Eosinophils Relative: 3.2 % (ref 0.0–5.0)
HCT: 43.9 % (ref 39.0–52.0)
Hemoglobin: 14.6 g/dL (ref 13.0–17.0)
Lymphocytes Relative: 25.5 % (ref 12.0–46.0)
Lymphs Abs: 2.1 10*3/uL (ref 0.7–4.0)
MCHC: 33.2 g/dL (ref 30.0–36.0)
MCV: 93.7 fl (ref 78.0–100.0)
Monocytes Absolute: 0.7 10*3/uL (ref 0.1–1.0)
Monocytes Relative: 8.7 % (ref 3.0–12.0)
Neutro Abs: 5.2 10*3/uL (ref 1.4–7.7)
Neutrophils Relative %: 62.1 % (ref 43.0–77.0)
Platelets: 255 10*3/uL (ref 150.0–400.0)
RBC: 4.69 Mil/uL (ref 4.22–5.81)
RDW: 14.6 % (ref 11.5–15.5)
WBC: 8.3 10*3/uL (ref 4.0–10.5)

## 2021-08-16 LAB — SEDIMENTATION RATE: Sed Rate: 20 mm/hr (ref 0–20)

## 2021-08-16 MED ORDER — PREDNISONE 5 MG PO TABS: ORAL_TABLET | ORAL | 0 refills | Status: AC

## 2021-08-16 MED ORDER — PLENVU 140 G PO SOLR: 1.0000 | ORAL | 0 refills | Status: DC

## 2021-08-16 MED ORDER — HYDROCORTISONE ACETATE 25 MG RE SUPP: 25.0000 mg | Freq: Every day | RECTAL | 1 refills | Status: AC

## 2021-08-16 NOTE — Patient Instructions (Addendum)
If you are age 72 or older, your body mass index should be between 23-30. Your Body mass index is 33.57 kg/m. If this is out of the aforementioned range listed, please consider follow up with your Primary Care Provider. ________________________________________________________  The [facility] providers would like to encourage you to use [facility] to communicate with providers for non-urgent requests or questions.  Due to long hold times on the telephone, sending your provider a message by [facility] may be a faster and more efficient way to get a response.  Please allow 48 business hours for a response.  Please remember that this is for non-urgent requests.  _______________________________________________________  Dennis Bast have been scheduled for a colonoscopy. Please follow written instructions given to you at your visit today.  Please pick up your prep supplies at the pharmacy within the next 1-3 days. If you use inhalers (even only as needed), please bring them with you on the day of your procedure.  Your provider has requested that you go to the basement level for lab work before leaving today. Press "B" on the elevator. The lab is located at the first door on the left as you exit the elevator.  You can use Imodium 2 tablets daily  START Prednisone 5 mg 4 tablet daily for 2 weeks, 3 tablets daily for 2 weeks, the 2 tablets daily for 2 weeks.  Anusol suppositories 1 suppository rectally at bedtime for 7 days as needed for hemorrhoids.  Try Recticare externally on the rectum 2-3 times daily as needed for itching.  Follow up pending the results of your Colonoscopy.  Thank you for entrusting me with your care and choosing [facility]  Amy Esterwood, PA-C

## 2021-08-16 NOTE — Progress Notes (Signed)
Subjective:    Patient ID: Christopher Smith, male    DOB: 03/20/1951, 71 y.o.   MRN: [id number]  HPI Christopher Smith is a 71 year old white male, established with Dr. Hilarie Fredrickson with longstanding history of pan ulcerative colitis initially diagnosed in 2011.  He comes in today with concerns about a "flare" of colitis and also with exacerbation of hemorrhoidal symptoms. Patient was last seen in February 2022 about 1 year ago, he had developed antibodies to Cimzia and was switched to Stelara at that time. Last colonoscopy had been done in December 2021 with finding of mild pancolitis mostly active in the rectum and distal sigmoid and also noted internal hemorrhoids.  Biopsies were consistent with quiescent colitis.  Patient relates ongoing issues with chronic diarrhea which he says he has had for the past 10 years.  He says he initially had some improvement with Stelara, but at this point it is not much better than he had been when he stopped Cimzia.  He has been using Imodium 2 tablets/day over the past few weeks and with Imodium usually will have 7 or 8 loose bowel movements per day.  Prior to that he may have at least 10 bowel movements per day he is not complaining of any abdominal pain or cramping, no fever or chills, and no bleeding. He does feel that his symptoms probably get worse a week or so before he is due for his next Stelara injection. He describes early mornings having at least 4-5 bowel movements each day, then has a bowel movement after each meal and may have 2 or 3 other bowel movements during the day.  He denies any fatigue, appetite has been okay and weight has been stable. He feels that his hemorrhoids have been flared up and are uncomfortable.  He says that he can feel a hemorrhoid just at the anus which is irritated  Review of Systems Pertinent positive and negative review of systems were noted in the above HPI section.  All other review of systems was otherwise negative.   Outpatient  Encounter Medications as of 08/16/2021  Medication Sig   allopurinol (ZYLOPRIM) 300 MG tablet Take 1 tablet (300 mg total) by mouth daily.   folic acid (FOLVITE) 1 MG tablet TAKE 1 TABLET BY MOUTH EVERY DAY   hydrocortisone (ANUSOL-HC) 25 MG suppository Place 1 suppository (25 mg total) rectally at bedtime for 7 days.   losartan (COZAAR) 100 MG tablet Take 100 mg by mouth daily.   Methotrexate Sodium (METHOTREXATE, PF,) 50 MG/2ML injection INJECT 0.6 ML UNDER THE SKIN ONCE PER WEEK AS DIRECTED   pantoprazole (PROTONIX) 40 MG tablet TAKE 1 TABLET BY MOUTH EVERY DAY   PEG-KCl-NaCl-NaSulf-Na Asc-C (PLENVU) 140 g SOLR Take 1 kit by mouth as directed.   predniSONE (DELTASONE) 5 MG tablet Take 4 tablets (20 mg total) by mouth daily with breakfast for 14 days, THEN 3 tablets (15 mg total) daily with breakfast for 14 days, THEN 2 tablets (10 mg total) daily with breakfast for 14 days.   Ustekinumab (STELARA IV) Inject into the vein.   [DISCONTINUED] methotrexate 50 MG/2ML injection INJECT 0.6 ML UNDER THE SKIN ONCE PER WEEK AS DIRECTED   No facility-administered encounter medications on file as of 08/16/2021.   No Known Allergies Patient Active Problem List   Diagnosis Date Noted   Open fracture of one or more phalanges of hand 11/21/2017   Chronic inflammatory arthritis 08/05/2016   High risk medication use 08/05/2016   Contracture of elbow,  right 08/05/2016   History of total knee replacement, bilateral 08/05/2016   Idiopathic chronic gout 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 tophus 08/05/2016   History of bilateral rotator cuff tear repair 08/05/2016   Gastroesophageal reflux  08/05/2016   Latent tuberculosis by blood test 08/05/2016   Ulcerative colitis 09/03/2011   Glucose intolerance (impaired glucose tolerance) 09/03/2011   Severe diarrhea 09/03/2011   URI (upper respiratory infection) 07/02/2011   Bile salt-induced diarrhea 07/02/2011   S/P cholecystectomy 07/02/2011   Ulcerative colitis, unspecified  04/03/2011   Benign neoplasm of colon 04/03/2011   Positive TB test 03/22/2011   Insomnia due to medical condition 03/22/2011   UC (ulcerative colitis) ([place]) 03/01/2011   Leg cramps 03/01/2011   Ulcerative colitis with rectal bleeding ([facility]) 01/24/2011   Nonspecific abnormal finding in stool contents 01/24/2011   Heme positive stool 01/11/2011   Diarrhea 01/11/2011   Arthritis associated with inflammatory bowel disease 01/11/2011   Intentional weight loss 01/11/2011   Social History   Socioeconomic History   Marital status: Married    Spouse name: Not on file   Number of children: 2    Years of education: Not on file   Highest education level: Not on file  Occupational History   Occupation: Arts administrator: [employer]  Tobacco Use   Smoking status: Former    Packs/day: 1.00    Types: Cigarettes    Quit date: 07/15/1990    Years since quitting: 31.1   Smokeless tobacco: Former    Types: Nurse, children's Use: Never used  Substance and Sexual Activity   Alcohol use: Not Currently    Alcohol/week: 6.0 standard drinks    Types: 6 Cans of beer per week    Comment: weekly beer   Drug use: No   Sexual activity: Not on file  Other Topics Concern   Not on file  Social History Narrative   1 caffeine drink daily    Social Determinants of Health   Financial Resource Strain: Not on file  Food Insecurity: Not on file  Transportation Needs: Not on file  Physical Activity: Not on file  Stress: Not on file  Social Connections: Not on file  Intimate Partner Violence: Not on file    Christopher Smith family history includes Alzheimer's disease in his father; Breast cancer in his mother, sister, and sister; Cancer in his mother; Liver cancer in his mother; Rheum arthritis in his brother, brother, sister, sister, and sister; Stroke in his mother.      Objective:    Vitals:   08/16/21 1503  BP: 120/70  Pulse: 68    Physical Exam Well-developed  well-nourished older WM in no acute distress.  Height, Weight, 229 BMI33.5  HEENT; nontraumatic normocephalic, EOMI, PE R LA, sclera anicteric. Oropharynx; not examined today Neck; supple, no JVD Cardiovascular; regular rate and rhythm with S1-S2, no murmur rub or gallop Pulmonary; Clear bilaterally Abdomen; soft, there is mild tenderness in the left mid and left lower quadrant nondistended, no palpable mass or hepatosplenomegaly, bowel sounds are active Rectal; not done today Skin; benign exam, no jaundice rash or appreciable lesions Extremities; no clubbing cyanosis or edema skin warm and dry Neuro/Psych; alert and oriented x4, grossly nonfocal mood and affect appropriate        Assessment & Plan:   #50 71 year old white male with history of pan ulcerative colitis initially diagnosed 2011, developed antibodies to Cimzia 2021 and has been on Stelara since February  2022. Most recent colonoscopy December 2021 with mild pancolitis mostly felt to be active in the distal sigmoid and rectum (biopsies showed quiescent colitis)  Patient with numerous bowel movements per day, currently using 2 Imodium per day and having at least 7-8 bowel movements, without Imodium he will have at least 10 bowel movements per day.  It sounds as if he has been dealing with the symptoms for months but has noted some recent progression. Also feels that symptoms seem to be worse for about a week prior to his next Stelara injection.  Concern at this time with exacerbation of colitis, poor response to Stelara.  #2 symptomatic internal hemorrhoids-exacerbated by diarrhea #3 status postcholecystectomy 4.  Inflammatory arthritis-on methotrexate #5 GERD #6 prior history of adenomatous polyps  Plan; CBC with differential, sed rate, CRP, ustekinumab antibody level Start prednisone 20 mg p.o. every morning x2 weeks then decrease to 15 mg daily x2 weeks then decrease to 10 mg daily x2 weeks then discontinue \Continue  Stelara at current dose for now (last dose 07/20/2020) Patient will be scheduled for colonoscopy with Dr. Hilarie Fredrickson.  Procedure was discussed in detail with the patient including indications risk and benefits and he is agreeable to proceed. Start hydrocortisone suppositories nightly x7 days, repeat course as needed for internal hemorrhoid \Start RectiCare complete apply externally 2-3 times daily as needed for hemorrhoids. Further plans pending results of above.  Miriam Kestler S Kito Cuffe PA-C 08/16/2021   Cc: Janie Morning, DO

## 2021-08-17 LAB — C-REACTIVE PROTEIN: CRP: <1 mg/dL (ref 0.5–20.0)

## 2021-08-24 ENCOUNTER — Encounter: Payer: Self-pay | Admitting: Internal Medicine

## 2021-08-24 ENCOUNTER — Ambulatory Visit (AMBULATORY_SURGERY_CENTER): Payer: Medicare PPO | Admitting: Internal Medicine

## 2021-08-24 ENCOUNTER — Other Ambulatory Visit: Payer: Self-pay

## 2021-08-24 VITALS — BP 110/54 | HR 54 | Temp 97.8°F | Resp 13 | Ht 69.0 in | Wt 229.0 lb

## 2021-08-24 DIAGNOSIS — K648 Other hemorrhoids: Secondary | ICD-10-CM

## 2021-08-24 DIAGNOSIS — K5100 Ulcerative (chronic) pancolitis without complications: Secondary | ICD-10-CM

## 2021-08-24 DIAGNOSIS — K51918 Ulcerative colitis, unspecified with other complication: Secondary | ICD-10-CM

## 2021-08-24 MED ORDER — SODIUM CHLORIDE 0.9 % IV SOLN: 500.0000 mL | Freq: Once | INTRAVENOUS | Status: DC

## 2021-08-24 NOTE — Progress Notes (Signed)
Patient presenting for colonoscopy in the setting of ulcerative colitis with ongoing diarrhea.  Stelara therapy.  Stelara level and antibody pending Seen in the office on 08/16/2021 by Nicoletta Ba, PA-C See that note for details.  He remains appropriate for colonoscopy and [facility] today.

## 2021-08-24 NOTE — Patient Instructions (Addendum)
Handouts were given to your care partner on Ulcerative Colitis and hemorrhoids. Continue prednisone taper that was given to you prior to your procedure. The office will follow up STELARA levels. You may resume your current medications today. Await biopsy results.  May take 1-3 weeks to receive pathology results. The office will call you to set up an appointment with Dr. Hilarie Fredrickson in 4-6 weeks to discuss therapeutic options. Please call if any questions or concerns.      YOU HAD AN ENDOSCOPIC PROCEDURE TODAY AT [facility]:   Refer to the procedure report that was given to you for any specific questions about what was found during the examination.  If the procedure report does not answer your questions, please call your gastroenterologist to clarify.  If you requested that your care partner not be given the details of your procedure findings, then the procedure report has been included in a sealed envelope for you to review at your convenience later.  YOU SHOULD EXPECT: Some feelings of bloating in the abdomen. Passage of more gas than usual.  Walking can help get rid of the air that was put into your GI tract during the procedure and reduce the bloating. If you had a lower endoscopy (such as a colonoscopy or flexible sigmoidoscopy) you may notice spotting of blood in your stool or on the toilet paper. If you underwent a bowel prep for your procedure, you may not have a normal bowel movement for a few days.  Please Note:  You might notice some irritation and congestion in your nose or some drainage.  This is from the oxygen used during your procedure.  There is no need for concern and it should clear up in a day or so.  SYMPTOMS TO REPORT IMMEDIATELY:  Following lower endoscopy (colonoscopy or flexible sigmoidoscopy):  Excessive amounts of blood in the stool  Significant tenderness or worsening of abdominal pains  Swelling of the abdomen that is new, acute  Fever of 100F or  higher   For urgent or emergent issues, a gastroenterologist can be reached at any hour by calling [phone]. Do not use MyChart messaging for urgent concerns.    DIET:  We do recommend a small meal at first, but then you may proceed to your regular diet.  Drink plenty of fluids but you should avoid alcoholic beverages for 24 hours.  ACTIVITY:  You should plan to take it easy for the rest of today and you should NOT DRIVE or use heavy machinery until tomorrow (because of the sedation medicines used during the test).    FOLLOW UP: Our staff will call the number listed on your records 48-72 hours following your procedure to check on you and address any questions or concerns that you may have regarding the information given to you following your procedure. If we do not reach you, we will leave a message.  We will attempt to reach you two times.  During this call, we will ask if you have developed any symptoms of COVID 19. If you develop any symptoms (ie: fever, flu-like symptoms, shortness of breath, cough etc.) before then, please call [phone].  If you test positive for Covid 19 in the 2 weeks post procedure, please call and report this information to [place].    If any biopsies were taken you will be contacted by phone or by letter within the next 1-3 weeks.  Please call us at [phone] if you have not heard about the biopsies  in 3 weeks.    SIGNATURES/CONFIDENTIALITY: You and/or your care partner have signed paperwork which will be entered into your electronic medical record.  These signatures attest to the fact that that the information above on your After Visit Summary has been reviewed and is understood.  Full responsibility of the confidentiality of this discharge information lies with you and/or your care-partner.

## 2021-08-24 NOTE — Progress Notes (Signed)
VS by AS.

## 2021-08-24 NOTE — Progress Notes (Signed)
No problems noted in the recovery room. maw 

## 2021-08-24 NOTE — Op Note (Signed)
[facility] Patient Name: Christopher Smith Procedure Date: 08/24/2021 2:05 PM MRN: [id number] Endoscopist: Jerene Bears , MD Age: 71 Referring MD:  Date of Birth: 02-Feb-1951 Gender: Male Account #: [id number] Procedure:                Colonoscopy Indications:              Last colonoscopy: December 2021, Assess therapeutic                            response to therapy of chronic ulcerative                            pancolitis, Stelara x nearly 12 month (previous                            infliximab, adalimumab and certolizumab); increased                            lower abd pain and diarrhea, Stelara level and Ab                            test pending at this time Medicines:                Monitored Anesthesia Care Procedure:                Pre-Anesthesia Assessment:                           - Prior to the procedure, a History and Physical                            was performed, and patient medications and                            allergies were reviewed. The patient's tolerance of                            previous anesthesia was also reviewed. The risks                            and benefits of the procedure and the sedation                            options and risks were discussed with the patient.                            All questions were answered, and informed consent                            was obtained. Prior Anticoagulants: The patient has                            taken no previous anticoagulant or antiplatelet  agents. ASA Grade Assessment: II - A patient with                            mild systemic disease. After reviewing the risks                            and benefits, the patient was deemed in                            satisfactory condition to undergo the procedure.                           After obtaining informed consent, the colonoscope                            was passed under direct vision. Throughout  the                            procedure, the patient's blood pressure, pulse, and                            oxygen saturations were monitored continuously. The                            Olympus CF-HQ190L (Serial# [id number]) Colonoscope was                            introduced through the anus and advanced to the                            terminal ileum. The colonoscopy was performed                            without difficulty. The patient tolerated the                            procedure well. The quality of the bowel                            preparation was good. The terminal ileum, ileocecal                            valve, appendiceal orifice, and rectum were                            photographed. Scope In: 2:17:24 PM Scope Out: 2:28:21 PM Scope Withdrawal Time: 0 hours 9 minutes 1 second  Total Procedure Duration: 0 hours 10 minutes 57 seconds  Findings:                 The digital rectal exam was normal.                           The terminal ileum appeared normal.  Inflammation was found in a continuous and                            circumferential pattern from the rectum to the                            cecum. This was graded as Mayo Score 2 (moderate,                            with marked erythema, absent vascular pattern,                            friability, erosions), and when compared to the                            previous examination, the findings are worsened.                            Four biopsies were taken every 10 cm with a cold                            forceps from the entire colon for ulcerative                            colitis surveillance. These biopsy specimens from                            the right colon, left colon and rectum were sent to                            Pathology.                           Internal hemorrhoids were found during                            retroflexion. The hemorrhoids were  small. Complications:            No immediate complications. Estimated Blood Loss:     Estimated blood loss was minimal. Impression:               - The examined portion of the ileum was normal.                           - Moderately active (Mayo Score 2) pancolitis                            ulcerative colitis, worsened since the last                            examination. Biopsied.                           - Internal hemorrhoids. Recommendation:           - Patient has a  contact number available for                            emergencies. The signs and symptoms of potential                            delayed complications were discussed with the                            patient. Return to normal activities tomorrow.                            Written discharge instructions were provided to the                            patient.                           - Resume previous diet.                           - Continue present medications. Continue prednisone                            taper.                           - We will followup Stelara levels.                           - Await pathology results.                           - Office visit with me in 4-6 weeks to discuss                            therapeutic options.                           - Repeat colonoscopy is recommended for                            surveillance. The colonoscopy date will be                            determined after pathology results from today's                            exam become available for review. Jerene Bears, MD 08/24/2021 2:33:43 PM This report has been signed electronically.

## 2021-08-24 NOTE — Progress Notes (Signed)
A/ox3, pleased with MAC, report to RN 

## 2021-08-24 NOTE — Progress Notes (Signed)
Called to room to assist during endoscopic procedure.  Patient ID and intended procedure confirmed with present staff. Received instructions for my participation in the procedure from the performing physician.  

## 2021-08-26 LAB — USTEKINUMAB AND ANTI-USTEK AB
Anti-Ustekinumab Antibody: <40 ng/mL
Ustekinumab: 3.4 ug/mL

## 2021-08-27 ENCOUNTER — Telehealth: Payer: Self-pay

## 2021-08-27 NOTE — Telephone Encounter (Signed)
Spoke with pt to schedule follow up visit in 4-6 weeks as recommended on colonoscopy report from 08/24/21. Pt is scheduled with Dr. Hilarie Fredrickson on 10/10/21 at 9:50 am. Pt verbalized understanding and had no other concerns at end of call.

## 2021-08-28 ENCOUNTER — Telehealth: Payer: Self-pay | Admitting: *Deleted

## 2021-08-28 NOTE — Telephone Encounter (Signed)
°  Follow up Call-  Call back number 08/24/2021 06/16/2020  Post procedure Call Back phone  # [phone]  Permission to leave phone message Yes Yes  Some recent data might be hidden     Patient questions:  Do you have a fever, pain , or abdominal swelling? No. Pain Score  0 *  Have you tolerated food without any problems? Yes.    Have you been able to return to your normal activities? Yes.    Do you have any questions about your discharge instructions: Diet   No. Medications  No. Follow up visit  No.  Do you have questions or concerns about your Care? No.  Actions: * If pain score is 4 or above: No action needed, pain <4.

## 2021-09-05 ENCOUNTER — Encounter: Payer: Self-pay | Admitting: Internal Medicine

## 2021-09-18 ENCOUNTER — Other Ambulatory Visit: Payer: Self-pay | Admitting: Rheumatology

## 2021-09-18 NOTE — Telephone Encounter (Signed)
Next Visit: 11/19/2021 ? ?Last Visit: 06/18/2021 ? ?Last Fill: 06/18/2021 ? ?DX:  Idiopathic chronic gout of multiple sites without tophus  ? ?Current Dose per office note 06/18/2021: allopurinol 300 mg 1 tablet by mouth daily for management of gout  ? ?Labs: 06/18/2021 CBC and CMP are normal.  Uric acid is in desirable range. ? ?Okay to refill Allopurinol?  ?

## 2021-10-10 ENCOUNTER — Ambulatory Visit: Payer: Medicare PPO | Admitting: Internal Medicine

## 2021-10-10 ENCOUNTER — Telehealth: Payer: Self-pay | Admitting: Pharmacy Technician

## 2021-10-10 ENCOUNTER — Encounter: Payer: Self-pay | Admitting: Internal Medicine

## 2021-10-10 ENCOUNTER — Other Ambulatory Visit: Payer: Medicare PPO

## 2021-10-10 VITALS — BP 130/74 | HR 60 | Ht 69.0 in | Wt 225.0 lb

## 2021-10-10 DIAGNOSIS — K51918 Ulcerative colitis, unspecified with other complication: Secondary | ICD-10-CM

## 2021-10-10 DIAGNOSIS — K219 Gastro-esophageal reflux disease without esophagitis: Secondary | ICD-10-CM

## 2021-10-10 DIAGNOSIS — M0600 Rheumatoid arthritis without rheumatoid factor, unspecified site: Secondary | ICD-10-CM

## 2021-10-10 DIAGNOSIS — K51011 Ulcerative (chronic) pancolitis with rectal bleeding: Secondary | ICD-10-CM

## 2021-10-10 NOTE — Telephone Encounter (Signed)
Auth Submission: PENDING ?Payer: [employer] MEDICARE ?Medication & CPT/J Code(s) submitted: Entyvio (Vedolizumab) [id number] ?Route of submission (phone, fax, portal): PHONE:[phone] ?FAX: [phone] ?Auth type: Buy/Bill ?Units/visits requested: 300MG ?Reference number: EOC - [id number] ? ?Will update once we receive a response. ? ?  ?

## 2021-10-10 NOTE — Progress Notes (Signed)
? ?Subjective:  ? ? Patient ID: Christopher Smith, male    DOB: 20-May-1951, 71 y.o.   MRN: [id number] ? ?HPI ?Christopher Smith is a 71 year old male with a history of longstanding pan ulcerative colitis (diagnosis 2011; previous treatment with infliximab, adalimumab, certolizumab) currently on Stelara, seronegative rheumatoid arthritis, history of adenomatous polyps, GERD who is here for follow-up.  He is here alone today and last saw me for colonoscopy on 08/24/2021. ? ?Just prior to that colonoscopy he was seen by Nicoletta Ba, PA-C on 08/16/2021 with signs and symptoms of active colitis flare.  She started him on a prednisone taper.  He finished prednisone about 3 weeks ago. ? ?Colonoscopy on 08/29/2021 revealed Mayo grade 2 colitis worsened compared to prior exam.  Inflammation was seen from the rectum to cecum.  Biopsies which in my opinion were difficult to interpret, showed focal mild architectural disarray/distortion.  No dysplasia.  It should be noted that he had had about a week of prednisone before these biopsies. ? ?He reports that he continues to have bowel movement frequency and occasional bleeding in stool.  He will have 4-6 bowel movements each morning and then can have 2 or 3 later in the afternoon.  Stools at times can be urgent and he has had accidents if he is away from the bathroom.  His symptoms seem confining to him.  He did improve a little bit with prednisone.  Joint symptoms definitively improved with prednisone. ? ?He will be seeing rheumatology again in May 2023.  He will [place] in [place]. ? ?He wants to explore other options for better control of his colitis. ? ?He previously completed 8 months of isoniazid for latent TB.  He gets annual chest x-rays with Dr. Estanislado Pandy. ? ?Review of Systems ?As per HPI, otherwise negative ? ?Current Medications, Allergies, Past Medical History, Past Surgical History, Family History and Social History were reviewed in  [employer] record. ?   ?Objective:  ? Physical Exam ?BP 130/74   Pulse 60   Ht 5' 9"  (1.753 m)   Wt 225 lb (102.1 kg)   BMI 33.23 kg/m?  ?Gen: awake, alert, NAD ?HEENT: anicteric ?Abd: soft, NT/ND, +BS throughout ?Ext: no c/c/e ?Neuro: nonfocal ? ? ?  Latest Ref Rng & Units 08/16/2021  ?  4:13 PM 06/18/2021  ? 10:08 AM 01/16/2021  ? 10:12 AM  ?CBC  ?WBC 4.0 - 10.5 K/uL 8.3   7.4   8.1    ?Hemoglobin 13.0 - 17.0 g/dL 14.6   15.5   15.1    ?Hematocrit 39.0 - 52.0 % 43.9   46.1   45.6    ?Platelets 150.0 - 400.0 K/uL 255.0   262   254    ? ?CMP  ?   ?Component Value Date/Time  ? NA 139 06/18/2021 1008  ? NA 141 11/19/2016 0718  ? K 4.7 06/18/2021 1008  ? CL 101 06/18/2021 1008  ? CO2 27 06/18/2021 1008  ? GLUCOSE 111 (H) 06/18/2021 1008  ? BUN 12 06/18/2021 1008  ? BUN 14 11/19/2016 0718  ? CREATININE 0.84 06/18/2021 1008  ? CALCIUM 9.4 06/18/2021 1008  ? PROT 7.5 06/18/2021 1008  ? PROT 7.1 11/19/2016 0718  ? ALBUMIN 4.4 11/19/2016 0718  ? AST 23 06/18/2021 1008  ? ALT 30 06/18/2021 1008  ? ALKPHOS 65 11/19/2016 0718  ? BILITOT 0.9 06/18/2021 1008  ? BILITOT 0.5 11/19/2016 0718  ? GFRNONAA  89 01/16/2021 1012  ? GFRAA 103 01/16/2021 1012  ? ? ? ? ? ?   ?Assessment & Plan:  ?71 year old male with a history of longstanding pan ulcerative colitis (diagnosis 2011; previous treatment with infliximab, adalimumab, certolizumab) currently on Stelara, seronegative rheumatoid arthritis, history of adenomatous polyps, GERD who is here for follow-up.  ? ?Pan ulcerative colitis (diagnosis 2011; previous treatment with infliximab/adalimumab/certolizumab and now Stelara) --he is having active signs of ongoing inflammation in this is consistent with what I saw endoscopically.  I think that prednisone given before colonoscopy changed the biopsy results.  I do not think Stelara is effective at controlling his disease based on endoscopic findings and symptoms.  We discussed this today.  I recommended Entyvio.  We  discussed the risk, benefits and alternatives and he is agreeable and wishes to proceed.  I am going to share this treatment decision with rheumatology.  [name] is got specific however my hope is that if we improve his luminal disease his arthritis/arthropathy symptoms will improve.  He is in agreement with changing therapy.  He will continue his methotrexate ?--[name]; I will send this to the infusion center so that hopefully it can be approved.  Once Entyvio starts he should stop Stelara ?--Continue methotrexate under the direction of rheumatology ?--Check a fecal calprotectin today which hopefully we can follow to assess response ?--I inadvertently ordered a QuantiFERON gold failing to remember his prior treatment for latent TB; this test will be unreliable as he has been previously treated and I will try to have the test canceled ?--Follow-up with me in June ? ?2.  GERD --well-controlled on pantoprazole which we will continue 40 mg daily ? ?3.  Seronegative rheumatoid arthritis --followed by rheumatology and on methotrexate.  I am making them aware of my recommendation to change Stelara to [facility] ? ?30 minutes total spent today including patient facing time, coordination of care, reviewing medical history/procedures/pertinent radiology studies, and documentation of the encounter. ? ? ?

## 2021-10-10 NOTE — Patient Instructions (Addendum)
Your provider has requested that you go to the basement level for lab work before leaving today. Press "B" on the elevator. The lab is located at the first door on the left as you exit the elevator. ? ?You will be contacted by Dr Vena Rua nurse regarding starting Entyvio.  ? ?Please keep follow-up on : 01/08/22 at 9:10am ? ?Thank you for choosing me and [facility]. ? ?Dr.Jay Pyrtle  ? ? ? ? ? ?

## 2021-10-11 NOTE — Telephone Encounter (Signed)
Line busy

## 2021-10-11 NOTE — Telephone Encounter (Signed)
Dr. Hilarie Fredrickson, ?Fyi note: ? ?Auth Submission: APPROVED ?Payer: [employer] MEDICARE ?Medication & CPT/J Code(s) submitted: Entyvio (Vedolizumab) [id number] ?Route of submission (phone, fax, portal): PHONE: [phone] ?Auth type: Buy/Bill ?Units/visits requested: 300 MG ?Reference number: [id number] ?Approval from: 10/10/21 to 07/14/22  ? ?Patient will be scheduled as soon as possible

## 2021-10-11 NOTE — Telephone Encounter (Signed)
Great news ?He can stop Stelara once Entyvio infusions begin. ?Please let him know of approval and Dr. Estanislado Pandy is on board with this change ?Thanks ?JMP ? ?

## 2021-10-12 NOTE — Telephone Encounter (Signed)
The pt has been advised and agrees to the recommendation.   ?

## 2021-10-15 LAB — CALPROTECTIN, FECAL: Calprotectin, Fecal: 72 ug/g (ref 0–120)

## 2021-10-16 LAB — HEPATITIS B SURFACE ANTIGEN: Hepatitis B Surface Ag: NONREACTIVE

## 2021-10-16 LAB — QUANTIFERON-TB GOLD PLUS

## 2021-10-30 ENCOUNTER — Ambulatory Visit (INDEPENDENT_AMBULATORY_CARE_PROVIDER_SITE_OTHER): Payer: Medicare PPO

## 2021-10-30 VITALS — BP 156/79 | HR 54 | Temp 97.7°F | Resp 18 | Ht 71.0 in | Wt 226.2 lb

## 2021-10-30 DIAGNOSIS — K51011 Ulcerative (chronic) pancolitis with rectal bleeding: Secondary | ICD-10-CM | POA: Diagnosis not present

## 2021-10-30 MED ORDER — VEDOLIZUMAB 300 MG IV SOLR
300.0000 mg | Freq: Once | INTRAVENOUS | Status: AC
  Administered 2021-10-30: 300 mg via INTRAVENOUS
  Filled 2021-10-30: qty 5

## 2021-10-30 NOTE — Patient Instructions (Signed)
Vedolizumab Injection ?What is this medication? ?VEDOLIZUMAB (ve doe LIZ you mab) treats inflammatory bowel diseases. It works by decreasing inflammation in the digestive tract. ?This medicine may be used for other purposes; ask your health care provider or pharmacist if you have questions. ?COMMON BRAND NAME(S): Entyvio ?What should I tell my care team before I take this medication? ?They need to know if you have any of these conditions: ?Immune system problems ?Infection, such a tuberculosis (TB) or other bacterial, fungal, or viral infections ?Liver disease ?Recent or upcoming vaccine ?An unusual or allergic reaction to vedolizumab, other medications, foods, dyes, or preservatives ?Pregnant or trying to get pregnant ?Breast-feeding ?How should I use this medication? ?This medication is injected into a vein. It is given by your care team in a hospital or clinic setting. ?A special MedGuide will be given to you by the pharmacist with each prescription and refill. Be sure to read this information carefully each time. ?Talk to your care team about the use of this medication in children. It is not approved for use in children. ?Overdosage: If you think you have taken too much of this medicine contact a poison control center or emergency room at once. ?NOTE: This medicine is only for you. Do not share this medicine with others. ?What if I miss a dose? ?It is important not to miss your dose. Call your care team if you are unable to keep an appointment. ?What may interact with this medication? ?Steroid medications, such as prednisone or cortisone ?TNF-alpha inhibitors, such as natalizumab, adalimumab, and infliximab ?Vaccines ?This list may not describe all possible interactions. Give your health care provider a list of all the medicines, herbs, non-prescription drugs, or dietary supplements you use. Also tell them if you smoke, drink alcohol, or use illegal drugs. Some items may interact with your medicine. ?What should  I watch for while using this medication? ?Visit your care team for regular checks on your progress. Tell your care team if your symptoms do not start to get better or if they get worse. ?Stay away from people who are sick. Call your care team for advice if you get a fever, chills or sore throat, or other symptoms of a cold or flu. Do not treat yourself. ?In some patients, this medication may cause a serious brain infection that may cause death. If you have any problems seeing, thinking, speaking, walking, or standing, tell your care team right away. If you cannot reach your care team, get urgent medical care. ?What side effects may I notice from receiving this medication? ?Side effects that you should report to your care team as soon as possible: ?Allergic reactions--skin rash, itching, hives, swelling of the face, lips, tongue, or throat ?Dizziness, loss of balance or coordination, confusion or trouble speaking ?Infection--fever, chills, cough, or sore throat ?Infusion reactions--chest pain, shortness of breath or trouble breathing, feeling faint or lightheaded ?Liver injury--right upper belly pain, loss of appetite, nausea, light-colored stool, dark yellow or brown urine, yellowing skin or eyes, unusual weakness or fatigue ?Side effects that usually do not require medical attention (report to your care team if they continue or are bothersome): ?Headache ?Fatigue ?Joint pain ?Nausea ?This list may not describe all possible side effects. Call your doctor for medical advice about side effects. You may report side effects to FDA at 1-800-FDA-1088. ?Where should I keep my medication? ?This medication is given in a hospital or clinic and will not be stored at home. ?NOTE: This sheet is a summary. It  may not cover all possible information. If you have questions about this medicine, talk to your doctor, pharmacist, or health care provider. ?? 2023 Elsevier/Gold Standard (2021-04-12 00:00:00) ? ?

## 2021-10-30 NOTE — Progress Notes (Signed)
Diagnosis: Crohn's Disease ? ?Provider:  Marshell Garfinkel, MD ? ?Procedure: Infusion ? ?IV Type: Peripheral, IV Location: R Hand ? ?Entyvio (Vedolizumab), Dose: 300 mg ? ?Infusion Start Time: [phone] ? ?Infusion Stop Time: 1005 ? ?Post Infusion IV Care: Observation period completed and Peripheral IV Discontinued ? ?Discharge: Condition: Good, Destination: Home . AVS provided to patient.  ? ?Performed by:  London Nonaka, RN  ?  ?

## 2021-11-05 NOTE — Progress Notes (Signed)
? ?Office Visit Note ? ?Patient: Christopher Smith             ?Date of Birth: 13-Jul-1951           ?MRN: [id number]             ?PCP: Janie Morning, DO ?Referring: Janie Morning, DO ?Visit Date: 11/19/2021 ?Occupation: @GUAROCC @ ? ?Subjective:  ?[place]  ? ?History of Present Illness: Christopher Smith is a 71 y.o. male with history of seronegative rheumatoid arthritis, ulcerative colitis, gout, and osteoarthritis.  He remains on methotrexate 0.6 ml subcutaneous injections once weekly and folic acid 1 mg daily.  He was recently started on IV Entyvio x2 doses prescribed by Dr. Hilarie Fredrickson.  He continued to have persistent flares and active disease while on Stelara IV infusions.  He states that he is started to notice some improvement in his colitis since starting on Entyvio.  His last infusion was on 11/13/21.  ?He states that he continues to have persistent pain involving multiple joints especially his left shoulder, both hands, lower back, and both feet.  He states that his left shoulder wakes him up at night occasionally.  He states he experiences significant discomfort in his lower back and both feet after sitting for prolonged periods of time and for starting to walk.  He is not experiencing any symptoms of radiculopathy at this time.  He describes his overall generalized pain as an aching and soreness but denies any obvious joint swelling. ?He denies any recent infections. ?He denies any signs or symptoms of a gout flare.  He is clinically doing well taking allopurinol as prescribed.  ? ? ? ?Activities of Daily Living:  ?Patient reports morning stiffness for 1 minutes  ?Patient Reports nocturnal pain.  ?Difficulty dressing/grooming: Denies ?Difficulty climbing stairs: Reports ?Difficulty getting out of chair: Reports ?Difficulty using hands for taps, buttons, cutlery, and/or writing: Reports ? ?Review of Systems  ?Constitutional:  Positive for fatigue. Negative for night sweats.  ?HENT:  Negative for mouth sores,  mouth dryness and nose dryness.   ?Eyes:  Negative for redness and dryness.  ?Respiratory:  Negative for cough, hemoptysis, shortness of breath and difficulty breathing.   ?Cardiovascular:  Negative for chest pain, palpitations, hypertension, irregular heartbeat and swelling in legs/feet.  ?Gastrointestinal:  Positive for diarrhea. Negative for blood in stool and constipation.  ?Endocrine: Negative for increased urination.  ?Genitourinary:  Negative for painful urination.  ?Musculoskeletal:  Positive for joint pain, joint pain and morning stiffness. Negative for joint swelling, myalgias, muscle weakness, muscle tenderness and myalgias.  ?Skin:  Negative for color change, rash, hair loss, nodules/bumps, skin tightness, ulcers and sensitivity to sunlight.  ?Allergic/Immunologic: Negative for susceptible to infections.  ?Neurological:  Negative for dizziness, fainting, memory loss, night sweats and weakness.  ?Hematological:  Negative for swollen glands.  ?Psychiatric/Behavioral:  Negative for depressed mood and sleep disturbance. The patient is not nervous/anxious.   ? ?PMFS History:  ?Patient Active Problem List  ? Diagnosis Date Noted  ? Open fracture of one or more phalanges of hand 11/21/2017  ? Chronic inflammatory arthritis 08/05/2016  ? High risk medication use 08/05/2016  ? Contracture of elbow, right 08/05/2016  ? History of total knee replacement, bilateral 08/05/2016  ? Idiopathic chronic gout 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 tophus 08/05/2016  ? History of bilateral rotator cuff tear repair 08/05/2016  ? Gastroesophageal reflux  08/05/2016  ? Latent tuberculosis by blood test 08/05/2016  ? Ulcerative colitis 09/03/2011  ? Glucose intolerance (impaired glucose  tolerance) 09/03/2011  ? Severe diarrhea 09/03/2011  ? URI (upper respiratory infection) 07/02/2011  ? Bile salt-induced diarrhea 07/02/2011  ? S/P cholecystectomy 07/02/2011  ? Ulcerative colitis, unspecified 04/03/2011  ? Benign neoplasm of colon 04/03/2011  ?  Positive TB test 03/22/2011  ? Insomnia due to medical condition 03/22/2011  ? UC (ulcerative colitis) ([place]) 03/01/2011  ? Leg cramps 03/01/2011  ? Ulcerative colitis with rectal bleeding ([place]) 01/24/2011  ? Nonspecific abnormal finding in stool contents 01/24/2011  ? Heme positive stool 01/11/2011  ? Diarrhea 01/11/2011  ? Arthritis associated with inflammatory bowel disease 01/11/2011  ? Intentional weight loss 01/11/2011  ?  ?Past Medical History:  ?Diagnosis Date  ? Allergy   ? Anal fissure   ? Arthritis   ? RA  ? Blood transfusion without reported diagnosis   ? COVID-19 06/2020  ? Diabetes mellitus   ? no meds taken 05-01-16- elevated glucose was from predisone for UC  ? Diarrhea   ? Fatty liver   ? GERD (gastroesophageal reflux disease)   ? Gout   ? Hiatal hernia   ? Hypertension   ? Internal hemorrhoids   ? Polyarthritis   ? PONV (postoperative nausea and vomiting)   ? Sessile rectal polyp 03/27/2011  ? Sinusitis   ? Tuberculosis   ? positive PPD- had an allergic reaction to the PPD, gets yearly chest xray  ? Tubular adenoma of colon   ? Ulcerative colitis 01/24/2011  ?  ?Family History  ?Problem Relation Age of Onset  ? Alzheimer's disease Father   ? Stroke Mother   ? Cancer Mother   ?     liver & breast   ? Breast cancer Mother   ? Liver cancer Mother   ? Rheum arthritis Brother   ? Rheum arthritis Brother   ? Rheum arthritis Sister   ? Rheum arthritis Sister   ? Breast cancer Sister   ? Rheum arthritis Sister   ? Breast cancer Sister   ? Colon cancer Neg Hx   ? Esophageal cancer Neg Hx   ? Stomach cancer Neg Hx   ? Rectal cancer Neg Hx   ? Colon polyps Neg Hx   ? ?Past Surgical History:  ?Procedure Laterality Date  ? CHOLECYSTECTOMY    ? COLONOSCOPY    ? KNEE SURGERY    ? Bilateral   ? POLYPECTOMY    ? SHOULDER SURGERY Bilateral   ? Bilateral - rotator cuff repair   ? TOTAL KNEE ARTHROPLASTY Bilateral   ? ?Social History  ? ?Social History Narrative  ? 1 caffeine drink daily   ? ?Immunization History   ?Administered Date(s) Administered  ? Influenza, High Dose Seasonal PF 04/28/2019  ? Moderna Sars-Covid-2 Vaccination 09/08/2019, 10/06/2019  ? Tdap 11/19/2017  ?  ? ?Objective: ?Vital Signs: BP (!) 151/71 (BP Location: Right Arm, Patient Position: Sitting, Cuff Size: Large)   Pulse 60   Ht 5' 10"  (1.778 m)   Wt 227 lb 9.6 oz (103.2 kg)   BMI 32.66 kg/m?   ? ?Physical Exam ?Vitals and nursing note reviewed.  ?Constitutional:   ?   Appearance: He is well-developed.  ?HENT:  ?   Head: Normocephalic and atraumatic.  ?Eyes:  ?   Conjunctiva/sclera: Conjunctivae normal.  ?   Pupils: Pupils are equal, round, and reactive to light.  ?Cardiovascular:  ?   Rate and Rhythm: Normal rate and regular rhythm.  ?   Heart sounds: Normal heart sounds.  ?Pulmonary:  ?  Effort: Pulmonary effort is normal.  ?   Breath sounds: Normal breath sounds.  ?Abdominal:  ?   General: Bowel sounds are normal.  ?   Palpations: Abdomen is soft.  ?Musculoskeletal:  ?   Cervical back: Normal range of motion and neck supple.  ?Skin: ?   General: Skin is warm and dry.  ?   Capillary Refill: Capillary refill takes less than 2 seconds.  ?Neurological:  ?   Mental Status: He is alert and oriented to person, place, and time.  ?Psychiatric:     ?   Behavior: Behavior normal.  ?  ? ?Musculoskeletal Exam: C-spine has limited ROM. Painful ROM of lumbar spine.  No SI joint tenderness. Painful ROM of both shoulders.  Right elbow joint contracture noted.  Wrist joints have good range of motion with no tenderness or synovitis.  PIP and DIP thickening consistent with osteoarthritis of both hands. [place] joint prominence bilaterally.  Hip joints have good range of motion with no groin pain.  Bilateral knee replacements have good range of motion with no warmth or effusion.  Ankle joints have good range of motion with no tenderness or joint swelling. ? ?CDAI Exam: ?CDAI Score: -- ?Patient Global: --; Provider Global: -- ?Swollen: --; Tender: -- ?Joint Exam  11/19/2021  ? ?No joint exam has been documented for this visit  ? ?There is currently no information documented on the homunculus. Go to the Rheumatology activity and complete the homunculus joint exam. ? ?Investigation: ?No ad

## 2021-11-13 ENCOUNTER — Ambulatory Visit (INDEPENDENT_AMBULATORY_CARE_PROVIDER_SITE_OTHER): Payer: Medicare PPO

## 2021-11-13 VITALS — BP 167/79 | HR 56 | Temp 97.6°F | Resp 18 | Ht 71.0 in | Wt 225.6 lb

## 2021-11-13 DIAGNOSIS — K51011 Ulcerative (chronic) pancolitis with rectal bleeding: Secondary | ICD-10-CM

## 2021-11-13 MED ORDER — VEDOLIZUMAB 300 MG IV SOLR
300.0000 mg | Freq: Once | INTRAVENOUS | Status: AC
  Administered 2021-11-13: 300 mg via INTRAVENOUS
  Filled 2021-11-13: qty 5

## 2021-11-13 NOTE — Progress Notes (Signed)
Diagnosis: Ulcerative Pancolitis ? ?Provider:  Marshell Garfinkel, MD ? ?Procedure: Infusion ? ?IV Type: Peripheral, IV Location: L Antecubital ? ?Entyvio (Vedolizumab), Dose: 300 mg ? ?Infusion Start Time: 0908 ? ?Infusion Stop Time: 9136 ? ?Post Infusion IV Care: Peripheral IV Discontinued ? ?Discharge: Condition: Good, Destination: Home . AVS provided to patient.  ? ?Performed by:  Cleophus Molt, RN  ?  ?

## 2021-11-19 ENCOUNTER — Ambulatory Visit: Payer: Medicare PPO | Admitting: Physician Assistant

## 2021-11-19 ENCOUNTER — Encounter: Payer: Self-pay | Admitting: Physician Assistant

## 2021-11-19 VITALS — BP 151/71 | HR 60 | Ht 70.0 in | Wt 227.6 lb

## 2021-11-19 DIAGNOSIS — M1380 Other specified arthritis, unspecified site: Secondary | ICD-10-CM

## 2021-11-19 DIAGNOSIS — M545 Low back pain, unspecified: Secondary | ICD-10-CM

## 2021-11-19 DIAGNOSIS — Z79899 Other long term (current) drug therapy: Secondary | ICD-10-CM | POA: Diagnosis not present

## 2021-11-19 DIAGNOSIS — M12819 Other specific arthropathies, not elsewhere classified, unspecified shoulder: Secondary | ICD-10-CM | POA: Diagnosis not present

## 2021-11-19 DIAGNOSIS — K51818 Other ulcerative colitis with other complication: Secondary | ICD-10-CM

## 2021-11-19 DIAGNOSIS — M1A09X0 Idiopathic chronic gout, multiple sites, without tophus (tophi): Secondary | ICD-10-CM | POA: Diagnosis not present

## 2021-11-19 DIAGNOSIS — M24521 Contracture, right elbow: Secondary | ICD-10-CM

## 2021-11-19 DIAGNOSIS — Z96653 Presence of artificial knee joint, bilateral: Secondary | ICD-10-CM | POA: Diagnosis not present

## 2021-11-19 DIAGNOSIS — R7611 Nonspecific reaction to tuberculin skin test without active tuberculosis: Secondary | ICD-10-CM | POA: Diagnosis not present

## 2021-11-19 DIAGNOSIS — Z8719 Personal history of other diseases of the digestive system: Secondary | ICD-10-CM

## 2021-11-19 DIAGNOSIS — M19042 Primary osteoarthritis, left hand: Secondary | ICD-10-CM

## 2021-11-19 DIAGNOSIS — M19041 Primary osteoarthritis, right hand: Secondary | ICD-10-CM | POA: Diagnosis not present

## 2021-11-19 DIAGNOSIS — G8929 Other chronic pain: Secondary | ICD-10-CM

## 2021-11-20 LAB — CBC WITH DIFFERENTIAL/PLATELET
Absolute Monocytes: 694 cells/uL (ref 200–950)
Basophils Absolute: 29 cells/uL (ref 0–200)
Basophils Relative: 0.4 %
Eosinophils Absolute: 307 cells/uL (ref 15–500)
Eosinophils Relative: 4.2 %
HCT: 43.6 % (ref 38.5–50.0)
Hemoglobin: 14.5 g/dL (ref 13.2–17.1)
Lymphs Abs: 1869 cells/uL (ref 850–3900)
MCH: 31.9 pg (ref 27.0–33.0)
MCHC: 33.3 g/dL (ref 32.0–36.0)
MCV: 96 fL (ref 80.0–100.0)
MPV: 10.7 fL (ref 7.5–12.5)
Monocytes Relative: 9.5 %
Neutro Abs: 4402 cells/uL (ref 1500–7800)
Neutrophils Relative %: 60.3 %
Platelets: 287 10*3/uL (ref 140–400)
RBC: 4.54 10*6/uL (ref 4.20–5.80)
RDW: 14 % (ref 11.0–15.0)
Total Lymphocyte: 25.6 %
WBC: 7.3 10*3/uL (ref 3.8–10.8)

## 2021-11-20 LAB — COMPLETE METABOLIC PANEL WITH GFR
AG Ratio: 1.2 (calc) (ref 1.0–2.5)
ALT: 27 U/L (ref 9–46)
AST: 26 U/L (ref 10–35)
Albumin: 4 g/dL (ref 3.6–5.1)
Alkaline phosphatase (APISO): 47 U/L (ref 35–144)
BUN: 12 mg/dL (ref 7–25)
CO2: 28 mmol/L (ref 20–32)
Calcium: 9.1 mg/dL (ref 8.6–10.3)
Chloride: 101 mmol/L (ref 98–110)
Creat: 0.97 mg/dL (ref 0.70–1.28)
Globulin: 3.3 g/dL (calc) (ref 1.9–3.7)
Glucose, Bld: 113 mg/dL — ABNORMAL HIGH (ref 65–99)
Potassium: 4.1 mmol/L (ref 3.5–5.3)
Sodium: 137 mmol/L (ref 135–146)
Total Bilirubin: 0.7 mg/dL (ref 0.2–1.2)
Total Protein: 7.3 g/dL (ref 6.1–8.1)
eGFR: 83 mL/min/{1.73_m2} (ref >=60)

## 2021-11-20 LAB — URIC ACID: Uric Acid, Serum: 4.8 mg/dL (ref 4.0–8.0)

## 2021-11-20 NOTE — Progress Notes (Signed)
Glucose is 113. Rest of CMP WNL.  CBC WNL.  Uric acid WNL.

## 2021-12-11 ENCOUNTER — Ambulatory Visit (INDEPENDENT_AMBULATORY_CARE_PROVIDER_SITE_OTHER): Payer: Medicare PPO

## 2021-12-11 VITALS — BP 164/79 | HR 59 | Temp 97.7°F | Resp 16 | Ht 71.0 in | Wt 225.6 lb

## 2021-12-11 DIAGNOSIS — K51011 Ulcerative (chronic) pancolitis with rectal bleeding: Secondary | ICD-10-CM

## 2021-12-11 MED ORDER — VEDOLIZUMAB 300 MG IV SOLR
300.0000 mg | Freq: Once | INTRAVENOUS | Status: AC
  Administered 2021-12-11: 300 mg via INTRAVENOUS
  Filled 2021-12-11: qty 5

## 2021-12-11 NOTE — Progress Notes (Signed)
Diagnosis: Crohn's Disease  Provider:  Marshell Garfinkel, MD  Procedure: Infusion  IV Type: Peripheral, IV Location: L Antecubital  Entyvio (Vedolizumab), Dose: 300 mg  Infusion Start Time: 4034  Infusion Stop Time: 7425  Post Infusion IV Care: Peripheral IV Discontinued  Discharge: Condition: Good, Destination: Home . AVS provided to patient.   Performed by:  Koren Shiver, RN

## 2021-12-19 ENCOUNTER — Other Ambulatory Visit: Payer: Self-pay | Admitting: Physician Assistant

## 2021-12-19 NOTE — Telephone Encounter (Signed)
Next Visit: 05/09/2022  Last Visit: 11/19/2021  Last Fill: 09/18/2021  DX: Idiopathic chronic gout of multiple sites without tophus   Current Dose per office note 11/19/2021: allopurinol 300 mg 1 tablet by mouth daily for management of gout  Labs: 11/19/2021 Glucose is 113. Rest of CMP WNL.  CBC WNL.  Uric acid WNL.   Okay to refill Allopurinol?

## 2022-01-08 ENCOUNTER — Encounter: Payer: Self-pay | Admitting: Internal Medicine

## 2022-01-08 ENCOUNTER — Ambulatory Visit: Payer: Medicare PPO | Admitting: Internal Medicine

## 2022-01-08 VITALS — BP 124/80 | HR 67 | Ht 71.0 in | Wt 220.2 lb

## 2022-01-08 DIAGNOSIS — M0600 Rheumatoid arthritis without rheumatoid factor, unspecified site: Secondary | ICD-10-CM | POA: Diagnosis not present

## 2022-01-08 DIAGNOSIS — K5100 Ulcerative (chronic) pancolitis without complications: Secondary | ICD-10-CM

## 2022-01-08 DIAGNOSIS — K219 Gastro-esophageal reflux disease without esophagitis: Secondary | ICD-10-CM | POA: Diagnosis not present

## 2022-01-08 MED ORDER — COLESTIPOL HCL 1 G PO TABS: ORAL_TABLET | ORAL | 2 refills | Status: DC

## 2022-02-05 ENCOUNTER — Ambulatory Visit (INDEPENDENT_AMBULATORY_CARE_PROVIDER_SITE_OTHER): Payer: Medicare PPO

## 2022-02-05 VITALS — BP 146/75 | HR 52 | Temp 97.9°F | Resp 16 | Ht 71.0 in | Wt 224.4 lb

## 2022-02-05 DIAGNOSIS — K51011 Ulcerative (chronic) pancolitis with rectal bleeding: Secondary | ICD-10-CM | POA: Diagnosis not present

## 2022-02-05 MED ORDER — VEDOLIZUMAB 300 MG IV SOLR
300.0000 mg | Freq: Once | INTRAVENOUS | Status: AC
  Administered 2022-02-05: 300 mg via INTRAVENOUS
  Filled 2022-02-05: qty 5

## 2022-02-05 NOTE — Progress Notes (Signed)
Diagnosis: Crohn's Disease  Provider:  Marshell Garfinkel, MD  Procedure: Infusion  IV Type: Peripheral, IV Location: L Antecubital  Entyvio (Vedolizumab), Dose: 300 mg  Infusion Start Time: 0911  Infusion Stop Time: 0945  Post Infusion IV Care: Peripheral IV Discontinued  Discharge: Condition: Good, Destination: Home . AVS provided to patient.   Performed by:  Adelina Mings, LPN

## 2022-02-08 ENCOUNTER — Other Ambulatory Visit: Payer: Self-pay | Admitting: Internal Medicine

## 2022-02-08 ENCOUNTER — Telehealth: Payer: Self-pay

## 2022-02-08 DIAGNOSIS — K219 Gastro-esophageal reflux disease without esophagitis: Secondary | ICD-10-CM

## 2022-02-08 NOTE — Telephone Encounter (Signed)
Left message for patient to return call to schedule 3 month follow up with Dr Hilarie Fredrickson in September dx ulcerative colitis.  Will continue efforts.

## 2022-02-08 NOTE — Telephone Encounter (Signed)
Patient scheduled for 3 month follow up 10/6 at 8:30 am.

## 2022-02-26 DIAGNOSIS — N529 Male erectile dysfunction, unspecified: Secondary | ICD-10-CM | POA: Diagnosis not present

## 2022-02-26 DIAGNOSIS — M109 Gout, unspecified: Secondary | ICD-10-CM | POA: Diagnosis not present

## 2022-02-26 DIAGNOSIS — K219 Gastro-esophageal reflux disease without esophagitis: Secondary | ICD-10-CM | POA: Diagnosis not present

## 2022-02-26 DIAGNOSIS — E785 Hyperlipidemia, unspecified: Secondary | ICD-10-CM | POA: Diagnosis not present

## 2022-02-26 DIAGNOSIS — R7303 Prediabetes: Secondary | ICD-10-CM | POA: Diagnosis not present

## 2022-02-26 DIAGNOSIS — I10 Essential (primary) hypertension: Secondary | ICD-10-CM | POA: Diagnosis not present

## 2022-02-26 DIAGNOSIS — Z125 Encounter for screening for malignant neoplasm of prostate: Secondary | ICD-10-CM | POA: Diagnosis not present

## 2022-03-05 DIAGNOSIS — M1380 Other specified arthritis, unspecified site: Secondary | ICD-10-CM | POA: Diagnosis not present

## 2022-03-05 DIAGNOSIS — Z23 Encounter for immunization: Secondary | ICD-10-CM | POA: Diagnosis not present

## 2022-03-05 DIAGNOSIS — K219 Gastro-esophageal reflux disease without esophagitis: Secondary | ICD-10-CM | POA: Diagnosis not present

## 2022-03-05 DIAGNOSIS — R7303 Prediabetes: Secondary | ICD-10-CM | POA: Diagnosis not present

## 2022-03-05 DIAGNOSIS — I10 Essential (primary) hypertension: Secondary | ICD-10-CM | POA: Diagnosis not present

## 2022-03-05 DIAGNOSIS — E785 Hyperlipidemia, unspecified: Secondary | ICD-10-CM | POA: Diagnosis not present

## 2022-03-05 DIAGNOSIS — M109 Gout, unspecified: Secondary | ICD-10-CM | POA: Diagnosis not present

## 2022-03-05 DIAGNOSIS — Z0000 Encounter for general adult medical examination without abnormal findings: Secondary | ICD-10-CM | POA: Diagnosis not present

## 2022-04-02 ENCOUNTER — Ambulatory Visit (INDEPENDENT_AMBULATORY_CARE_PROVIDER_SITE_OTHER): Payer: Medicare PPO

## 2022-04-02 VITALS — BP 135/73 | HR 61 | Temp 97.7°F | Resp 18 | Ht 71.0 in | Wt 224.0 lb

## 2022-04-02 DIAGNOSIS — K51011 Ulcerative (chronic) pancolitis with rectal bleeding: Secondary | ICD-10-CM | POA: Diagnosis not present

## 2022-04-02 MED ORDER — VEDOLIZUMAB 300 MG IV SOLR
300.0000 mg | Freq: Once | INTRAVENOUS | Status: AC
  Administered 2022-04-02: 300 mg via INTRAVENOUS
  Filled 2022-04-02: qty 5

## 2022-04-02 NOTE — Progress Notes (Signed)
Diagnosis: Ulcerative Pancolitis  Provider:  Marshell Garfinkel MD  Procedure: Infusion  IV Type: Peripheral, IV Location: L Antecubital  Entyvio (Vedolizumab), Dose: 300 mg  Infusion Start Time: 0910  Infusion Stop Time: 5583  Post Infusion IV Care: Peripheral IV Discontinued  Discharge: Condition: Good, Destination: Home . AVS provided to patient.   Performed by:  Arnoldo Morale, RN

## 2022-04-19 ENCOUNTER — Other Ambulatory Visit: Payer: Medicare PPO

## 2022-04-19 ENCOUNTER — Ambulatory Visit: Payer: Medicare PPO | Admitting: Internal Medicine

## 2022-04-19 ENCOUNTER — Encounter: Payer: Self-pay | Admitting: Internal Medicine

## 2022-04-19 VITALS — BP 120/60 | HR 58 | Ht 71.0 in | Wt 225.0 lb

## 2022-04-19 DIAGNOSIS — K5100 Ulcerative (chronic) pancolitis without complications: Secondary | ICD-10-CM

## 2022-04-19 NOTE — Patient Instructions (Signed)
Your provider has requested that you go to the basement level for lab work before leaving today. Press "B" on the elevator. The lab is located at the first door on the left as you exit the elevator.  Increase your colestipol dosage to 2 tablets daily.  Be sure to discuss your continued joint pains with Dr Estanislado Pandy.  Please follow up with Dr Hilarie Fredrickson in the office in 4-6 months.  _______________________________________________________  If you are age 83 or older, your body mass index should be between 23-30. Your Body mass index is 31.38 kg/m. If this is out of the aforementioned range listed, please consider follow up with your Primary Care Provider.  If you are age 19 or younger, your body mass index should be between 19-25. Your Body mass index is 31.38 kg/m. If this is out of the aformentioned range listed, please consider follow up with your Primary Care Provider.   ________________________________________________________  The [facility] providers would like to encourage you to use [facility] to communicate with providers for non-urgent requests or questions.  Due to long hold times on the telephone, sending your provider a message by [facility] may be a faster and more efficient way to get a response.  Please allow 48 business hours for a response.  Please remember that this is for non-urgent requests.  _______________________________________________________  Due to recent changes in healthcare laws, you may see the results of your imaging and laboratory studies on MyChart before your provider has had a chance to review them.  We understand that in some cases there may be results that are confusing or concerning to you. Not all laboratory results come back in the same time frame and the provider may be waiting for multiple results in order to interpret others.  Please give [place] 48 hours in order for your provider to thoroughly review all the results before contacting the office for clarification of  your results.

## 2022-04-19 NOTE — Progress Notes (Signed)
Subjective:    Patient ID: Christopher Smith, male    DOB: 1951/06/03, 71 y.o.   MRN: [id number]  HPI Etai Copado is a 71 year old male with a history of longstanding pan ulcerative colitis (diagnosis 2011; previous treatment with infliximab, adalimumab, Cimzia and Stelara), currently on Entyvio since April 2023, seronegative arthritis, history of adenomatous polyps, GERD, latent TB having completed 8 months of isoniazid who is here for follow-up.  Last seen in the office on 01/08/22.  He reports he is doing fairly well.  From a bowel perspective he thinks things are slightly to a little bit better.  He is now down to 3-4 stools per day.  Can be loose but mostly formed.  Less urgency.  Still abdominal gas and lower cramping at times.  No blood in stool or mucus.  Continues Entyvio since April 2023.  With diet he eats "what ever I want".  Joints are still an issue having bilateral knee pain despite the fact they have been replaced 10 years ago.  Still issues with hand and wrist pain at times but biggest joint pain is lower back across the low back.  He tells me he was off methotrexate for just over a month for a dental procedure and could not really tell a difference in his joint symptoms.  He hopes to talk to Dr. Estanislado Pandy about this later this month.  Next [name] is scheduled for 05/28/2022.  He remains very active.  Appetite is very good and weight is stable.  Review of Systems As per HPI, otherwise negative  Current Medications, Allergies, Past Medical History, Past Surgical History, Family History and Social History were reviewed in [employer] record.    Objective:   Physical Exam BP 120/60   Pulse (!) 58   Ht 5' 11"  (1.803 m)   Wt 225 lb (102.1 kg)   BMI 31.38 kg/m  Gen: awake, alert, NAD HEENT: anicteric below Abd: soft, NT/ND, +BS throughout Ext: no c/c/e Neuro: nonfocal     Latest Ref Rng & Units 11/19/2021    8:35 AM 08/16/2021    4:13 PM 06/18/2021    10:08 AM  CBC  WBC 3.8 - 10.8 Thousand/uL 7.3  8.3  7.4   Hemoglobin 13.2 - 17.1 g/dL 14.5  14.6  15.5   Hematocrit 38.5 - 50.0 % 43.6  43.9  46.1   Platelets 140 - 400 Thousand/uL 287  255.0  262       Assessment & Plan:  71 year old male with a history of longstanding pan ulcerative colitis (diagnosis 2011; previous treatment with infliximab, adalimumab, Cimzia and Stelara), currently on Entyvio since April 2023, seronegative arthritis, history of adenomatous polyps, GERD, latent TB having completed 8 months of isoniazid who is here for follow-up.   Pan-ulcerative colitis (dx 2011, prior treatment with infliximab, adalimumab, Cimzia and Stelara; currently Entyvio since April 2023) --subjectively he is having less frequent stools but this may be a product of the colestipol.  I would like to objectively know disease activity by ordering fecal calprotectin.  We discussed again how [name] is got specific and if there is any inflammatory portion of his joint symptoms not addressed by methotrexate we could consider changing Entyvio and methotrexate to [employer] by itself.  He will discuss this further with Dr. Estanislado Pandy.  I pointed out to him that no medication for colitis will help osteoarthritis. -- Continue Entyvio 300 mg every 8 weeks with next dose in November -- Currently continue methotrexate per Dr.  Deveshwar but will discuss possible change in therapy including the possibility of Skyrizi depending on whether she feels there is an inflammatory nature to his joint symptoms -- Repeat fecal calprotecti  -- Increase colestipol to 2 g nightly Follow-up with me in 4 to 6 months  2.  GERD --well-controlled continue pantoprazole 40 mg daily  3.  Seronegative rheumatoid arthritis --as above, per #1  30 minutes total spent today including patient facing time, coordination of care, reviewing medical history/procedures/pertinent radiology studies, and documentation of the encounter.

## 2022-04-22 ENCOUNTER — Other Ambulatory Visit: Payer: Self-pay | Admitting: Physician Assistant

## 2022-04-22 NOTE — Telephone Encounter (Signed)
Next Visit: 05/09/2022   Last Visit: 11/19/2021   Last Fill: 04/24/2021  DX: Seronegative arthritis    Current Dose per office note 11/18/4933: folic acid 1 mg daily  Okay to refill Folic Acid?

## 2022-04-23 LAB — CALPROTECTIN, FECAL: Calprotectin, Fecal: 54 ug/g (ref 0–120)

## 2022-04-25 NOTE — Progress Notes (Signed)
Office Visit Note  Patient: Christopher Smith             Date of Birth: 1951-05-26           MRN: [id number]             PCP: Janie Morning, DO Referring: Janie Morning, DO Visit Date: 05/09/2022 Occupation: @GUAROCC @  Subjective:  Pain and stiffness in multiple joints.  History of Present Illness: Christopher Smith is a 71 y.o. male with seronegative inflammatory arthritis, ulcerative colitis, gout and osteoarthritis.  He states he has been off methotrexate for 2 months for tooth extraction.  He did not notice any difference in his symptoms.  He has been getting Entyvio infusions through Dr. Hilarie Fredrickson.  He continues to have flares of ulcerative colitis.  Having 5 loose stools a day.  Denies any blood in his stools.  He continues to have discomfort in his shoulders, hands, knee joints but he has not noticed any joint swelling.  There is no history of Planter fasciitis, Achilles tendinitis, sacroiliitis, uveitis or increased shortness of breath.  He has not had a gout flare.  He has been taking allopurinol on a regular basis. Patient was recently evaluated by Dr. Hilarie Fredrickson who recommended switching him to  [facility] and discontinuing Entyvio and methotrexate.  He has been off methotrexate for 2 months.  Activities of Daily Living:  Patient reports morning stiffness for 10-20 minutes.   Patient Reports nocturnal pain.  Difficulty dressing/grooming: Denies Difficulty climbing stairs: Denies Difficulty getting out of chair: Reports Difficulty using hands for taps, buttons, cutlery, and/or writing: Reports  Review of Systems  Constitutional:  Negative for fatigue.  HENT:  Negative for mouth sores and mouth dryness.   Eyes:  Positive for dryness.  Respiratory:  Negative for shortness of breath.   Cardiovascular:  Negative for chest pain and palpitations.  Gastrointestinal:  Positive for diarrhea. Negative for blood in stool and constipation.  Endocrine: Negative for increased urination.   Genitourinary:  Negative for involuntary urination.  Musculoskeletal:  Positive for joint pain, joint pain and morning stiffness. Negative for gait problem, joint swelling, myalgias, muscle weakness, muscle tenderness and myalgias.  Skin:  Negative for color change, rash and sensitivity to sunlight.  Allergic/Immunologic: Negative for susceptible to infections.  Neurological:  Positive for dizziness. Negative for headaches.  Hematological:  Negative for swollen glands.  Psychiatric/Behavioral:  Negative for depressed mood and sleep disturbance. The patient is not nervous/anxious.     PMFS History:  Patient Active Problem List   Diagnosis Date Noted   Open fracture of one or more phalanges of hand 11/21/2017   Chronic inflammatory arthritis 08/05/2016   High risk medication use 08/05/2016   Contracture of elbow, right 08/05/2016   History of total knee replacement, bilateral 08/05/2016   Idiopathic chronic gout 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 tophus 08/05/2016   History of bilateral rotator cuff tear repair 08/05/2016   Gastroesophageal reflux  08/05/2016   Latent tuberculosis by blood test 08/05/2016   Ulcerative colitis 09/03/2011   Glucose intolerance (impaired glucose tolerance) 09/03/2011   Severe diarrhea 09/03/2011   URI (upper respiratory infection) 07/02/2011   Bile salt-induced diarrhea 07/02/2011   S/P cholecystectomy 07/02/2011   Ulcerative colitis, unspecified 04/03/2011   Benign neoplasm of colon 04/03/2011   Positive TB test 03/22/2011   Insomnia due to medical condition 03/22/2011   UC (ulcerative colitis) ([place]) 03/01/2011   Leg cramps 03/01/2011   Ulcerative colitis with rectal bleeding ([place]) 01/24/2011  Nonspecific abnormal finding in stool contents 01/24/2011   Heme positive stool 01/11/2011   Diarrhea 01/11/2011   Arthritis associated with inflammatory bowel disease 01/11/2011   Intentional weight loss 01/11/2011    Past Medical History:  Diagnosis Date   Allergy     Anal fissure    Arthritis    RA   Blood transfusion without reported diagnosis    COVID-19 06/2020   Diabetes mellitus    no meds taken 05-01-16- elevated glucose was from [facility] for UC   Diarrhea    Fatty liver    GERD (gastroesophageal reflux disease)    Gout    Hiatal hernia    Hypertension    Internal hemorrhoids    Polyarthritis    PONV (postoperative nausea and vomiting)    Sessile rectal polyp 03/27/2011   Sinusitis    Tuberculosis    positive PPD- had an allergic reaction to the PPD, gets yearly chest xray   Tubular adenoma of colon    Ulcerative colitis 01/24/2011    Family History  Problem Relation Age of Onset   Alzheimer's disease Father    Stroke Mother    Cancer Mother        liver & breast    Breast cancer Mother    Liver cancer Mother    Rheum arthritis Brother    Rheum arthritis Brother    Rheum arthritis Sister    Rheum arthritis Sister    Breast cancer Sister    Rheum arthritis Sister    Breast cancer Sister    Colon cancer Neg Hx    Esophageal cancer Neg Hx    Stomach cancer Neg Hx    Rectal cancer Neg Hx    Colon polyps Neg Hx    Past Surgical History:  Procedure Laterality Date   CHOLECYSTECTOMY     COLONOSCOPY     KNEE SURGERY     Bilateral    POLYPECTOMY     SHOULDER SURGERY Bilateral    Bilateral - rotator cuff repair    TOTAL KNEE ARTHROPLASTY Bilateral    Social History   Social History Narrative   1 caffeine drink daily       2 sons and a step daughter   Immunization History  Administered Date(s) Administered   Influenza, High Dose Seasonal PF 04/28/2019   Moderna Sars-Covid-2 Vaccination 09/08/2019, 10/06/2019   Tdap 11/19/2017     Objective: Vital Signs: BP (!) 148/84 (BP Location: Left Arm, Patient Position: Sitting, Cuff Size: Normal)   Pulse (!) 59   Resp 16   Ht 5' 11"  (1.803 m)   Wt 221 lb 9.6 oz (100.5 kg)   BMI 30.91 kg/m    Physical Exam Vitals and nursing note reviewed.  Constitutional:       Appearance: He is well-developed.  HENT:     Head: Normocephalic and atraumatic.  Eyes:     Conjunctiva/sclera: Conjunctivae normal.     Pupils: Pupils are equal, round, and reactive to light.  Cardiovascular:     Rate and Rhythm: Normal rate and regular rhythm.     Heart sounds: Normal heart sounds.  Pulmonary:     Effort: Pulmonary effort is normal.     Breath sounds: Normal breath sounds.  Abdominal:     General: Bowel sounds are normal.     Palpations: Abdomen is soft.     Comments: Abdominal hernia noted.  Musculoskeletal:     Cervical back: Normal range of motion and neck supple.  Skin:    General: Skin is warm and dry.     Capillary Refill: Capillary refill takes less than 2 seconds.  Neurological:     Mental Status: He is alert and oriented to person, place, and time.  Psychiatric:        Behavior: Behavior normal.      Musculoskeletal Exam: He had limited range of motion of the cervical spine.  He had discomfort range of motion of the lumbar spine.  Shoulder joints were in good range of motion.  He had right elbow joint contracture without synovitis.  Left elbow joint was in good range of motion.  He had good range of motion of bilateral wrist joints.  PIP and DIP thickening with no synovitis was noted.  Hip joints were in good range of motion.  Bilateral knee joints were replaced and had effusion.  There was no tenderness over ankles or MTPs.  Planter fasciitis or Achilles tendinitis was noted.  CDAI Exam: CDAI Score: -- Patient Global: --; Provider Global: -- Swollen: --; Tender: -- Joint Exam 05/09/2022   No joint exam has been documented for this visit   There is currently no information documented on the homunculus. Go to the Rheumatology activity and complete the homunculus joint exam.  Investigation: No additional findings.  Imaging: No results found.  Recent Labs: Lab Results  Component Value Date   WBC 7.3 11/19/2021   HGB 14.5 11/19/2021   PLT 287  11/19/2021   NA 137 11/19/2021   K 4.1 11/19/2021   CL 101 11/19/2021   CO2 28 11/19/2021   GLUCOSE 113 (H) 11/19/2021   BUN 12 11/19/2021   CREATININE 0.97 11/19/2021   BILITOT 0.7 11/19/2021   ALKPHOS 65 11/19/2016   AST 26 11/19/2021   ALT 27 11/19/2021   PROT 7.3 11/19/2021   ALBUMIN 4.4 11/19/2016   CALCIUM 9.1 11/19/2021   GFRAA 103 01/16/2021   QFTBGOLDPLUS CANCELED 10/10/2021    Speciality Comments: Last Dexa Scan 03/21/2020  Prior therapy: Humira, Remicaide, Cimzia, Stelara (inadequate response)  Procedures:  No procedures performed Allergies: Patient has no known allergies.   Assessment / Plan:     Visit Diagnoses: Chronic inflammatory arthritis - Inflammatory, erosive: Patient had no synovitis on my examination.  He continues to have some effusion in his bilateral knee joints which are replaced.  He had been off methotrexate for the last 2 months.  He stopped methotrexate for dental procedure and never restarted.  He has not noticed any difference coming off methotrexate.  High risk medication use -(methotrexate 0.6 ml subcutaneous injections once weekly, folic acid 1 mg daily-discontinued 2 months ago.)  Entyvio IV infusions prescribed by GI.  - Plan: CBC with Differential/Platelet, COMPLETE METABOLIC PANEL WITH GFR, DG Chest 2 View.  Labs from Nov 19, 2021 CBC and CMP were normal.  We will get labs today and every 3 months to monitor for drug toxicity.  Information on immunization was placed in the AVS.  He was advised to stop medication if he develops an infection and resume after the infection resolves.  Positive TB test - 2011.  CXR on 02/02/21 was stable with no active cardiopulmonary process.  He will continue to require yearly CXR.  I have placed an order for chest x-ray which patient plans to get in the near future.  Other ulcerative colitis with other complication ([place]) - Diagnosed in 2011.  Followed closely by Dr. Hilarie Fredrickson.  He is currently on Entyvio infusions.  He  has  failed Humira, Remicade, Cimzia, Stelara in the past.  He continues to have 5 loose stools daily.  Dr. Hilarie Fredrickson suggested switching him to his to [facility] infusions, I am in agreement.  Patient will contact Dr. Hilarie Fredrickson to his switch to [facility].  Idiopathic chronic gout of multiple sites without tophus - allopurinol 300 mg 1 tablet by mouth daily for management of gout. uric acid: 4.8 on 11/19/2021.  He has not had a gout flare in a long time.  His uric acid is in desirable range.  He will continue current dose of allopurinol.  History of bilateral rotator cuff tear repair-he can use to have some stiffness in his shoulders.  Contracture of right elbow-unchanged without any synovitis.  Primary osteoarthritis of both hands-he had been lateral PIP and DIP thickening with no synovitis.  History of total bilateral knee replacement-he is followed by Dr. Lorre Nick.  He had effusion in his bilateral knee joints which are unchanged.  Neck pain-he had limitation with range of motion of the cervical spine and some stiffness.  I gave him handout on neck exercises.  Chronic midline low back pain without sciatica-he continues to have lower back pain.  I gave him a handout on lower back exercises.  History of gastroesophageal reflux (GERD)  Orders: Orders Placed This Encounter  Procedures   DG Chest 2 View   CBC with Differential/Platelet   COMPLETE METABOLIC PANEL WITH GFR   Uric acid   No orders of the defined types were placed in this encounter.    Follow-Up Instructions: Return in about 5 months (around 10/08/2022) for Inflammatory arthritis, UC.   Bo Merino, MD  Note - This record has been created using Editor, commissioning.  Chart creation errors have been sought, but may not always  have been located. Such creation errors do not reflect on  the standard of medical care.

## 2022-05-09 ENCOUNTER — Encounter: Payer: Self-pay | Admitting: Rheumatology

## 2022-05-09 ENCOUNTER — Ambulatory Visit: Payer: Medicare PPO | Attending: Rheumatology | Admitting: Rheumatology

## 2022-05-09 VITALS — BP 148/84 | HR 59 | Resp 16 | Ht 71.0 in | Wt 221.6 lb

## 2022-05-09 DIAGNOSIS — K51818 Other ulcerative colitis with other complication: Secondary | ICD-10-CM

## 2022-05-09 DIAGNOSIS — M1A09X0 Idiopathic chronic gout, multiple sites, without tophus (tophi): Secondary | ICD-10-CM | POA: Diagnosis not present

## 2022-05-09 DIAGNOSIS — M12819 Other specific arthropathies, not elsewhere classified, unspecified shoulder: Secondary | ICD-10-CM

## 2022-05-09 DIAGNOSIS — M545 Low back pain, unspecified: Secondary | ICD-10-CM

## 2022-05-09 DIAGNOSIS — Z96653 Presence of artificial knee joint, bilateral: Secondary | ICD-10-CM | POA: Diagnosis not present

## 2022-05-09 DIAGNOSIS — Z79899 Other long term (current) drug therapy: Secondary | ICD-10-CM | POA: Diagnosis not present

## 2022-05-09 DIAGNOSIS — M19042 Primary osteoarthritis, left hand: Secondary | ICD-10-CM

## 2022-05-09 DIAGNOSIS — M1990 Unspecified osteoarthritis, unspecified site: Secondary | ICD-10-CM | POA: Diagnosis not present

## 2022-05-09 DIAGNOSIS — M24521 Contracture, right elbow: Secondary | ICD-10-CM | POA: Diagnosis not present

## 2022-05-09 DIAGNOSIS — M1380 Other specified arthritis, unspecified site: Secondary | ICD-10-CM

## 2022-05-09 DIAGNOSIS — Z8719 Personal history of other diseases of the digestive system: Secondary | ICD-10-CM

## 2022-05-09 DIAGNOSIS — G8929 Other chronic pain: Secondary | ICD-10-CM

## 2022-05-09 DIAGNOSIS — M19041 Primary osteoarthritis, right hand: Secondary | ICD-10-CM | POA: Diagnosis not present

## 2022-05-09 DIAGNOSIS — R7611 Nonspecific reaction to tuberculin skin test without active tuberculosis: Secondary | ICD-10-CM

## 2022-05-09 DIAGNOSIS — M542 Cervicalgia: Secondary | ICD-10-CM

## 2022-05-09 LAB — CBC WITH DIFFERENTIAL/PLATELET
Absolute Monocytes: 657 cells/uL (ref 200–950)
Basophils Absolute: 54 cells/uL (ref 0–200)
Basophils Relative: 0.8 %
Eosinophils Absolute: 261 cells/uL (ref 15–500)
Eosinophils Relative: 3.9 %
HCT: 43.8 % (ref 38.5–50.0)
Hemoglobin: 14.7 g/dL (ref 13.2–17.1)
Lymphs Abs: 1702 cells/uL (ref 850–3900)
MCH: 30.7 pg (ref 27.0–33.0)
MCHC: 33.6 g/dL (ref 32.0–36.0)
MCV: 91.4 fL (ref 80.0–100.0)
MPV: 10.8 fL (ref 7.5–12.5)
Monocytes Relative: 9.8 %
Neutro Abs: 4027 cells/uL (ref 1500–7800)
Neutrophils Relative %: 60.1 %
Platelets: 267 10*3/uL (ref 140–400)
RBC: 4.79 10*6/uL (ref 4.20–5.80)
RDW: 14.4 % (ref 11.0–15.0)
Total Lymphocyte: 25.4 %
WBC: 6.7 10*3/uL (ref 3.8–10.8)

## 2022-05-09 LAB — COMPLETE METABOLIC PANEL WITH GFR
AG Ratio: 1.5 (calc) (ref 1.0–2.5)
ALT: 25 U/L (ref 9–46)
AST: 26 U/L (ref 10–35)
Albumin: 4.5 g/dL (ref 3.6–5.1)
Alkaline phosphatase (APISO): 51 U/L (ref 35–144)
BUN: 11 mg/dL (ref 7–25)
CO2: 29 mmol/L (ref 20–32)
Calcium: 9.3 mg/dL (ref 8.6–10.3)
Chloride: 102 mmol/L (ref 98–110)
Creat: 0.98 mg/dL (ref 0.70–1.28)
Globulin: 3.1 g/dL (calc) (ref 1.9–3.7)
Glucose, Bld: 105 mg/dL — ABNORMAL HIGH (ref 65–99)
Potassium: 4.6 mmol/L (ref 3.5–5.3)
Sodium: 137 mmol/L (ref 135–146)
Total Bilirubin: 1 mg/dL (ref 0.2–1.2)
Total Protein: 7.6 g/dL (ref 6.1–8.1)
eGFR: 82 mL/min/{1.73_m2} (ref >=60)

## 2022-05-09 LAB — URIC ACID: Uric Acid, Serum: 3.8 mg/dL — ABNORMAL LOW (ref 4.0–8.0)

## 2022-05-09 NOTE — Patient Instructions (Signed)
Standing Labs We placed an order today for your standing lab work.   Please have your standing labs drawn in January and every 3 months  Please have your labs drawn 2 weeks prior to your appointment so that the provider can discuss your lab results at your appointment.  Please note that you may see your imaging and lab results in [facility] before we have reviewed them. We will contact you once all results are reviewed. Please allow our office up to 72 hours to thoroughly review all of the results before contacting the office for clarification of your results.  Lab hours are:   Monday through Thursday from 8:00 am -12:30 pm and 1:00 pm-5:00 pm and Friday from 8:00 am-12:00 pm.  Please be advised, all patients with office appointments requiring lab work will take precedent over walk-in lab work.   Labs are drawn by [facility]. Please bring your co-pay at the time of your lab draw.  You may receive a bill from [facility] for your lab work.  Please note if you are on Hydroxychloroquine and and an order has been placed for a Hydroxychloroquine level, you will need to have it drawn 4 hours or more after your last dose.  If you wish to have your labs drawn at another location, please call the office 24 hours in advance so we can fax the orders.  The office is located at [address], [place], [place], [place] [address] No appointment is necessary.    If you have any questions regarding directions or hours of operation,  please call [phone].   As a reminder, please drink plenty of water prior to coming for your lab work. Thanks!   Vaccines You are taking a medication(s) that can suppress your immune system.  The following immunizations are recommended: Flu annually Covid-19  Td/Tdap (tetanus, diphtheria, pertussis) every 10 years Pneumonia (Prevnar 15 then Pneumovax 23 at least 1 year apart.  Alternatively, can take Prevnar 20 without needing additional dose) Shingrix: 2 doses from 4 weeks  to 6 months apart  Please check with your PCP to make sure you are up to date.   If you have signs or symptoms of an infection or start antibiotics: First, call your PCP for workup of your infection. Hold your medication through the infection, until you complete your antibiotics, and until symptoms resolve if you take the following: Injectable medication (Actemra, Benlysta, Cimzia, Cosentyx, Enbrel, Humira, Kevzara, Orencia, Remicade, Simponi, Stelara, Taltz, Tremfya) Methotrexate Leflunomide (Arava) Mycophenolate (Cellcept) Morrie Sheldon, Olumiant, or Rinvoq  Back Exercises The following exercises strengthen the muscles that help to support the trunk (torso) and back. They also help to keep the lower back flexible. Doing these exercises can help to prevent or lessen existing low back pain. If you have back pain or discomfort, try doing these exercises 2-3 times each day or as told by your health care provider. As your pain improves, do them once each day, but increase the number of times that you repeat the steps for each exercise (do more repetitions). To prevent the recurrence of back pain, continue to do these exercises once each day or as told by your health care provider. Do exercises exactly as told by your health care provider and adjust them as directed. It is normal to feel mild stretching, pulling, tightness, or discomfort as you do these exercises, but you should stop right away if you feel sudden pain or your pain gets worse. Exercises Single knee to chest Repeat these steps  3-5 times for each leg: Lie on your back on a firm bed or the floor with your legs extended. Bring one knee to your chest. Your other leg should stay extended and in contact with the floor. Hold your knee in place by grabbing your knee or thigh with both hands and hold. Pull on your knee until you feel a gentle stretch in your lower back or buttocks. Hold the stretch for 10-30 seconds. Slowly release and  straighten your leg.  Pelvic tilt Repeat these steps 5-10 times: Lie on your back on a firm bed or the floor with your legs extended. Bend your knees so they are pointing toward the ceiling and your feet are flat on the floor. Tighten your lower abdominal muscles to press your lower back against the floor. This motion will tilt your pelvis so your tailbone points up toward the ceiling instead of pointing to your feet or the floor. With gentle tension and even breathing, hold this position for 5-10 seconds.  Cat-cow Repeat these steps until your lower back becomes more flexible: Get into a hands-and-knees position on a firm bed or the floor. Keep your hands under your shoulders, and keep your knees under your hips. You may place padding under your knees for comfort. Let your head hang down toward your chest. Contract your abdominal muscles and point your tailbone toward the floor so your lower back becomes rounded like the back of a cat. Hold this position for 5 seconds. Slowly lift your head, let your abdominal muscles relax, and point your tailbone up toward the ceiling so your back forms a sagging arch like the back of a cow. Hold this position for 5 seconds.  Press-ups Repeat these steps 5-10 times: Lie on your abdomen (face-down) on a firm bed or the floor. Place your palms near your head, about shoulder-width apart. Keeping your back as relaxed as possible and keeping your hips on the floor, slowly straighten your arms to raise the top half of your body and lift your shoulders. Do not use your back muscles to raise your upper torso. You may adjust the placement of your hands to make yourself more comfortable. Hold this position for 5 seconds while you keep your back relaxed. Slowly return to lying flat on the floor.  Bridges Repeat these steps 10 times: Lie on your back on a firm bed or the floor. Bend your knees so they are pointing toward the ceiling and your feet are flat on the  floor. Your arms should be flat at your sides, next to your body. Tighten your buttocks muscles and lift your buttocks off the floor until your waist is at almost the same height as your knees. You should feel the muscles working in your buttocks and the back of your thighs. If you do not feel these muscles, slide your feet 1-2 inches (2.5-5 cm) farther away from your buttocks. Hold this position for 3-5 seconds. Slowly lower your hips to the starting position, and allow your buttocks muscles to relax completely. If this exercise is too easy, try doing it with your arms crossed over your chest. Abdominal crunches Repeat these steps 5-10 times: Lie on your back on a firm bed or the floor with your legs extended. Bend your knees so they are pointing toward the ceiling and your feet are flat on the floor. Cross your arms over your chest. Tip your chin slightly toward your chest without bending your neck. Tighten your abdominal muscles and slowly  raise your torso high enough to lift your shoulder blades a tiny bit off the floor. Avoid raising your torso higher than that because it can put too much stress on your lower back and does not help to strengthen your abdominal muscles. Slowly return to your starting position.  Back lifts Repeat these steps 5-10 times: Lie on your abdomen (face-down) with your arms at your sides, and rest your forehead on the floor. Tighten the muscles in your legs and your buttocks. Slowly lift your chest off the floor while you keep your hips pressed to the floor. Keep the back of your head in line with the curve in your back. Your eyes should be looking at the floor. Hold this position for 3-5 seconds. Slowly return to your starting position.  Contact a health care provider if: Your back pain or discomfort gets much worse when you do an exercise. Your worsening back pain or discomfort does not lessen within 2 hours after you exercise. If you have any of these  problems, stop doing these exercises right away. Do not do them again unless your health care provider says that you can. Get help right away if: You develop sudden, severe back pain. If this happens, stop doing the exercises right away. Do not do them again unless your health care provider says that you can. This information is not intended to replace advice given to you by your health care provider. Make sure you discuss any questions you have with your health care provider. Document Revised: 12/26/2020 Document Reviewed: 09/13/2020 Elsevier Patient Education  [place].  Cervical Strain and Sprain Rehab Ask your health care provider which exercises are safe for you. Do exercises exactly as told by your health care provider and adjust them as directed. It is normal to feel mild stretching, pulling, tightness, or discomfort as you do these exercises. Stop right away if you feel sudden pain or your pain gets worse. Do not begin these exercises until told by your health care provider. Stretching and range-of-motion exercises Cervical side bending  Using good posture, sit on a stable chair or stand up. Without moving your shoulders, slowly tilt your left / right ear to your shoulder until you feel a stretch in the neck muscles on the opposite side. You should be looking straight ahead. Hold for __________ seconds. Repeat with the other side of your neck. Repeat __________ times. Complete this exercise __________ times a day. Cervical rotation  Using good posture, sit on a stable chair or stand up. Slowly turn your head to the side as if you are looking over your left / right shoulder. Keep your eyes level with the ground. Stop when you feel a stretch along the side and the back of your neck. Hold for __________ seconds. Repeat this by turning to your other side. Repeat __________ times. Complete this exercise __________ times a day. Thoracic extension and pectoral stretch  Roll a  towel or a small blanket so it is about 4 inches (10 cm) in diameter. Lie down on your back on a firm surface. Put the towel in the middle of your back across your spine. It should not be under your shoulder blades. Put your hands behind your head and let your elbows fall out to your sides. Hold for __________ seconds. Repeat __________ times. Complete this exercise __________ times a day. Strengthening exercises Upper cervical flexion  Lie on your back with a thin pillow behind your head or a small, rolled-up towel  under your neck. Gently tuck your chin toward your chest and nod your head down to look toward your feet. Do not lift your head off the pillow. Hold for __________ seconds. Release the tension slowly. Relax your neck muscles completely before you repeat this exercise. Repeat __________ times. Complete this exercise __________ times a day. Cervical extension  Stand about 6 inches (15 cm) away from a wall, with your back facing the wall. Place a soft object, about 6-8 inches (15-20 cm) in diameter, between the back of your head and the wall. A soft object could be a small pillow, a ball, or a folded towel. Gently tilt your head back and press into the soft object. Keep your jaw and forehead relaxed. Hold for __________ seconds. Release the tension slowly. Relax your neck muscles completely before you repeat this exercise. Repeat __________ times. Complete this exercise __________ times a day. Posture and body mechanics Body mechanics refer to the movements and positions of your body while you do your daily activities. Posture is part of body mechanics. Good posture and healthy body mechanics can help to relieve stress in your body's tissues and joints. Good posture means that your spine is in its natural S-curve position (your spine is neutral), your shoulders are pulled back slightly, and your head is not tipped forward. The following are general guidelines for using improved  posture and body mechanics in your everyday activities. Sitting  When sitting, keep your spine neutral and keep your feet flat on the floor. Use a footrest, if needed, and keep your thighs parallel to the floor. Avoid rounding your shoulders. Avoid tilting your head forward. When working at a desk or a computer, keep your desk at a height where your hands are slightly lower than your elbows. Slide your chair under your desk so you are close enough to maintain good posture. When working at a computer, place your monitor at a height where you are looking straight ahead and you do not have to tilt your head forward or downward to look at the screen. Standing  When standing, keep your spine neutral and keep your feet about hip-width apart. Keep a slight bend in your knees. Your ears, shoulders, and hips should line up. When you do a task in which you stand in one place for a long time, place one foot up on a stable object that is 2-4 inches (5-10 cm) high, such as a footstool. This helps keep your spine neutral. Resting When lying down and resting, avoid positions that are most painful for you. Try to support your neck in a neutral position. You can use a contour pillow or a small rolled-up towel. Your pillow should support your neck but not push on it. This information is not intended to replace advice given to you by your health care provider. Make sure you discuss any questions you have with your health care provider. Document Revised: 01/21/2022 Document Reviewed: 01/21/2022 Elsevier Patient Education  [place].

## 2022-05-10 NOTE — Progress Notes (Signed)
CBC and CMP are normal.  Uric acid is in the desirable range.

## 2022-05-11 ENCOUNTER — Encounter: Payer: Self-pay | Admitting: Internal Medicine

## 2022-05-11 ENCOUNTER — Other Ambulatory Visit: Payer: Self-pay | Admitting: Internal Medicine

## 2022-05-11 NOTE — Progress Notes (Signed)
Note reviewed. Dr. Estanislado Pandy is in agreement with switching Entyvio to [place].  The patient has been off of methotrexate.  He has ulcerative colitis with persistent symptoms and a borderline fecal calprotectin.  He also has inflammatory arthropathy. Orson Ape will hopefully be effective for both  We will start with standard induction and then 360 mg injection every 8 weeks  Stop Entyvio and I will place orders for Skyrizi at the infusion center

## 2022-05-11 NOTE — Progress Notes (Signed)
Plan for 360 mg SQ for maintenance once home injection starts

## 2022-05-13 ENCOUNTER — Telehealth: Payer: Self-pay | Admitting: Pharmacy Technician

## 2022-05-13 ENCOUNTER — Encounter: Payer: Self-pay | Admitting: Internal Medicine

## 2022-05-13 ENCOUNTER — Other Ambulatory Visit (HOSPITAL_COMMUNITY): Payer: Self-pay

## 2022-05-13 NOTE — Telephone Encounter (Addendum)
Dr. Hilarie Fredrickson, Juluis Rainier note:  Auth Submission: approved Patient will be scheduled as soon as possible.  Payer: [employer] Medication & CPT/J Code(s) submitted: Orson Ape [employer]) [phone] Route of submission (phone, fax, portal):  Phone [phone] Fax [phone] Auth type: Buy/Bill Units/visits requested: X3 Reference number: [id number] Approval from:  08/06/22 -07/15/23

## 2022-05-13 NOTE — Progress Notes (Signed)
Monchell pt will get the skyrizi at [facility] infusion center for the induction. He will do the injections at home. He will need authorization for the injections of 355m every 8 weeks once the induction is done.

## 2022-05-14 NOTE — Telephone Encounter (Signed)
Let patient know that I put in orders for Skyrizi over the weekend or either late last week We should hear from the infusion center soon I think Christopher Smith can start without him needing another Entyvio infusion, therefore hold the next Entyvio infusion at least until we hear back from [name]

## 2022-05-20 ENCOUNTER — Other Ambulatory Visit: Payer: Self-pay | Admitting: Internal Medicine

## 2022-05-20 ENCOUNTER — Other Ambulatory Visit: Payer: Self-pay | Admitting: Physician Assistant

## 2022-05-20 DIAGNOSIS — K219 Gastro-esophageal reflux disease without esophagitis: Secondary | ICD-10-CM

## 2022-05-20 NOTE — Telephone Encounter (Signed)
Next Visit: 10/08/2022  Last Visit: 05/09/2022  Last Fill: 12/19/2021  DX: Idiopathic chronic gout of multiple sites without tophus   Current Dose per office note 05/09/2022: allopurinol 300 mg 1 tablet by mouth daily for management of gout.   Labs: 05/09/2022 CBC and CMP are normal.  Uric acid is in the desirable range.   Okay to refill Allopurinol?

## 2022-05-28 ENCOUNTER — Ambulatory Visit: Payer: Medicare PPO

## 2022-06-03 ENCOUNTER — Ambulatory Visit (HOSPITAL_COMMUNITY)
Admission: RE | Admit: 2022-06-03 | Discharge: 2022-06-03 | Disposition: A | Discharge status: Home / Self Care | Payer: Medicare PPO | Source: Ambulatory Visit | Attending: Rheumatology | Admitting: Rheumatology

## 2022-06-03 DIAGNOSIS — Z79899 Other long term (current) drug therapy: Secondary | ICD-10-CM | POA: Insufficient documentation

## 2022-06-03 DIAGNOSIS — R7612 Nonspecific reaction to cell mediated immunity measurement of gamma interferon antigen response without active tuberculosis: Secondary | ICD-10-CM | POA: Diagnosis not present

## 2022-06-04 NOTE — Progress Notes (Signed)
Normal chest x-ray per radiology report.

## 2022-06-12 NOTE — Telephone Encounter (Signed)
I called and spoke to several agents with [employer] Apparently the second level appeal was handled by [employer] A third level appeal can also be submitted to [employer] for a independent clinical review Christopher Smith is off label for ulcerative colitis however it is expected to be approved soon.  This patient has tried and failed infliximab, adalimumab, Cimzia, Stelara, and Entyvio He also has rheumatoid arthritis and is treated by Dr. Estanislado Pandy Appeal requested because of limited availability of other biologic options to treat this patient with active colitis Can you please submit third level appeal to [employer] Thank you  [name] can you please let the patient know that we are continuing to work to try to get Christopher Smith approved on his behalf but so far has not reached insurance problems

## 2022-06-12 NOTE — Telephone Encounter (Signed)
Spoke with pt and he is aware. Monchell please see the note below from Dr. Hilarie Fredrickson regarding a third level appeal.

## 2022-06-14 NOTE — Telephone Encounter (Signed)
Called and spoke with several representatives with [employer]. No representatives were able to provide a number for [employer] which is the independent reviewer. Since is has been 24 days since last response (denial letter 11.7.23) it has been escalated for further review and should have a response for next steps within 24-48 hours. This did include weekends. I asked the representative [facility] R. G#[phone]) to change the start time to Monday 12.4.23 and was able to do that. The expedited Reference number is [id number].

## 2022-07-02 ENCOUNTER — Encounter: Payer: Self-pay | Admitting: Internal Medicine

## 2022-07-05 ENCOUNTER — Other Ambulatory Visit (HOSPITAL_COMMUNITY): Payer: Self-pay

## 2022-07-23 ENCOUNTER — Other Ambulatory Visit (HOSPITAL_COMMUNITY): Payer: Self-pay

## 2022-07-23 ENCOUNTER — Telehealth: Payer: Self-pay | Admitting: Pharmacy Technician

## 2022-07-23 NOTE — Telephone Encounter (Signed)
Patient Advocate Encounter  Prior Authorization for SKYRIZI '360MG'$  has been approved.    PA# [id number] Effective dates: 1.1.24 through 12.31.24   Received notification from [facility] that prior authorization for [facility] '360MG'$  is required.   PA submitted on 1.9.24 Key [id number] Status is pending

## 2022-07-23 NOTE — Telephone Encounter (Signed)
Has the patient been notified? He had received medication in the past with pt assistance. Not sure if he will be able to afford this.

## 2022-07-24 ENCOUNTER — Other Ambulatory Visit (HOSPITAL_COMMUNITY): Payer: Self-pay

## 2022-07-30 NOTE — Telephone Encounter (Signed)
See note below. Pts copay will be 200.00/mth.

## 2022-07-30 NOTE — Telephone Encounter (Signed)
Monchell please see note below from Dr. Hilarie Fredrickson and let us know.

## 2022-07-30 NOTE — Telephone Encounter (Signed)
Ok. Can you please help clarify we will do $200 cost to be only associated with the induction infusions or will this also continue once he begins subcutaneous therapy? If it is infusion only perhaps he could afford? Could you clarify and check with patient

## 2022-08-01 NOTE — Telephone Encounter (Signed)
I've sent a message to Maudie Mercury C to see if she can resubmit the PA with Dx code of Crohn's Disease. Additionally, it looks like when I added the patient in [employer] it's not saying that he's not eligible, but rather it's pending benefits investigation. So it's still pending with [employer].

## 2022-08-01 NOTE — Telephone Encounter (Signed)
Would you be able to submit a new PA for the infusion?

## 2022-08-02 NOTE — Telephone Encounter (Signed)
That would be Ok Edwards at the infusion center. That is who Monchell said she was sending it to.

## 2022-08-02 NOTE — Telephone Encounter (Signed)
Christopher Smith, how do we send a new PA for infusion? This can be done by whomever normally handles

## 2022-08-06 NOTE — Telephone Encounter (Signed)
Dr. Hilarie Fredrickson, Juluis Rainier note:    Auth Submission: Approved - (skyrizi IV) Payer: [employer] Medication & CPT/J Code(s) submitted: SKYRIZI '360MG'$  iv Route of submission (phone, fax, portal):  Phone [phone] Fax [phone] Auth type: Buy/Bill Units/visits requested: 3 doses Reference number: [id number] Approval from: 08/06/22  to  07/15/23.

## 2022-08-07 NOTE — Telephone Encounter (Signed)
l °

## 2022-08-08 NOTE — Telephone Encounter (Signed)
Monchell has the pt been approved for assistance for the injection?

## 2022-08-09 NOTE — Telephone Encounter (Signed)
Patient called to follow up on medication. Please advise.

## 2022-08-12 NOTE — Telephone Encounter (Signed)
Yes, the key is to know if he is approved for injection portion of the therapy Infusion previously approved The wait has been excessive and he needs medication  And help is much appreciated JMP

## 2022-08-12 NOTE — Telephone Encounter (Signed)
[name], I have faxed PAP forms to [phone] (awaiting MD signature). I have informed [employer] that this is an urgent case due to patient has been awaiting the medication to be approved for some time now. I will f/u once I have a response from [employer]  and Skyrizi PAP.

## 2022-08-12 NOTE — Telephone Encounter (Signed)
Dr. Hilarie Fredrickson see note below. The forms are in your inbox for signature if you get a chance this week.

## 2022-08-13 ENCOUNTER — Ambulatory Visit: Payer: Medicare PPO

## 2022-08-15 ENCOUNTER — Encounter: Payer: Self-pay | Admitting: Internal Medicine

## 2022-08-15 ENCOUNTER — Telehealth: Payer: Self-pay | Admitting: Pharmacy Technician

## 2022-08-15 NOTE — Telephone Encounter (Signed)
F/U:  Christopher Smith,  Wanted to f/u with the PAP forms.  I faxed the forms on 08/12/22 to fax: [phone]. I have not received signed copy yet. Did you receive the forms?  The patient has been approved for skyrizi, but will need patient assistance.

## 2022-08-15 NOTE — Telephone Encounter (Signed)
Dr. Hilarie Fredrickson, Noted: Thanks

## 2022-08-15 NOTE — Telephone Encounter (Signed)
I faxed paperwork back earlier this week to [facility].

## 2022-08-15 NOTE — Telephone Encounter (Signed)
Monchell please let us know an update on this for the pt. 2 months ago we were supposed to hear something in a few days. It has been 2 months.

## 2022-08-15 NOTE — Telephone Encounter (Signed)
I faxed them back yesterday morning to the number on the form.  [name] also has a copy.

## 2022-08-23 ENCOUNTER — Telehealth: Payer: Self-pay | Admitting: Pharmacy Technician

## 2022-08-23 NOTE — Telephone Encounter (Signed)
F/U: Skyrizi PAP  Spoke with patient in regard to PAP.  Program has made attempts to reach patient and verify income and house hold size. Patient was provided information and number to call for verification.   Awaiting response from Skyrizi PAP. Will need to f/u.

## 2022-09-02 DIAGNOSIS — I10 Essential (primary) hypertension: Secondary | ICD-10-CM | POA: Diagnosis not present

## 2022-09-02 DIAGNOSIS — R7303 Prediabetes: Secondary | ICD-10-CM | POA: Diagnosis not present

## 2022-09-02 DIAGNOSIS — M1380 Other specified arthritis, unspecified site: Secondary | ICD-10-CM | POA: Diagnosis not present

## 2022-09-02 DIAGNOSIS — E785 Hyperlipidemia, unspecified: Secondary | ICD-10-CM | POA: Diagnosis not present

## 2022-09-02 DIAGNOSIS — M109 Gout, unspecified: Secondary | ICD-10-CM | POA: Diagnosis not present

## 2022-09-02 DIAGNOSIS — K219 Gastro-esophageal reflux disease without esophagitis: Secondary | ICD-10-CM | POA: Diagnosis not present

## 2022-09-05 NOTE — Telephone Encounter (Signed)
Ok thank you 

## 2022-09-05 NOTE — Telephone Encounter (Signed)
Any update?

## 2022-09-09 DIAGNOSIS — R7989 Other specified abnormal findings of blood chemistry: Secondary | ICD-10-CM | POA: Diagnosis not present

## 2022-09-09 DIAGNOSIS — M1380 Other specified arthritis, unspecified site: Secondary | ICD-10-CM | POA: Diagnosis not present

## 2022-09-09 DIAGNOSIS — K219 Gastro-esophageal reflux disease without esophagitis: Secondary | ICD-10-CM | POA: Diagnosis not present

## 2022-09-09 DIAGNOSIS — Z125 Encounter for screening for malignant neoplasm of prostate: Secondary | ICD-10-CM | POA: Diagnosis not present

## 2022-09-09 DIAGNOSIS — K5190 Ulcerative colitis, unspecified, without complications: Secondary | ICD-10-CM | POA: Diagnosis not present

## 2022-09-09 DIAGNOSIS — I10 Essential (primary) hypertension: Secondary | ICD-10-CM | POA: Diagnosis not present

## 2022-09-09 DIAGNOSIS — E785 Hyperlipidemia, unspecified: Secondary | ICD-10-CM | POA: Diagnosis not present

## 2022-09-09 DIAGNOSIS — R7303 Prediabetes: Secondary | ICD-10-CM | POA: Diagnosis not present

## 2022-09-09 DIAGNOSIS — M109 Gout, unspecified: Secondary | ICD-10-CM | POA: Diagnosis not present

## 2022-09-18 ENCOUNTER — Telehealth: Payer: Self-pay | Admitting: Pharmacy Technician

## 2022-09-18 NOTE — Telephone Encounter (Signed)
Dr. Hilarie Fredrickson. PAP f/u:  Patient has been denied for Skyrizi PAP based on financial/insurance documentation. Per [employer] a denial letter was faxed to Dr. Hilarie Fredrickson on 09/10/22 Have you received the denial letter? I did not see any documentation that the patient has been denied, so I requested for them to refax the letter.  (Note: application process was delayed due to patient did not submit financial documents until 09/05/22)

## 2022-09-18 NOTE — Telephone Encounter (Signed)
Have not seen denial. Dr. Hilarie Fredrickson notified.

## 2022-09-19 NOTE — Telephone Encounter (Signed)
PT is calling to get an update on Skyrizi. He said that he is in need of medication and wants to know if there is another option for him. Please advise

## 2022-09-20 NOTE — Telephone Encounter (Signed)
Christopher Smith is expected to be soon approved for ulcerative colitis and I bet we can get it covered once this is officially approved by the FDA He has already been on multiple other medications and for that reason I would recommend we continue the [facility] for now Please schedule him for routine follow-up with me if this is not already in place

## 2022-09-20 NOTE — Telephone Encounter (Signed)
Pt calling and states he has been denied for the skyrizi. He is wanting to know if there is something else he could try.

## 2022-09-23 NOTE — Telephone Encounter (Signed)
Left message on machine to call back  

## 2022-09-24 NOTE — Progress Notes (Unsigned)
Office Visit Note  Patient: Christopher Smith             Date of Birth: Jul 15, 1951           MRN: [id number]             PCP: Janie Morning, DO Referring: Janie Morning, DO Visit Date: 10/08/2022 Occupation: @GUAROCC @  Subjective:  Right SI joint pain   History of Present Illness: Christopher Smith is a 72 y.o. male with history of chronic inflammatory arthritis and ulcerative pancolitis.  Patient discontinued oral methotrexate in August 2023 prior to undergoing a tooth instruction.  He never restarted on methotrexate due to initially not noticing any exacerbation of symptoms.  He has since had increased pain and stiffness involving multiple joints including the shoulders, both hands, his lower back, both feet.  His pain has been most severe in the right SI joint.  According to the patient his gastroenterologist tried to switch him to [facility] for management of ulcerative colitis and inflammatory arthritis but his insurance has declined it.  He will be resuming Entyvio infusions tomorrow.  He would like to discuss treatment options for better management have his inflammatory arthritis since he has to remain on Entyvio for now.  He has not been taking any OTC products for pain relief.    Activities of Daily Living:  Patient reports morning stiffness for 15 minutes.   Patient Reports nocturnal pain.  Difficulty dressing/grooming: Reports Difficulty climbing stairs: Denies Difficulty getting out of chair: Reports Difficulty using hands for taps, buttons, cutlery, and/or writing: Reports  Review of Systems  Constitutional:  Positive for fatigue.  HENT:  Negative for mouth sores and mouth dryness.   Eyes:  Negative for dryness.  Respiratory:  Negative for shortness of breath.   Cardiovascular:  Negative for chest pain and palpitations.  Gastrointestinal:  Positive for diarrhea. Negative for blood in stool and constipation.  Endocrine: Negative for increased urination.  Genitourinary:   Negative for involuntary urination.  Musculoskeletal:  Positive for joint pain, gait problem, joint pain, joint swelling, myalgias, morning stiffness and myalgias. Negative for muscle weakness and muscle tenderness.  Skin:  Positive for hair loss. Negative for color change, rash and sensitivity to sunlight.  Allergic/Immunologic: Negative for susceptible to infections.  Neurological:  Positive for dizziness. Negative for headaches.  Hematological:  Negative for swollen glands.  Psychiatric/Behavioral:  Positive for depressed mood and sleep disturbance. The patient is not nervous/anxious.     PMFS History:  Patient Active Problem List   Diagnosis Date Noted   Open fracture of one or more phalanges of hand 11/21/2017   Chronic inflammatory arthritis 08/05/2016   High risk medication use 08/05/2016   Contracture of elbow, right 08/05/2016   History of total knee replacement, bilateral 08/05/2016   Idiopathic chronic gout 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 tophus 08/05/2016   History of bilateral rotator cuff tear repair 08/05/2016   Gastroesophageal reflux  08/05/2016   Latent tuberculosis by blood test 08/05/2016   Ulcerative colitis 09/03/2011   Glucose intolerance (impaired glucose tolerance) 09/03/2011   Severe diarrhea 09/03/2011   URI (upper respiratory infection) 07/02/2011   Bile salt-induced diarrhea 07/02/2011   S/P cholecystectomy 07/02/2011   Ulcerative colitis ([place]) 04/03/2011   Benign neoplasm of colon 04/03/2011   Positive TB test 03/22/2011   Insomnia due to medical condition 03/22/2011   UC (ulcerative colitis) ([place]) 03/01/2011   Leg cramps 03/01/2011   Ulcerative colitis with rectal bleeding ([place]) 01/24/2011   Nonspecific  abnormal finding in stool contents 01/24/2011   Heme positive stool 01/11/2011   Diarrhea 01/11/2011   Arthritis associated with inflammatory bowel disease 01/11/2011   Intentional weight loss 01/11/2011    Past Medical History:  Diagnosis Date   Allergy     Anal fissure    Arthritis    RA   Blood transfusion without reported diagnosis    COVID-19 06/2020   Diabetes mellitus    no meds taken 05-01-16- elevated glucose was from [facility] for UC   Diarrhea    Fatty liver    GERD (gastroesophageal reflux disease)    Gout    Hiatal hernia    Hypertension    Internal hemorrhoids    Polyarthritis    PONV (postoperative nausea and vomiting)    Sessile rectal polyp 03/27/2011   Sinusitis    Tuberculosis    positive PPD- had an allergic reaction to the PPD, gets yearly chest xray   Tubular adenoma of colon    Ulcerative colitis 01/24/2011    Family History  Problem Relation Age of Onset   Alzheimer's disease Father    Stroke Mother    Cancer Mother        liver & breast    Breast cancer Mother    Liver cancer Mother    Rheum arthritis Brother    Rheum arthritis Brother    Rheum arthritis Sister    Rheum arthritis Sister    Breast cancer Sister    Rheum arthritis Sister    Breast cancer Sister    Colon cancer Neg Hx    Esophageal cancer Neg Hx    Stomach cancer Neg Hx    Rectal cancer Neg Hx    Colon polyps Neg Hx    Past Surgical History:  Procedure Laterality Date   CHOLECYSTECTOMY     COLONOSCOPY     KNEE SURGERY     Bilateral    POLYPECTOMY     SHOULDER SURGERY Bilateral    Bilateral - rotator cuff repair    TOTAL KNEE ARTHROPLASTY Bilateral    Social History   Social History Narrative   1 caffeine drink daily       2 sons and a step daughter   Immunization History  Administered Date(s) Administered   Influenza, High Dose Seasonal PF 04/28/2019   Moderna Sars-Covid-2 Vaccination 09/08/2019, 10/06/2019   Tdap 11/19/2017     Objective: Vital Signs: BP 133/74 (BP Location: Left Arm, Patient Position: Sitting, Cuff Size: Normal)   Pulse (!) 59   Resp 16   Ht 5\' 11"  (1.803 m)   Wt 219 lb (99.3 kg)   BMI 30.54 kg/m    Physical Exam Vitals and nursing note reviewed.  Constitutional:      Appearance: He is  well-developed.  HENT:     Head: Normocephalic and atraumatic.  Eyes:     Conjunctiva/sclera: Conjunctivae normal.     Pupils: Pupils are equal, round, and reactive to light.  Cardiovascular:     Rate and Rhythm: Normal rate and regular rhythm.     Heart sounds: Normal heart sounds.  Pulmonary:     Effort: Pulmonary effort is normal.     Breath sounds: Normal breath sounds.  Abdominal:     General: Bowel sounds are normal.     Palpations: Abdomen is soft.  Musculoskeletal:     Cervical back: Normal range of motion and neck supple.  Skin:    General: Skin is warm and dry.  Capillary Refill: Capillary refill takes less than 2 seconds.  Neurological:     Mental Status: He is alert and oriented to person, place, and time.  Psychiatric:        Behavior: Behavior normal.      Musculoskeletal Exam: C-spine has limited range of motion with lateral rotation.  Limited range of motion of the lumbar spine.  Tenderness over the right SI joint.  Shoulder joints have discomfort and stiffness with range of motion bilaterally.  Right elbow joint flexion contracture noted.  Left elbow has good range of motion with no discomfort.  Wrist joints have good range of motion with no tenderness or synovitis.  PIP and DIP thickening.  Discomfort with full fist formation bilaterally.  Hip joints have good range of motion with no groin pain.  Both knee replacements have good range of motion.  Effusion noted in the left knee replacement.  Ankle joints have good range of motion with no joint tenderness or synovitis.  No evidence of Achilles tendinitis or plantar fasciitis.  CDAI Exam: CDAI Score: 1  Patient Global: 5 mm; Provider Global: 5 mm Swollen: 0 ; Tender: 1  Joint Exam 10/08/2022      Right  Left  Sacroiliac   Tender        Investigation: No additional findings.  Imaging: No results found.  Recent Labs: Lab Results  Component Value Date   WBC 6.7 05/09/2022   HGB 14.7 05/09/2022   PLT  267 05/09/2022   NA 137 05/09/2022   K 4.6 05/09/2022   CL 102 05/09/2022   CO2 29 05/09/2022   GLUCOSE 105 (H) 05/09/2022   BUN 11 05/09/2022   CREATININE 0.98 05/09/2022   BILITOT 1.0 05/09/2022   ALKPHOS 65 11/19/2016   AST 26 05/09/2022   ALT 25 05/09/2022   PROT 7.6 05/09/2022   ALBUMIN 4.4 11/19/2016   CALCIUM 9.3 05/09/2022   GFRAA 103 01/16/2021   QFTBGOLDPLUS CANCELED 10/10/2021    Speciality Comments: Last Dexa Scan 03/21/2020  Prior therapy: Humira, Remicaide, Cimzia, Stelara (inadequate response)  Procedures:  No procedures performed Allergies: Patient has no known allergies.     Assessment / Plan:     Visit Diagnoses: Chronic inflammatory arthritis - Inflammatory, erosive: Patient presents today with an increased pain and stiffness involving multiple joints.  His pain has been most severe in the right SI joint but he continues to have discomfort and stiffness in both hands and both feet.  He has had some increase stiffness in both shoulder joints as well.  He discontinued methotrexate in August 2023 prior to undergoing a tooth instruction.  At his last office visit on 05/09/2022 he had not noticed any new or worsening symptoms while off of methotrexate so we decided to continue to hold methotrexate.  He has noticed a recurrence of his symptoms over the past few months.  He would like to discuss other medication options.  His insurance declined the use of [employer] so he will be resuming [employer] as prescribed by Dr. Hilarie Fredrickson. For management of inflammatory arthritis the plan is to resume methotrexate as previously prescribed.  CBC and CMP will be updated today and a prescription for methotrexate and folic acid will be sent to the pharmacy pending lab results tomorrow.  He will resume methotrexate 0.6 mL sq injections once weekly and folic acid 1 mg daily.  Plan to also schedule a right SI joint cortisone injection to alleviate his current discomfort.  He will notify us  if he continues to have recurrent flares.  He will follow-up in the office in 3 months or sooner if needed.  Patient was counseled on the purpose, proper use, and adverse effects of methotrexate including nausea, infection, and signs and symptoms of pneumonitis.  Reviewed instructions with patient to take methotrexate weekly along with folic acid daily.  Discussed the importance of frequent monitoring of kidney and liver function and blood counts, and provided patient with standing lab instructions.  Counseled patient to avoid NSAIDs and alcohol while on methotrexate.  Provided patient with educational materials on methotrexate and answered all questions.  Advised patient to get annual influenza vaccine and to get a pneumococcal vaccine if patient has not already had one.  Patient voiced understanding.  Patient consented to methotrexate use.  Will upload into chart.    High risk medication use - Methotrexate 0.6 ml subcutaneous injections once weekly, folic acid 1 mg daily.  Entyvio IV infusions prescribed by GI-resuming tomorrow on 10/09/22.  Previously held methotrexate from August 2023 until March 2024 CBC and CMP updated on 05/09/22.  Orders for CBC and CMP were released today.  He will require lab work every 3 months to monitor for drug toxicity.  CXR no active cardiopulmonary disease on 06/03/22.   Discussed the importance of holding methotrexate if he develops signs or symptoms of an infection and to resume once the infection has completely cleared. - Plan: COMPLETE METABOLIC PANEL WITH GFR, CBC with Differential/Platelet  Positive TB test: Chest x-ray did not reveal any active cardiopulmonary disease on 06/03/22.   Other ulcerative colitis with other complication ([place]) - Diagnosed in 2011.  Followed closely by Dr. Hilarie Fredrickson.  He has failed Humira, Remicade, Cimzia, Stelara. Insurance declined the use of [employer].   He has been having 4-5 bowel movements per day.  No recent blood in his stool.  He  has intermittent abdominal pain.  He will be restarting Entyvio infusions tomorrow.  Idiopathic chronic gout of multiple sites without tophus - He has not had any signs or symptoms of a gout flare.  He is clinically doing well taking allopurinol 300 mg 1 tablet by mouth daily for management of gout.  History of bilateral rotator cuff tear repair: He has some discomfort and stiffness in both shoulder joints on examination today.  Contracture of right elbow: Unchanged.  No active inflammation noted.  Primary osteoarthritis of both hands: PIP and DIP thickening consistent with osteoarthritis of both hands.  He has been experiencing some increased pain and stiffness in both hands.  History of total bilateral knee replacement - Dr. Ronnie Derby.  Good range of motion at both knee replacements.  Effusion noted in the left knee.  Chronic right-sided low back pain without sciatica - He has limited range of motion at the lumbar spine on examination today.  No midline spinal tenderness was noted.  X-rays of the lumbar spine and pelvis were obtained today for further evaluation.  Plan: XR Lumbar Spine 2-3 Views, XR Pelvis 1-2 Views  Chronic right SI joint pain - He has been experiencing significant discomfort in his lower back especially in the right SI joint.  He has not been taking any over-the-counter products for pain relief.  X-rays of the lumbar spine and pelvis were obtained today for further evaluation.  He may benefit from scheduling a right SI joint cortisone injection.  Plan to resume methotrexate as prescribed.  Discussed that methotrexate is not always overly helpful at relieving axial skeleton pain.   He  would like to try a right SI joint cortisone injection. Plan: XR Pelvis 1-2 Views  History of gastroesophageal reflux (GERD)   Orders: Orders Placed This Encounter  Procedures   XR Lumbar Spine 2-3 Views   XR Pelvis 1-2 Views   COMPLETE METABOLIC PANEL WITH GFR   CBC with Differential/Platelet    No orders of the defined types were placed in this encounter.    Follow-Up Instructions: Return in about 3 months (around 01/08/2023).   Ofilia Neas, PA-C  Note - This record has been created using Dragon software.  Chart creation errors have been sought, but may not always  have been located. Such creation errors do not reflect on  the standard of medical care.

## 2022-09-26 NOTE — Telephone Encounter (Signed)
The pt has an appt on 6/19 at 9:50 am to see Dr Hilarie Fredrickson. The pt aware that Dr Vena Rua nurse will contact him regarding Entyvio.

## 2022-10-01 ENCOUNTER — Other Ambulatory Visit: Payer: Self-pay | Admitting: Internal Medicine

## 2022-10-01 DIAGNOSIS — K5100 Ulcerative (chronic) pancolitis without complications: Secondary | ICD-10-CM

## 2022-10-01 NOTE — Telephone Encounter (Signed)
I reentered infusion orders for vedolizumab He can resume 300 mg every 8 weeks without reinduction

## 2022-10-01 NOTE — Telephone Encounter (Signed)
See note below. Dr. Hilarie Fredrickson wants pt to continue Entyvio. Was he getting this at the infusion center previously?

## 2022-10-01 NOTE — Progress Notes (Signed)
Resume maintenance, does not need induction 300 mg every 8 weeks

## 2022-10-01 NOTE — Telephone Encounter (Signed)
Dr. Hilarie Fredrickson please see note below from [facility]. The orders need to be entered for entyvio.

## 2022-10-03 ENCOUNTER — Other Ambulatory Visit: Payer: Self-pay | Admitting: Pharmacy Technician

## 2022-10-07 ENCOUNTER — Telehealth: Payer: Self-pay | Admitting: Pharmacy Technician

## 2022-10-07 NOTE — Telephone Encounter (Signed)
Dr. Hilarie Fredrickson,  Auth Submission: APPROVED Site of care: Site of care: [facility] Payer: [employer] MEDICARE Medication & CPT/J Code(s) submitted: Entyvio (Vedolizumab) J3380 Route of submission (phone, fax, portal):  Phone # Fax # Auth type: Buy/Bill Units/visits requested:  Reference number: [id number] Approval from: 10/10/21 to 07/15/23   Patient will be scheduled as soon as possible

## 2022-10-08 ENCOUNTER — Ambulatory Visit: Payer: Medicare PPO | Attending: Physician Assistant | Admitting: Physician Assistant

## 2022-10-08 ENCOUNTER — Ambulatory Visit (INDEPENDENT_AMBULATORY_CARE_PROVIDER_SITE_OTHER): Payer: Medicare PPO

## 2022-10-08 ENCOUNTER — Encounter: Payer: Self-pay | Admitting: Physician Assistant

## 2022-10-08 ENCOUNTER — Other Ambulatory Visit: Payer: Self-pay | Admitting: *Deleted

## 2022-10-08 VITALS — BP 133/74 | HR 59 | Resp 16 | Ht 71.0 in | Wt 219.0 lb

## 2022-10-08 DIAGNOSIS — K51818 Other ulcerative colitis with other complication: Secondary | ICD-10-CM | POA: Diagnosis not present

## 2022-10-08 DIAGNOSIS — Z79899 Other long term (current) drug therapy: Secondary | ICD-10-CM | POA: Diagnosis not present

## 2022-10-08 DIAGNOSIS — M1A09X0 Idiopathic chronic gout, multiple sites, without tophus (tophi): Secondary | ICD-10-CM

## 2022-10-08 DIAGNOSIS — Z8719 Personal history of other diseases of the digestive system: Secondary | ICD-10-CM | POA: Diagnosis not present

## 2022-10-08 DIAGNOSIS — M19041 Primary osteoarthritis, right hand: Secondary | ICD-10-CM | POA: Diagnosis not present

## 2022-10-08 DIAGNOSIS — M1990 Unspecified osteoarthritis, unspecified site: Secondary | ICD-10-CM | POA: Diagnosis not present

## 2022-10-08 DIAGNOSIS — Z96653 Presence of artificial knee joint, bilateral: Secondary | ICD-10-CM

## 2022-10-08 DIAGNOSIS — R7611 Nonspecific reaction to tuberculin skin test without active tuberculosis: Secondary | ICD-10-CM | POA: Diagnosis not present

## 2022-10-08 DIAGNOSIS — M545 Low back pain, unspecified: Secondary | ICD-10-CM

## 2022-10-08 DIAGNOSIS — G8929 Other chronic pain: Secondary | ICD-10-CM | POA: Diagnosis not present

## 2022-10-08 DIAGNOSIS — M19042 Primary osteoarthritis, left hand: Secondary | ICD-10-CM

## 2022-10-08 DIAGNOSIS — M12819 Other specific arthropathies, not elsewhere classified, unspecified shoulder: Secondary | ICD-10-CM

## 2022-10-08 DIAGNOSIS — M533 Sacrococcygeal disorders, not elsewhere classified: Secondary | ICD-10-CM

## 2022-10-08 DIAGNOSIS — M24521 Contracture, right elbow: Secondary | ICD-10-CM

## 2022-10-08 NOTE — Telephone Encounter (Signed)
Per Hazel Sams, PA-C, refill MTX and Folic Acid pending labs.

## 2022-10-08 NOTE — Progress Notes (Signed)
CBC WNL

## 2022-10-09 ENCOUNTER — Ambulatory Visit (INDEPENDENT_AMBULATORY_CARE_PROVIDER_SITE_OTHER): Payer: Medicare PPO

## 2022-10-09 VITALS — BP 129/72 | HR 62 | Temp 97.7°F | Resp 16 | Ht 71.0 in | Wt 222.8 lb

## 2022-10-09 DIAGNOSIS — K5100 Ulcerative (chronic) pancolitis without complications: Secondary | ICD-10-CM

## 2022-10-09 LAB — COMPLETE METABOLIC PANEL WITH GFR
AG Ratio: 1.4 (calc) (ref 1.0–2.5)
ALT: 20 U/L (ref 9–46)
AST: 18 U/L (ref 10–35)
Albumin: 4.5 g/dL (ref 3.6–5.1)
Alkaline phosphatase (APISO): 60 U/L (ref 35–144)
BUN: 16 mg/dL (ref 7–25)
CO2: 29 mmol/L (ref 20–32)
Calcium: 9.5 mg/dL (ref 8.6–10.3)
Chloride: 102 mmol/L (ref 98–110)
Creat: 0.78 mg/dL (ref 0.70–1.28)
Globulin: 3.2 g/dL (calc) (ref 1.9–3.7)
Glucose, Bld: 109 mg/dL — ABNORMAL HIGH (ref 65–99)
Potassium: 4.6 mmol/L (ref 3.5–5.3)
Sodium: 138 mmol/L (ref 135–146)
Total Bilirubin: 0.7 mg/dL (ref 0.2–1.2)
Total Protein: 7.7 g/dL (ref 6.1–8.1)
eGFR: 95 mL/min/{1.73_m2} (ref >=60)

## 2022-10-09 LAB — CBC WITH DIFFERENTIAL/PLATELET
Absolute Monocytes: 687 cells/uL (ref 200–950)
Basophils Absolute: 40 cells/uL (ref 0–200)
Basophils Relative: 0.5 %
Eosinophils Absolute: 150 cells/uL (ref 15–500)
Eosinophils Relative: 1.9 %
HCT: 44.1 % (ref 38.5–50.0)
Hemoglobin: 14.9 g/dL (ref 13.2–17.1)
Lymphs Abs: 2228 cells/uL (ref 850–3900)
MCH: 30.7 pg (ref 27.0–33.0)
MCHC: 33.8 g/dL (ref 32.0–36.0)
MCV: 90.7 fL (ref 80.0–100.0)
MPV: 11.2 fL (ref 7.5–12.5)
Monocytes Relative: 8.7 %
Neutro Abs: 4795 cells/uL (ref 1500–7800)
Neutrophils Relative %: 60.7 %
Platelets: 273 10*3/uL (ref 140–400)
RBC: 4.86 10*6/uL (ref 4.20–5.80)
RDW: 14.2 % (ref 11.0–15.0)
Total Lymphocyte: 28.2 %
WBC: 7.9 10*3/uL (ref 3.8–10.8)

## 2022-10-09 MED ORDER — METHOTREXATE SODIUM CHEMO INJECTION 50 MG/2ML: 15.0000 mg | INTRAMUSCULAR | 0 refills | Status: AC

## 2022-10-09 MED ORDER — FOLIC ACID 1 MG PO TABS: 1.0000 mg | ORAL_TABLET | Freq: Every day | ORAL | 0 refills | Status: DC

## 2022-10-09 MED ORDER — "BD TB SYRINGE 27G X 1/2"" 1 ML MISC": 12.0000 | 3 refills | Status: DC

## 2022-10-09 MED ORDER — VEDOLIZUMAB 300 MG IV SOLR
300.0000 mg | Freq: Once | INTRAVENOUS | Status: AC
  Administered 2022-10-09: 300 mg via INTRAVENOUS
  Filled 2022-10-09: qty 5

## 2022-10-09 NOTE — Progress Notes (Signed)
Glucose is 109. Rest of CMP WNL

## 2022-10-09 NOTE — Addendum Note (Signed)
Addended by: Carole Binning on: 10/09/2022 08:21 AM   Modules accepted: Orders

## 2022-10-09 NOTE — Telephone Encounter (Signed)
CBC WNL. Glucose is 109. Rest of CMP WNL   Per office note 10/08/2022: Methotrexate 0.6 ml subcutaneous injections once weekly, folic acid 1 mg daily.

## 2022-10-09 NOTE — Progress Notes (Signed)
Diagnosis: Ulcerative Colitis  Provider:  Marshell Garfinkel MD  Procedure: Infusion  IV Type: Peripheral, IV Location: L wrist  Entyvio (Vedolizumab), Dose: 300 mg  Infusion Start Time: [id number]  Infusion Stop Time: [id number]  Post Infusion IV Care: Patient declined observation and Peripheral IV Discontinued  Discharge: Condition: Stable, Destination: Home . AVS Declined  Performed by:  Binnie Kand, RN

## 2022-10-10 NOTE — Progress Notes (Signed)
X-rays of the lumbar spine were consistent with degenerative disc disease and facet joint arthropathy.  X-rays of the pelvis were unremarkable.  Please notify the patient.  He will be returning on 10/30/2022 for a right SI joint cortisone injection.

## 2022-10-30 ENCOUNTER — Ambulatory Visit: Payer: Medicare PPO | Attending: Rheumatology | Admitting: Rheumatology

## 2022-10-30 VITALS — BP 136/77 | HR 58

## 2022-10-30 DIAGNOSIS — M533 Sacrococcygeal disorders, not elsewhere classified: Secondary | ICD-10-CM | POA: Diagnosis not present

## 2022-10-30 DIAGNOSIS — M1990 Unspecified osteoarthritis, unspecified site: Secondary | ICD-10-CM

## 2022-10-30 DIAGNOSIS — G8929 Other chronic pain: Secondary | ICD-10-CM

## 2022-10-30 MED ORDER — TRIAMCINOLONE ACETONIDE 40 MG/ML IJ SUSP
40.0000 mg | INTRAMUSCULAR | Status: AC | PRN
  Administered 2022-10-30: 40 mg via INTRA_ARTICULAR

## 2022-10-30 MED ORDER — LIDOCAINE HCL 1 % IJ SOLN
1.5000 mL | INTRAMUSCULAR | Status: AC | PRN
  Administered 2022-10-30: 1.5 mL

## 2022-10-30 NOTE — Progress Notes (Signed)
   Procedure Note  Patient: Christopher Smith             Date of Birth: 11/17/1950           MRN: [id number]             Visit Date: 10/30/2022  Procedures: Visit Diagnoses:  1. Chronic right SI joint pain   2. Chronic inflammatory arthritis   Patient continues to experience the right SI joint..  Per patient's request right SI joint was injected with lidocaine and Kenalog as described below.  Patient tolerated the procedure well.  Postprocedure instructions were given.  Sacroiliac Joint Inj on 10/30/2022 11:35 AM Indications: pain Details: 27 G 1.5 in needle, posterior approach Medications: 1.5 mL lidocaine 1 %; 40 mg triamcinolone acetonide 40 MG/ML Aspirate: 0 mL Outcome: tolerated well, no immediate complications Procedure, treatment alternatives, risks and benefits explained, specific risks discussed. Consent was given by the patient. Immediately prior to procedure a time out was called to verify the correct patient, procedure, equipment, support staff and site/side marked as required. Patient was prepped and draped in the usual sterile fashion.     Pollyann Savoy, MD

## 2022-11-07 ENCOUNTER — Other Ambulatory Visit: Payer: Self-pay | Admitting: Rheumatology

## 2022-11-07 MED ORDER — METHOCARBAMOL 500 MG PO TABS: 500.0000 mg | ORAL_TABLET | Freq: Three times a day (TID) | ORAL | 0 refills | Status: DC

## 2022-11-07 NOTE — Telephone Encounter (Signed)
Patient left a voicemail stating "the shots Dr. Corliss Skains gave me in the back last week didn't do any good at all and we need to do something else."  Patient requested a return call.

## 2022-11-07 NOTE — Telephone Encounter (Signed)
I returned patient's call.  He stated that he is having muscle spasms in the back.  He stated when he bends over and gets up then he experiences sudden sharp pain.  The pain resolves when he stands up.   He denies any radiculopathy.  I offered muscle relaxer and discussed the side effects of muscle relaxers.  He was advised not to drive while he is taking muscle relaxers.  He voiced understanding.  Please call in a prescription for methocarbamol 500 mg 1 tablet p.o. 3 times daily as needed total 30 tablets.

## 2022-11-20 ENCOUNTER — Encounter: Payer: Self-pay | Admitting: Internal Medicine

## 2022-12-02 ENCOUNTER — Other Ambulatory Visit: Payer: Self-pay | Admitting: Rheumatology

## 2022-12-04 ENCOUNTER — Ambulatory Visit (INDEPENDENT_AMBULATORY_CARE_PROVIDER_SITE_OTHER): Payer: Medicare PPO

## 2022-12-04 VITALS — BP 147/87 | HR 49 | Temp 97.6°F | Resp 16 | Ht 71.0 in | Wt 218.0 lb

## 2022-12-04 DIAGNOSIS — K5100 Ulcerative (chronic) pancolitis without complications: Secondary | ICD-10-CM | POA: Diagnosis not present

## 2022-12-04 MED ORDER — VEDOLIZUMAB 300 MG IV SOLR
300.0000 mg | Freq: Once | INTRAVENOUS | Status: AC
  Administered 2022-12-04: 300 mg via INTRAVENOUS
  Filled 2022-12-04: qty 5

## 2022-12-04 NOTE — Progress Notes (Signed)
Diagnosis: Crohn's Disease  Provider:  Chilton Greathouse MD  Procedure: IV Infusion  IV Type: Peripheral, IV Location: L Antecubital  Entyvio (Vedolizumab), Dose: 300 mg  Infusion Start Time: 0911  Infusion Stop Time: 0945  Post Infusion IV Care: Observation period completed  Discharge: Condition: Good, Destination: Home . AVS Declined  Performed by:  Loney Hering, LPN

## 2022-12-11 ENCOUNTER — Other Ambulatory Visit: Payer: Self-pay | Admitting: Physician Assistant

## 2022-12-11 NOTE — Telephone Encounter (Signed)
Last Fill: 05/20/2022  Labs: 10/08/2022 CBC WNL. Glucose is 109. Rest of CMP WNL   Next Visit: 01/08/2023  Last Visit: 10/08/2022  DX: Idiopathic chronic gout of multiple sites without tophus   Current Dose per office note on 10/08/2022: allopurinol 300 mg 1 tablet by mouth daily for management of gout.   Okay to refill Allopurinol?

## 2022-12-16 ENCOUNTER — Other Ambulatory Visit: Payer: Self-pay | Admitting: Internal Medicine

## 2022-12-16 DIAGNOSIS — K219 Gastro-esophageal reflux disease without esophagitis: Secondary | ICD-10-CM

## 2022-12-25 NOTE — Progress Notes (Unsigned)
Office Visit Note  Patient: Christopher Smith             Date of Birth: 1950-11-27           MRN: [id number]             PCP: Irena Reichmann, DO Referring: Irena Reichmann, DO Visit Date: 01/08/2023 Occupation: @GUAROCC @  Subjective:  Intermittent lower back pain   History of Present Illness: Christopher Smith is a 72 y.o. male with history of chronic inflammatory arthritis and ulcerative colitis.  Patient remains on Methotrexate 0.6 ml weekly, folic acid 1 mg daily. He was initiated on Entyvio IV infusions by GI and remains on infusions every 8 weeks.  He has not missed any doses recently. He has not had any recent or recurrent infections. He denies any recent flares.  He is not experiencing any joint swelling. He denies any achilles tendonitis or plantar fasciitis.  He has occasional discomfort in his lower back which seems to be muscular.  His lower back pain is typically exacerbated by certain positions or activities.  His lower back pain has been responsive to robaxin in the past. He denies any recent UC flares.  He is not experiencing any blood in his stool and has had less bowel movements per day.  He denies any signs or symptoms of a gout flare.      Activities of Daily Living:  Patient reports morning stiffness for a few minutes.   Patient Denies nocturnal pain.  Difficulty dressing/grooming: Denies Difficulty climbing stairs: Denies Difficulty getting out of chair: Reports Difficulty using hands for taps, buttons, cutlery, and/or writing: Reports  Review of Systems  Constitutional:  Negative for fatigue.  HENT:  Negative for mouth sores and mouth dryness.   Eyes:  Negative for dryness.  Respiratory:  Negative for shortness of breath.   Cardiovascular:  Negative for chest pain and palpitations.  Gastrointestinal:  Positive for diarrhea. Negative for blood in stool and constipation.  Endocrine: Negative for increased urination.  Genitourinary:  Negative for involuntary  urination.  Musculoskeletal:  Positive for joint pain, joint pain and morning stiffness. Negative for gait problem, joint swelling, myalgias, muscle weakness, muscle tenderness and myalgias.  Skin:  Negative for color change, rash, hair loss and sensitivity to sunlight.  Allergic/Immunologic: Negative for susceptible to infections.  Neurological:  Negative for dizziness and headaches.  Hematological:  Negative for swollen glands.  Psychiatric/Behavioral:  Negative for depressed mood and sleep disturbance. The patient is not nervous/anxious.     PMFS History:  Patient Active Problem List   Diagnosis Date Noted   Open fracture of one or more phalanges of hand 11/21/2017   Chronic inflammatory arthritis 08/05/2016   High risk medication use 08/05/2016   Contracture of elbow, right 08/05/2016   History of total knee replacement, bilateral 08/05/2016   Idiopathic chronic gout 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 tophus 08/05/2016   History of bilateral rotator cuff tear repair 08/05/2016   Gastroesophageal reflux  08/05/2016   Latent tuberculosis by blood test 08/05/2016   Ulcerative colitis 09/03/2011   Glucose intolerance (impaired glucose tolerance) 09/03/2011   Severe diarrhea 09/03/2011   URI (upper respiratory infection) 07/02/2011   Bile salt-induced diarrhea 07/02/2011   S/P cholecystectomy 07/02/2011   Ulcerative colitis (HCC) 04/03/2011   Benign neoplasm of colon 04/03/2011   Positive TB test 03/22/2011   Insomnia due to medical condition 03/22/2011   UC (ulcerative colitis) (HCC) 03/01/2011   Leg cramps 03/01/2011   Ulcerative colitis  with rectal bleeding (HCC) 01/24/2011   Nonspecific abnormal finding in stool contents 01/24/2011   Heme positive stool 01/11/2011   Diarrhea 01/11/2011   Arthritis associated with inflammatory bowel disease 01/11/2011   Intentional weight loss 01/11/2011    Past Medical History:  Diagnosis Date   Allergy    Anal fissure    Arthritis    RA   Blood  transfusion without reported diagnosis    COVID-19 06/2020   Diabetes mellitus    no meds taken 05-01-16- elevated glucose was from [facility] for UC   Diarrhea    Fatty liver    GERD (gastroesophageal reflux disease)    Gout    Hiatal hernia    Hypertension    Internal hemorrhoids    Polyarthritis    PONV (postoperative nausea and vomiting)    Sessile rectal polyp 03/27/2011   Sinusitis    Tuberculosis    positive PPD- had an allergic reaction to the PPD, gets yearly chest xray   Tubular adenoma of colon    Ulcerative colitis 01/24/2011    Family History  Problem Relation Age of Onset   Alzheimer's disease Father    Stroke Mother    Cancer Mother        liver & breast    Breast cancer Mother    Liver cancer Mother    Rheum arthritis Brother    Rheum arthritis Brother    Rheum arthritis Sister    Rheum arthritis Sister    Breast cancer Sister    Rheum arthritis Sister    Breast cancer Sister    Colon cancer Neg Hx    Esophageal cancer Neg Hx    Stomach cancer Neg Hx    Rectal cancer Neg Hx    Colon polyps Neg Hx    Past Surgical History:  Procedure Laterality Date   CHOLECYSTECTOMY     COLONOSCOPY     KNEE SURGERY     Bilateral    POLYPECTOMY     SHOULDER SURGERY Bilateral    Bilateral - rotator cuff repair    TOTAL KNEE ARTHROPLASTY Bilateral    Social History   Social History Narrative   1 caffeine drink daily       2 sons and a step daughter   Immunization History  Administered Date(s) Administered   Influenza, High Dose Seasonal PF 04/28/2019   Moderna Sars-Covid-2 Vaccination 09/08/2019, 10/06/2019   Tdap 11/19/2017     Objective: Vital Signs: BP 128/72 (BP Location: Left Arm, Patient Position: Sitting, Cuff Size: Large)   Pulse (!) 50   Resp 17   Ht 5\' 11"  (1.803 m)   Wt 217 lb 3.2 oz (98.5 kg)   BMI 30.29 kg/m    Physical Exam Vitals and nursing note reviewed.  Constitutional:      Appearance: He is well-developed.  HENT:     Head:  Normocephalic and atraumatic.  Eyes:     Conjunctiva/sclera: Conjunctivae normal.     Pupils: Pupils are equal, round, and reactive to light.  Cardiovascular:     Rate and Rhythm: Normal rate and regular rhythm.     Heart sounds: Normal heart sounds.  Pulmonary:     Effort: Pulmonary effort is normal.     Breath sounds: Normal breath sounds.  Abdominal:     General: Bowel sounds are normal.     Palpations: Abdomen is soft.  Musculoskeletal:     Cervical back: Normal range of motion and neck supple.  Skin:  General: Skin is warm and dry.     Capillary Refill: Capillary refill takes less than 2 seconds.  Neurological:     Mental Status: He is alert and oriented to person, place, and time.  Psychiatric:        Behavior: Behavior normal.      Musculoskeletal Exam: C-spine has slightly limited range of motion without rotation.  No midline spinal tenderness.  No SI joint tenderness.  Shoulder joints have good range of motion with some crepitus bilaterally.  Right elbow joint flexion contracture noted.  CMC, PIP, DIP thickening consistent with osteoarthritis of both hands.  Thickening of the MCP joints but no active synovitis noted.  Hip joints have good range of motion with no groin pain.  Bilateral knee replacements have good range of motion with some warmth in the left knee.  Ankle joints have good range of motion with no tenderness or joint swelling.  No evidence of Achilles tendinitis or plantar fasciitis.  CDAI Exam: CDAI Score: -- Patient Global: --; Provider Global: -- Swollen: --; Tender: -- Joint Exam 01/08/2023   No joint exam has been documented for this visit   There is currently no information documented on the homunculus. Go to the Rheumatology activity and complete the homunculus joint exam.  Investigation: No additional findings.  Imaging: No results found.  Recent Labs: Lab Results  Component Value Date   WBC 7.9 10/08/2022   HGB 14.9 10/08/2022   PLT 273  10/08/2022   NA 138 10/08/2022   K 4.6 10/08/2022   CL 102 10/08/2022   CO2 29 10/08/2022   GLUCOSE 109 (H) 10/08/2022   BUN 16 10/08/2022   CREATININE 0.78 10/08/2022   BILITOT 0.7 10/08/2022   ALKPHOS 65 11/19/2016   AST 18 10/08/2022   ALT 20 10/08/2022   PROT 7.7 10/08/2022   ALBUMIN 4.4 11/19/2016   CALCIUM 9.5 10/08/2022   GFRAA 103 01/16/2021   QFTBGOLDPLUS CANCELED 10/10/2021    Speciality Comments: Last Dexa Scan 03/21/2020  Prior therapy: Humira, Remicaide, Cimzia, Stelara (inadequate response)  Procedures:  No procedures performed Allergies: Patient has no known allergies.   Assessment / Plan:     Visit Diagnoses: Chronic inflammatory arthritis - Inflammatory, erosive: He has no synovitis or dactylitis on examination today.  No evidence of Achilles tendinitis or plantar fasciitis.  No SI joint tenderness upon palpation.  He has clinically been doing well methotrexate 0.6 mL subcu as injections once weekly along with folic acid 1 mg daily.  He continues to tolerate methotrexate without any side effects or injection site reactions.  He has not had any interruptions in therapy.  No recent or recurrent infections.  He will remain on methotrexate as prescribed.  He will also remain on TVO as prescribed by gastroenterology for management of ulcerative colitis.  He has not noticed any new or worsening symptoms since initiating Entyvio and feels that it has been better controlling his ulcerative colitis. He was advised to notify us if he develops any signs or symptoms of a flare.  He will follow-up in the office in 5 months or sooner if needed.  High risk medication use - Methotrexate 0.6 ml weekly, folic acid 1 mg daily.  Entyvio IV infusions every 8 weeks by GI-resumed 10/09/22. Previously held methotrexate from August 2023 until March 2024.  CBC and CMP updated on 10/08/22.  Orders for CBC and CMP were released today. CXR on 06/03/22 no active cardiopulmonary disease No recent or  recurrent infections.  Discussed the importance of holding methotrexate if he develops signs or symptoms of an infection and to resume once the infection has completely cleared.   - Plan: CBC with Differential/Platelet, COMPLETE METABOLIC PANEL WITH GFR  Chronic right SI joint pain: Right SI joint cortisone injection performed on 10/30/2022.  No SI joint tenderness upon palpation.   Chronic right-sided low back pain without sciatica: He experiences intermittent discomfort in his lower back which is typically exacerbated by certain activities or positional changes.  A refill of methocarbamol 500 mg 1 tablet daily as needed for muscle spasms was sent to the pharmacy.   Positive TB test - Chest x-ray did not reveal any active cardiopulmonary disease on 06/03/22.  Other ulcerative colitis with other complication (HCC): Prescribed Entyvio IV infusions every 8 weeks.  Idiopathic chronic gout of multiple sites without tophus - He has not had any signs or symptoms of a gout flare.  Not currently taking a urate lowering medication.  Uric acid level be rechecked today.  Plan: Uric acid  History of bilateral rotator cuff tear repair: Good range of motion of the shoulder joints with some crepitus bilaterally.   Contracture of right elbow: No tenderness or inflammation noted.  Primary osteoarthritis of both hands: PIP and DIP thickening consistent with osteoarthritis of both hands.  CMC joint prominence.  No active inflammation noted on examination today.  Discussed the importance of joint protection and muscle strengthening.  History of total bilateral knee replacement - Dr. Ruben Reason well.  Good range of motion with no discomfort at this time.  Other medical conditions are listed as follows:  History of gastroesophageal reflux (GERD)    Orders: Orders Placed This Encounter  Procedures   CBC with Differential/Platelet   COMPLETE METABOLIC PANEL WITH GFR   Uric acid   Meds ordered this  encounter  Medications   methocarbamol (ROBAXIN) 500 MG tablet    Sig: Take 1 tablet (500 mg total) by mouth daily as needed for muscle spasms.    Dispense:  30 tablet    Refill:  0     Follow-Up Instructions: No follow-ups on file.   Gearldine Bienenstock, PA-C  Note - This record has been created using Dragon software.  Chart creation errors have been sought, but may not always  have been located. Such creation errors do not reflect on  the standard of medical care.

## 2023-01-01 ENCOUNTER — Ambulatory Visit: Payer: Medicare PPO | Admitting: Internal Medicine

## 2023-01-01 ENCOUNTER — Encounter: Payer: Self-pay | Admitting: Internal Medicine

## 2023-01-01 VITALS — BP 134/64 | HR 66 | Ht 71.0 in | Wt 218.4 lb

## 2023-01-01 DIAGNOSIS — M0600 Rheumatoid arthritis without rheumatoid factor, unspecified site: Secondary | ICD-10-CM | POA: Diagnosis not present

## 2023-01-01 DIAGNOSIS — K5100 Ulcerative (chronic) pancolitis without complications: Secondary | ICD-10-CM

## 2023-01-01 DIAGNOSIS — K219 Gastro-esophageal reflux disease without esophagitis: Secondary | ICD-10-CM

## 2023-01-01 NOTE — Progress Notes (Signed)
Subjective:    Patient ID: Christopher Smith, male    DOB: 1951-01-14, 72 y.o.   MRN: [id number]  HPI Christopher Smith is a 72 year old male with a history of longstanding pan ulcerative colitis (diagnosis 2011; previous treatment with infliximab, adalimumab, Cimzia and Stelara), currently on Entyvio since April 2023, seronegative arthritis, history of adenomatous polyps, GERD, latent TB having completed 8 months of isoniazid who is here for follow-up.  He was last seen on 04/19/2022.  He had a lapse in therapy because a try to switch him to [employer].  Cristy Folks was not ultimately approved and he restarted Entyvio.  He also stopped the methotrexate because he thought he would start Skyrizi which may cover his seronegative arthritis.  After being off both medicines he had a flare of his symptoms both with joint pains and lower abdominal pain and looser stool.  He has subsequently restarted both Entyvio by infusion with last infusion 12/04/2022.  He is also resumed methotrexate therapy.  He is feeling well.  Bowel movements have come under control.  No abdominal pain.  No blood in stool.  No mucus.  He remains very active.  [place].  He is having 2 bowel movements in the morning and 1 in the evening.  Joints are better with the methotrexate.  He is not using colestipol.  He is happy with current disease control.   Review of Systems As per HPI, otherwise negative  Current Medications, Allergies, Past Medical History, Past Surgical History, Family History and Social History were reviewed in [employer] record.    Objective:   Physical Exam BP 134/64   Pulse 66   Ht 5\' 11"  (1.803 m)   Wt 218 lb 6.4 oz (99.1 kg)   BMI 30.46 kg/m  Gen: awake, alert, NAD HEENT: anicteric  Abd: soft, NT/ND, +BS throughout Ext: no c/c/e Neuro: nonfocal     Latest Ref Rng & Units 10/08/2022    8:52 AM 05/09/2022    8:31 AM 11/19/2021    8:35 AM  CBC  WBC  3.8 - 10.8 Thousand/uL 7.9  6.7  7.3   Hemoglobin 13.2 - 17.1 g/dL 46.9  62.9  52.8   Hematocrit 38.5 - 50.0 % 44.1  43.8  43.6   Platelets 140 - 400 Thousand/uL 273  267  287    CMP     Component Value Date/Time   NA 138 10/08/2022 0852   NA 141 11/19/2016 0718   K 4.6 10/08/2022 0852   CL 102 10/08/2022 0852   CO2 29 10/08/2022 0852   GLUCOSE 109 (H) 10/08/2022 0852   BUN 16 10/08/2022 0852   BUN 14 11/19/2016 0718   CREATININE 0.78 10/08/2022 0852   CALCIUM 9.5 10/08/2022 0852   PROT 7.7 10/08/2022 0852   PROT 7.1 11/19/2016 0718   ALBUMIN 4.4 11/19/2016 0718   AST 18 10/08/2022 0852   ALT 20 10/08/2022 0852   ALKPHOS 65 11/19/2016 0718   BILITOT 0.7 10/08/2022 0852   BILITOT 0.5 11/19/2016 0718   GFR 109.21 01/11/2011 1014   EGFR 95 10/08/2022 0852   GFRNONAA 89 01/16/2021 1012   Fecal Cal Oct 2023 = 52      Assessment & Plan:  72 year old male with a history of longstanding pan ulcerative colitis (diagnosis 2011; previous treatment with infliximab, adalimumab, Cimzia and Stelara), currently on Entyvio since April 2023, seronegative arthritis, history of adenomatous polyps, GERD, latent TB having completed 8  months of isoniazid who is here for follow-up.  Pan ulcerative colitis (dx 2011, prior treatment with infliximab, adalimumab, Cimzia and Stelara; currently Entyvio since April 2023) --he is doing well and certainly had a flare of symptoms having a 29-month break of Entyvio.  He is come back under control.  His fecal calprotectin on Entyvio was almost normal and given his lack of symptoms we will continue current therapy.  He is happy with this plan -- Continue Entyvio 300 mg every 8 weeks -- Continue methotrexate weekly under the direction of Dr. Corliss Skains -- He has not needed colestipol and this can be discontinued -- Surveillance colonoscopy recommended next year, discuss at follow-up  2.  GERD --well-controlled on pantoprazole continue 40 mg daily  3.   Seronegative RA --following with rheumatology, on methotrexate  See me in about 6 months  30 minutes total spent today including patient facing time, coordination of care, reviewing medical history/procedures/pertinent radiology studies, and documentation of the encounter.

## 2023-01-01 NOTE — Patient Instructions (Addendum)
Continue pantoprazole Intyvio.   Follow up with Dr. Rhea Belton in 6 months.   _______________________________________________________  If your blood pressure at your visit was 140/90 or greater, please contact your primary care physician to follow up on this.  _______________________________________________________  If you are age 73 or older, your body mass index should be between 23-30. Your Body mass index is 30.46 kg/m. If this is out of the aforementioned range listed, please consider follow up with your Primary Care Provider.  If you are age 25 or younger, your body mass index should be between 19-25. Your Body mass index is 30.46 kg/m. If this is out of the aformentioned range listed, please consider follow up with your Primary Care Provider.   ________________________________________________________  The [facility] providers would like to encourage you to use [facility] to communicate with providers for non-urgent requests or questions.  Due to long hold times on the telephone, sending your provider a message by [facility] may be a faster and more efficient way to get a response.  Please allow 48 business hours for a response.  Please remember that this is for non-urgent requests.  _______________________________________________________

## 2023-01-08 ENCOUNTER — Ambulatory Visit: Payer: Medicare PPO | Attending: Physician Assistant | Admitting: Physician Assistant

## 2023-01-08 ENCOUNTER — Encounter: Payer: Self-pay | Admitting: Physician Assistant

## 2023-01-08 VITALS — BP 128/72 | HR 50 | Resp 17 | Ht 71.0 in | Wt 217.2 lb

## 2023-01-08 DIAGNOSIS — M24521 Contracture, right elbow: Secondary | ICD-10-CM

## 2023-01-08 DIAGNOSIS — M533 Sacrococcygeal disorders, not elsewhere classified: Secondary | ICD-10-CM

## 2023-01-08 DIAGNOSIS — K51818 Other ulcerative colitis with other complication: Secondary | ICD-10-CM

## 2023-01-08 DIAGNOSIS — R7611 Nonspecific reaction to tuberculin skin test without active tuberculosis: Secondary | ICD-10-CM | POA: Diagnosis not present

## 2023-01-08 DIAGNOSIS — Z96653 Presence of artificial knee joint, bilateral: Secondary | ICD-10-CM

## 2023-01-08 DIAGNOSIS — Z79899 Other long term (current) drug therapy: Secondary | ICD-10-CM | POA: Diagnosis not present

## 2023-01-08 DIAGNOSIS — M1990 Unspecified osteoarthritis, unspecified site: Secondary | ICD-10-CM | POA: Diagnosis not present

## 2023-01-08 DIAGNOSIS — M19041 Primary osteoarthritis, right hand: Secondary | ICD-10-CM

## 2023-01-08 DIAGNOSIS — Z8719 Personal history of other diseases of the digestive system: Secondary | ICD-10-CM

## 2023-01-08 DIAGNOSIS — M1A09X0 Idiopathic chronic gout, multiple sites, without tophus (tophi): Secondary | ICD-10-CM

## 2023-01-08 DIAGNOSIS — M19042 Primary osteoarthritis, left hand: Secondary | ICD-10-CM

## 2023-01-08 DIAGNOSIS — G8929 Other chronic pain: Secondary | ICD-10-CM

## 2023-01-08 DIAGNOSIS — M545 Low back pain, unspecified: Secondary | ICD-10-CM

## 2023-01-08 DIAGNOSIS — M12819 Other specific arthropathies, not elsewhere classified, unspecified shoulder: Secondary | ICD-10-CM

## 2023-01-08 LAB — CBC WITH DIFFERENTIAL/PLATELET
HCT: 43.8 % (ref 38.5–50.0)
Hemoglobin: 14.4 g/dL (ref 13.2–17.1)
Lymphs Abs: 1925 cells/uL (ref 850–3900)
MCV: 95.6 fL (ref 80.0–100.0)
MPV: 10.3 fL (ref 7.5–12.5)
Neutrophils Relative %: 63.3 %
RBC: 4.58 10*6/uL (ref 4.20–5.80)
RDW: 15 % (ref 11.0–15.0)
Total Lymphocyte: 25 %
WBC: 7.7 10*3/uL (ref 3.8–10.8)

## 2023-01-08 MED ORDER — METHOCARBAMOL 500 MG PO TABS: 500.0000 mg | ORAL_TABLET | Freq: Every day | ORAL | 0 refills | Status: DC | PRN

## 2023-01-09 LAB — COMPLETE METABOLIC PANEL WITH GFR
AG Ratio: 1.5 (calc) (ref 1.0–2.5)
ALT: 21 U/L (ref 9–46)
AST: 19 U/L (ref 10–35)
Albumin: 4.4 g/dL (ref 3.6–5.1)
Alkaline phosphatase (APISO): 57 U/L (ref 35–144)
BUN: 15 mg/dL (ref 7–25)
CO2: 28 mmol/L (ref 20–32)
Calcium: 9.3 mg/dL (ref 8.6–10.3)
Chloride: 102 mmol/L (ref 98–110)
Creat: 0.84 mg/dL (ref 0.70–1.28)
Globulin: 3 g/dL (calc) (ref 1.9–3.7)
Glucose, Bld: 98 mg/dL (ref 65–99)
Potassium: 4.7 mmol/L (ref 3.5–5.3)
Sodium: 140 mmol/L (ref 135–146)
Total Bilirubin: 0.8 mg/dL (ref 0.2–1.2)
Total Protein: 7.4 g/dL (ref 6.1–8.1)
eGFR: 93 mL/min/{1.73_m2} (ref >=60)

## 2023-01-09 LAB — CBC WITH DIFFERENTIAL/PLATELET
Absolute Monocytes: 670 cells/uL (ref 200–950)
Basophils Absolute: 31 cells/uL (ref 0–200)
Basophils Relative: 0.4 %
Eosinophils Absolute: 200 cells/uL (ref 15–500)
Eosinophils Relative: 2.6 %
MCH: 31.4 pg (ref 27.0–33.0)
MCHC: 32.9 g/dL (ref 32.0–36.0)
Monocytes Relative: 8.7 %
Neutro Abs: 4874 cells/uL (ref 1500–7800)
Platelets: 271 10*3/uL (ref 140–400)

## 2023-01-09 LAB — URIC ACID: Uric Acid, Serum: 3.7 mg/dL — ABNORMAL LOW (ref 4.0–8.0)

## 2023-01-09 NOTE — Progress Notes (Signed)
Uric acid remains in desirable range.  CBC and CMP WNL

## 2023-01-29 ENCOUNTER — Ambulatory Visit (INDEPENDENT_AMBULATORY_CARE_PROVIDER_SITE_OTHER): Payer: Medicare PPO | Admitting: *Deleted

## 2023-01-29 VITALS — BP 158/77 | HR 58 | Temp 97.5°F | Resp 16 | Ht 71.0 in | Wt 221.4 lb

## 2023-01-29 DIAGNOSIS — K5100 Ulcerative (chronic) pancolitis without complications: Secondary | ICD-10-CM

## 2023-01-29 MED ORDER — VEDOLIZUMAB 300 MG IV SOLR
300.0000 mg | Freq: Once | INTRAVENOUS | Status: AC
  Administered 2023-01-29: 300 mg via INTRAVENOUS
  Filled 2023-01-29: qty 5

## 2023-01-29 NOTE — Progress Notes (Signed)
Diagnosis: Ulcerative Colitis  Provider:  Chilton Greathouse MD  Procedure: IV Infusion  IV Type: Peripheral, IV Location: L Forearm  Entyvio (Vedolizumab), Dose: 300 mg  Infusion Start Time: 0931 am  Infusion Stop Time: 1002  am  Post Infusion IV Care: Observation period completed and Peripheral IV Discontinued  Discharge: Condition: Good, Destination: Home . AVS Provided  Performed by:  Forrest Moron, RN

## 2023-02-03 DIAGNOSIS — S61012A Laceration without foreign body of left thumb without damage to nail, initial encounter: Secondary | ICD-10-CM | POA: Diagnosis not present

## 2023-02-03 DIAGNOSIS — S61412A Laceration without foreign body of left hand, initial encounter: Secondary | ICD-10-CM | POA: Diagnosis not present

## 2023-02-21 ENCOUNTER — Other Ambulatory Visit: Payer: Self-pay | Admitting: Physician Assistant

## 2023-02-21 NOTE — Telephone Encounter (Signed)
Last Fill: 01/08/2023  Next Visit: 06/16/2023  Last Visit: 01/08/2023  Dx:  Chronic right-sided low back pain without sciatica   Current Dose per office note on 01/08/2023: methocarbamol 500 mg 1 tablet daily as needed for muscle spasms   Okay to refill Methocarbamol?

## 2023-03-05 DIAGNOSIS — I10 Essential (primary) hypertension: Secondary | ICD-10-CM | POA: Diagnosis not present

## 2023-03-05 DIAGNOSIS — R7989 Other specified abnormal findings of blood chemistry: Secondary | ICD-10-CM | POA: Diagnosis not present

## 2023-03-05 DIAGNOSIS — K219 Gastro-esophageal reflux disease without esophagitis: Secondary | ICD-10-CM | POA: Diagnosis not present

## 2023-03-05 DIAGNOSIS — Z125 Encounter for screening for malignant neoplasm of prostate: Secondary | ICD-10-CM | POA: Diagnosis not present

## 2023-03-05 DIAGNOSIS — R7303 Prediabetes: Secondary | ICD-10-CM | POA: Diagnosis not present

## 2023-03-05 DIAGNOSIS — E785 Hyperlipidemia, unspecified: Secondary | ICD-10-CM | POA: Diagnosis not present

## 2023-03-12 ENCOUNTER — Telehealth: Payer: Self-pay

## 2023-03-12 DIAGNOSIS — Z79899 Other long term (current) drug therapy: Secondary | ICD-10-CM | POA: Diagnosis not present

## 2023-03-12 DIAGNOSIS — K219 Gastro-esophageal reflux disease without esophagitis: Secondary | ICD-10-CM | POA: Diagnosis not present

## 2023-03-12 DIAGNOSIS — M109 Gout, unspecified: Secondary | ICD-10-CM | POA: Diagnosis not present

## 2023-03-12 DIAGNOSIS — E785 Hyperlipidemia, unspecified: Secondary | ICD-10-CM | POA: Diagnosis not present

## 2023-03-12 DIAGNOSIS — M1380 Other specified arthritis, unspecified site: Secondary | ICD-10-CM | POA: Diagnosis not present

## 2023-03-12 DIAGNOSIS — R7303 Prediabetes: Secondary | ICD-10-CM | POA: Diagnosis not present

## 2023-03-12 DIAGNOSIS — Z0000 Encounter for general adult medical examination without abnormal findings: Secondary | ICD-10-CM | POA: Diagnosis not present

## 2023-03-12 DIAGNOSIS — R42 Dizziness and giddiness: Secondary | ICD-10-CM | POA: Diagnosis not present

## 2023-03-12 DIAGNOSIS — I10 Essential (primary) hypertension: Secondary | ICD-10-CM | POA: Diagnosis not present

## 2023-03-12 NOTE — Patient Outreach (Signed)
  Care Coordination   In Person Provider Office Visit Note   03/12/2023 Name: JONATHEN CALLUM MRN: [id number] DOB: 1951/01/07  KERMITT MARCELLO is a 72 y.o. year old male who sees Irena Reichmann, [place] for primary care. I engaged with Cecille Amsterdam in the providers office today.  What matters to the patients health and wellness today?  none    Goals Addressed             This Visit's Progress    COMPLETED: Care Coordination Activities-No follow up required       Care Coordination Interventions: Discussed [facility] services and support. Assessed SDOH. Advised to discuss with primary care physician if services needed in the future.        SDOH assessments and interventions completed:  Yes     Care Coordination Interventions:  Yes, provided   Follow up plan: No further intervention required.   Encounter Outcome:  Pt. Visit Completed   Cline Draheim Idelle Jo, RN, MSN [facility]  [facility], [facility] Management Community Coordinator Direct Dial: [phone]  Fax: [phone] Website: [email or web address]

## 2023-03-12 NOTE — Patient Instructions (Signed)
Visit Information  Thank you for taking time to visit with me today. Please don't hesitate to contact me if I can be of assistance to you.   Following are the goals we discussed today:   Goals Addressed             This Visit's Progress    COMPLETED: Care Coordination Activities-No follow up required       Care Coordination Interventions: Discussed [facility] services and support. Assessed SDOH. Advised to discuss with primary care physician if services needed in the future.          If you are experiencing a Mental Health or Behavioral Health Crisis or need someone to talk to, please call the Suicide and Crisis Lifeline: 988   Patient verbalizes understanding of instructions and care plan provided today and agrees to view in MyChart. Active MyChart status and patient understanding of how to access instructions and care plan via MyChart confirmed with patient.     The patient has been provided with contact information for the care management team and has been advised to call with any health related questions or concerns.   Bary Leriche, RN, MSN [facility], [facility] Management Community Coordinator Direct Dial: [phone]  Fax: [phone] Website: [email or web address]

## 2023-03-13 ENCOUNTER — Other Ambulatory Visit: Payer: Self-pay | Admitting: Rheumatology

## 2023-03-13 NOTE — Telephone Encounter (Signed)
Last Fill: 12/11/2022  Labs: 01/08/2023 Uric acid remains in desirable range. CBC and CMP WNL  Next Visit: 06/16/2023  Last Visit: 01/08/2023  DX: Idiopathic chronic gout of multiple sites without tophus   Current Dose per office note 01/08/2023: not discussed  Okay to refill Allopurinol?

## 2023-03-26 ENCOUNTER — Ambulatory Visit (INDEPENDENT_AMBULATORY_CARE_PROVIDER_SITE_OTHER): Payer: Medicare PPO

## 2023-03-26 VITALS — BP 153/81 | HR 57 | Temp 97.5°F | Resp 16 | Ht 71.0 in | Wt 222.0 lb

## 2023-03-26 DIAGNOSIS — K5100 Ulcerative (chronic) pancolitis without complications: Secondary | ICD-10-CM | POA: Diagnosis not present

## 2023-03-26 MED ORDER — VEDOLIZUMAB 300 MG IV SOLR
300.0000 mg | Freq: Once | INTRAVENOUS | Status: AC
  Administered 2023-03-26: 300 mg via INTRAVENOUS
  Filled 2023-03-26: qty 5

## 2023-03-26 NOTE — Progress Notes (Signed)
Diagnosis: Ulcerative pancolitis without complication   Provider:  Chilton Greathouse MD  Procedure: IV Infusion  IV Type: Peripheral, IV Location: L Forearm  Entyvio (Vedolizumab), Dose: 300 mg  Infusion Start Time: 0932  Infusion Stop Time: 1002  Post Infusion IV Care: Patient declined observation and Peripheral IV Discontinued  Discharge: Condition: Good, Destination: Home . AVS Declined  Performed by:  Rico Ala, LPN

## 2023-04-18 ENCOUNTER — Other Ambulatory Visit: Payer: Self-pay | Admitting: Internal Medicine

## 2023-04-18 ENCOUNTER — Other Ambulatory Visit: Payer: Self-pay | Admitting: Rheumatology

## 2023-04-18 DIAGNOSIS — K219 Gastro-esophageal reflux disease without esophagitis: Secondary | ICD-10-CM

## 2023-04-18 MED ORDER — METHOCARBAMOL 500 MG PO TABS: 500.0000 mg | ORAL_TABLET | Freq: Every day | ORAL | 0 refills | Status: DC | PRN

## 2023-04-18 NOTE — Telephone Encounter (Signed)
Last Fill: 02/21/2023  Next Visit: 06/16/2023  Last Visit: 01/08/2023  Dx: Chronic right-sided low back pain without sciatica   Current Dose per office note on 01/08/2023: methocarbamol 500 mg 1 tablet daily as needed for muscle spasms   Okay to refill Methocarbamol?

## 2023-04-30 ENCOUNTER — Encounter: Payer: Self-pay | Admitting: Internal Medicine

## 2023-05-08 ENCOUNTER — Other Ambulatory Visit: Payer: Self-pay | Admitting: Physician Assistant

## 2023-05-08 DIAGNOSIS — Z79899 Other long term (current) drug therapy: Secondary | ICD-10-CM

## 2023-05-08 NOTE — Telephone Encounter (Signed)
Last Fill: 10/09/2022  Labs: 01/08/2023 CBC and CMP WNL   Next Visit: 06/16/2023  Last Visit: 01/08/2023  DX: Chronic inflammatory arthritis   Current Dose per office note 01/08/2023: Methotrexate 0.6 ml weekly   Left message to advise patient he is due to update labs.   Okay to refill Methotrexate?

## 2023-05-15 ENCOUNTER — Telehealth: Payer: Self-pay | Admitting: Pharmacy Technician

## 2023-05-15 NOTE — Telephone Encounter (Signed)
Auth Submission: APPROVED Site of care: Site of care: [facility] Payer: [employer] Medication & CPT/J Code(s) submitted: Entyvio (Vedolizumab) [id number] Route of submission (phone, fax, portal):  Phone # Fax # Auth type: Buy/Bill PB Units/visits requested: 300MG  [id number] Reference number: [id number] Approval from: 07/16/23 to 07/14/24

## 2023-05-22 ENCOUNTER — Ambulatory Visit: Payer: Medicare PPO

## 2023-05-22 VITALS — BP 142/77 | HR 61 | Temp 97.9°F | Resp 14 | Ht 71.0 in | Wt 224.2 lb

## 2023-05-22 DIAGNOSIS — K5100 Ulcerative (chronic) pancolitis without complications: Secondary | ICD-10-CM | POA: Diagnosis not present

## 2023-05-22 MED ORDER — SODIUM CHLORIDE 0.9 % IV SOLN
300.0000 mg | Freq: Once | INTRAVENOUS | Status: AC
  Administered 2023-05-22: 300 mg via INTRAVENOUS
  Filled 2023-05-22: qty 5

## 2023-05-22 NOTE — Progress Notes (Signed)
Diagnosis: Ulcerative Colitis  Provider:  Chilton Greathouse MD  Procedure: IV Infusion  IV Type: Peripheral, IV Location: L Antecubital  Entyvio (Vedolizumab), Dose: 300 mg  Infusion Start Time: 0900  Infusion Stop Time: 0933  Post Infusion IV Care: Patient declined observation and Peripheral IV Discontinued  Discharge: Condition: Stable, Destination: Home . AVS Declined  Performed by:  Wyvonne Lenz, RN

## 2023-05-26 ENCOUNTER — Other Ambulatory Visit: Payer: Self-pay | Admitting: Physician Assistant

## 2023-05-26 NOTE — Telephone Encounter (Signed)
Last Fill: 04/18/2023   Next Visit: 06/16/2023   Last Visit: 01/08/2023   Dx: Chronic right-sided low back pain without sciatica    Current Dose per office note on 01/08/2023: methocarbamol 500 mg 1 tablet daily as needed for muscle spasms    Okay to refill Methocarbamol?

## 2023-06-02 NOTE — Progress Notes (Unsigned)
Office Visit Note  Patient: Christopher Smith             Date of Birth: Mar 28, 1951           MRN: [id number]             PCP: Irena Reichmann, DO Referring: Irena Reichmann, DO Visit Date: 06/16/2023 Occupation: @GUAROCC @  Subjective:  Chronic pain   History of Present Illness: Christopher Smith is a 72 y.o. male with history of chronic inflammatory arthritis and ulcerative colitis.  Patient remains on Methotrexate 0.6 ml weekly and folic acid 1 mg daily. He is also receiving Entyvio IV infusions every 8 weeks prescribed by GI-resumed 10/09/22.  He had a follow-up visit scheduled with Dr. Rhea Belton on 06/10/2023.  He is planning to have a repeat colonoscopy performed in February 2025.  He has intermittent minor flares but overall his UC has been well controlled.   He presents today with ongoing pain involving multiple joints.  He experiences intermittent discomfort in his shoulders, both hands, both knees, and his neck.  He has been experiencing increased nocturnal pain especially in his lower back and shoulders.  He has also noticed limited range of motion with his neck especially while driving.  He denies any joint swelling at this time. Patient would like a referral to pain management as previously discussed.   Activities of Daily Living:  Patient reports morning stiffness for 15 minutes.   Patient Reports nocturnal pain.  Difficulty dressing/grooming: Reports Difficulty climbing stairs: Reports Difficulty getting out of chair: Reports Difficulty using hands for taps, buttons, cutlery, and/or writing: Reports  Review of Systems  Constitutional:  Positive for fatigue.  HENT:  Negative for mouth sores and mouth dryness.   Eyes:  Negative for dryness.  Respiratory:  Negative for shortness of breath.   Cardiovascular:  Negative for chest pain and palpitations.  Gastrointestinal:  Positive for diarrhea. Negative for blood in stool and constipation.  Endocrine: Positive for increased urination.   Genitourinary:  Negative for involuntary urination.  Musculoskeletal:  Positive for joint pain, gait problem, joint pain, joint swelling, myalgias, morning stiffness, muscle tenderness and myalgias. Negative for muscle weakness.  Skin:  Negative for color change, rash, hair loss and sensitivity to sunlight.  Allergic/Immunologic: Negative for susceptible to infections.  Neurological:  Positive for dizziness. Negative for headaches.  Hematological:  Negative for swollen glands.  Psychiatric/Behavioral:  Positive for sleep disturbance. Negative for depressed mood. The patient is not nervous/anxious.     PMFS History:  Patient Active Problem List   Diagnosis Date Noted   Open fracture of one or more phalanges of hand 11/21/2017   Chronic inflammatory arthritis 08/05/2016   High risk medication use 08/05/2016   Contracture of elbow, right 08/05/2016   History of total knee replacement, bilateral 08/05/2016   Idiopathic chronic gout 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 tophus 08/05/2016   History of bilateral rotator cuff tear repair 08/05/2016   Gastroesophageal reflux  08/05/2016   Latent tuberculosis by blood test 08/05/2016   Ulcerative colitis (HCC) 09/03/2011   Glucose intolerance (impaired glucose tolerance) 09/03/2011   Severe diarrhea 09/03/2011   URI (upper respiratory infection) 07/02/2011   Bile salt-induced diarrhea 07/02/2011   S/P cholecystectomy 07/02/2011   Ulcerative colitis (HCC) 04/03/2011   Benign neoplasm of colon 04/03/2011   Positive TB test 03/22/2011   Insomnia due to medical condition 03/22/2011   UC (ulcerative colitis) (HCC) 03/01/2011   Leg cramps 03/01/2011   Ulcerative colitis with rectal bleeding (  HCC) 01/24/2011   Nonspecific abnormal finding in stool contents 01/24/2011   Heme positive stool 01/11/2011   Diarrhea 01/11/2011   Arthritis associated with inflammatory bowel disease 01/11/2011   Intentional weight loss 01/11/2011    Past Medical History:  Diagnosis Date    Allergy    Anal fissure    Arthritis    RA   Blood transfusion without reported diagnosis    COVID-19 06/2020   Diabetes mellitus    no meds taken 05-01-16- elevated glucose was from predisone for UC   Diarrhea    Fatty liver    GERD (gastroesophageal reflux disease)    Gout    Hiatal hernia    Hypertension    Internal hemorrhoids    Polyarthritis    PONV (postoperative nausea and vomiting)    Sessile rectal polyp 03/27/2011   Sinusitis    Tuberculosis    positive PPD- had an allergic reaction to the PPD, gets yearly chest xray   Tubular adenoma of colon    Ulcerative colitis 01/24/2011    Family History  Problem Relation Age of Onset   Alzheimer's disease Father    Stroke Mother    Cancer Mother        liver & breast    Breast cancer Mother    Liver cancer Mother    Rheum arthritis Brother    Rheum arthritis Brother    Rheum arthritis Sister    Rheum arthritis Sister    Breast cancer Sister    Rheum arthritis Sister    Breast cancer Sister    Colon cancer Neg Hx    Esophageal cancer Neg Hx    Stomach cancer Neg Hx    Rectal cancer Neg Hx    Colon polyps Neg Hx    Past Surgical History:  Procedure Laterality Date   CHOLECYSTECTOMY     COLONOSCOPY     KNEE SURGERY     Bilateral    POLYPECTOMY     SHOULDER SURGERY Bilateral    Bilateral - rotator cuff repair    TOTAL KNEE ARTHROPLASTY Bilateral    Social History   Social History Narrative   1 caffeine drink daily       2 sons and a step daughter   Immunization History  Administered Date(s) Administered   Influenza, High Dose Seasonal PF 04/28/2019   Moderna Sars-Covid-2 Vaccination 09/08/2019, 10/06/2019   Tdap 11/19/2017     Objective: Vital Signs: BP 123/73 (BP Location: Left Arm, Patient Position: Sitting, Cuff Size: Normal)   Pulse (!) 54   Resp 16   Ht 5\' 11"  (1.803 m)   Wt 224 lb 3.2 oz (101.7 kg)   BMI 31.27 kg/m    Physical Exam Vitals and nursing note reviewed.  Constitutional:       Appearance: He is well-developed.  HENT:     Head: Normocephalic and atraumatic.  Eyes:     Conjunctiva/sclera: Conjunctivae normal.     Pupils: Pupils are equal, round, and reactive to light.  Cardiovascular:     Rate and Rhythm: Normal rate and regular rhythm.     Heart sounds: Normal heart sounds.  Pulmonary:     Effort: Pulmonary effort is normal.     Breath sounds: Normal breath sounds.  Abdominal:     General: Bowel sounds are normal.     Palpations: Abdomen is soft.  Musculoskeletal:     Cervical back: Normal range of motion and neck supple.  Skin:    General:  Skin is warm and dry.     Capillary Refill: Capillary refill takes less than 2 seconds.  Neurological:     Mental Status: He is alert and oriented to person, place, and time.  Psychiatric:        Behavior: Behavior normal.      Musculoskeletal Exam: C-spine has limited range of motion with lateral rotation.  No midline spinal tenderness.  No SI joint tenderness.  Left shoulder has painful range of motion especially with abduction.  Right shoulder has full range of motion with some discomfort and stiffness.  Right elbow flexion contracture noted.  CMC, PIP, DIP thickening noted.  Thickening of MCP joints but no active synovitis.  Hip joints have good range of motion with no groin pain.  Bilateral knee replacements have good range of motion with warmth in the left knee.  Ankle joints have good range of motion with no tenderness or joint swelling.  CDAI Exam: CDAI Score: -- Patient Global: --; Provider Global: -- Swollen: --; Tender: -- Joint Exam 06/16/2023   No joint exam has been documented for this visit   There is currently no information documented on the homunculus. Go to the Rheumatology activity and complete the homunculus joint exam.  Investigation: No additional findings.  Imaging: No results found.  Recent Labs: Lab Results  Component Value Date   WBC 7.7 01/08/2023   HGB 14.4 01/08/2023    PLT 271 01/08/2023   NA 140 01/08/2023   K 4.7 01/08/2023   CL 102 01/08/2023   CO2 28 01/08/2023   GLUCOSE 98 01/08/2023   BUN 15 01/08/2023   CREATININE 0.84 01/08/2023   BILITOT 0.8 01/08/2023   ALKPHOS 65 11/19/2016   AST 19 01/08/2023   ALT 21 01/08/2023   PROT 7.4 01/08/2023   ALBUMIN 4.4 11/19/2016   CALCIUM 9.3 01/08/2023   GFRAA 103 01/16/2021   QFTBGOLDPLUS CANCELED 10/10/2021    Speciality Comments: Last Dexa Scan 03/21/2020  Prior therapy: Humira, Remicaide, Cimzia, Stelara (inadequate response)  Procedures:  No procedures performed Allergies: Patient has no known allergies.    Assessment / Plan:     Visit Diagnoses: Chronic inflammatory arthritis - Inflammatory, erosive: He has no synovitis on examination today.  He continues to have chronic pain involving multiple joints including both shoulders, both hands, and both knee replacements.  He has also been experiencing limited range of motion with lateral rotation of the C-spine as well as discomfort in his lower back especially at night.  He remains on methotrexate 0.6 mL sq injections once weekly, folic acid 2 mg daily, and entyvio IV infusions every 8 weeks.  He is tolerating combination therapy.  No recent or recurrent infections.  He has not noticed any joint swelling and no inflammation was noted on examination today.  Plan to refer the patient to pain management as requested.  No medication changes will be made at this time.  He was advised to notify us if he develops signs or symptoms of a flare.  He will follow-up in the office in 5 months or sooner if needed. - Plan: Ambulatory referral to Physical Medicine Rehab  High risk medication use - Methotrexate 0.6 ml weekly, folic acid 1 mg daily.  He remains on Entyvio IV infusions every 8 weeks by GI-resumed 10/09/22. Previously held MTX August 2023 until March 2024. CBC and CMP WNL on 01/08/23. Orders for CBC and CMP released today.   No recent or recurrent infections.   Discussed the importance of  holding methotrexate if he develops signs or symptoms of an infection and to resume once the infection has completely cleared.   - Plan: CBC with Differential/Platelet, COMPLETE METABOLIC PANEL WITH GFR  Positive TB test - Chest x-ray did not reveal any active cardiopulmonary disease on 06/03/22.  Other ulcerative colitis with other complication (HCC) - Under the care of Dr. Rhea Belton. Diagnosis 2011, prior treatment with infliximab, adalimumab, Cimzia and Stelara; currently Entyvio since April 2023   Reviewed office visit note from 06/10/2023.  Planning to update colonoscopy in February 2025.  He will remain on Entyvio IV infusions every 8 weeks.  Idiopathic chronic gout of multiple sites without tophus: He has not had any signs or symptoms of a gout flare.  He has clinically been doing well taking allopurinol 300 mg 1 tablet by mouth daily.  Uric acid level was 3.7 on 01/08/2023.  Plan to update uric acid today.  History of bilateral rotator cuff tear repair: He has been experiencing recurrence of pain in both shoulders especially at night.  On examination he has painful range of motion with stiffness in both shoulders especially the left shoulder.  Plan to refer the patient to pain management.  Contracture of right elbow: Unchanged.  No tenderness or inflammation noted today.  Primary osteoarthritis of both hands -He continues to have chronic pain involving both hands.  CMC, PIP, DIP thickening consistent with osteoarthritis of both hands noted.  Thickening of MCP joints but no active synovitis noted.  He continues to [employer] as a hobby.   He has not noticed any joint swelling but continues to have chronic pain involving both hands.  Plan to refer the patient to pain management.  Plan: Ambulatory referral to Physical Medicine Rehab  History of total bilateral knee replacement - Dr. Sherlean Foot: Chronic pain.  Warmth of the left knee noted.  Chronic right-sided low back pain  without sciatica - He continues to have ongoing discomfort in his lower back.  He experiences intermittent discomfort in his lower back especially at night.  On examination he has no midline spinal tenderness or SI joint tenderness upon palpation.  Plan to refer the patient to pain management.  Plan: Ambulatory referral to Physical Medicine Rehab  Chronic right SI joint pain - Right SI joint cortisone injection performed on 10/30/2022. No SI joint tenderness upon palpation.  - Plan: Ambulatory referral to Physical Medicine Rehab  Other medical conditions are listed as follows:   History of gastroesophageal reflux (GERD)    Orders: Orders Placed This Encounter  Procedures   CBC with Differential/Platelet   COMPLETE METABOLIC PANEL WITH GFR   Uric acid   Ambulatory referral to Physical Medicine Rehab   No orders of the defined types were placed in this encounter.    Follow-Up Instructions: Return in about 5 months (around 11/14/2023) for Chronic inflammatory arthritis .   Christopher Bienenstock, PA-C  Note - This record has been created using Dragon software.  Chart creation errors have been sought, but may not always  have been located. Such creation errors do not reflect on  the standard of medical care.

## 2023-06-10 ENCOUNTER — Encounter: Payer: Self-pay | Admitting: Internal Medicine

## 2023-06-10 ENCOUNTER — Other Ambulatory Visit: Payer: Self-pay

## 2023-06-10 ENCOUNTER — Ambulatory Visit: Payer: Medicare PPO | Admitting: Internal Medicine

## 2023-06-10 VITALS — BP 130/70 | HR 68 | Ht 71.0 in | Wt 226.0 lb

## 2023-06-10 DIAGNOSIS — K219 Gastro-esophageal reflux disease without esophagitis: Secondary | ICD-10-CM | POA: Diagnosis not present

## 2023-06-10 DIAGNOSIS — K5100 Ulcerative (chronic) pancolitis without complications: Secondary | ICD-10-CM

## 2023-06-10 DIAGNOSIS — M0600 Rheumatoid arthritis without rheumatoid factor, unspecified site: Secondary | ICD-10-CM | POA: Diagnosis not present

## 2023-06-10 MED ORDER — NA SULFATE-K SULFATE-MG SULF 17.5-3.13-1.6 GM/177ML PO SOLN: 1.0000 | ORAL | 0 refills | Status: DC

## 2023-06-10 NOTE — Patient Instructions (Addendum)
You have been scheduled for a colonoscopy. Please follow written instructions given to you at your visit today.   Please pick up your prep supplies at the pharmacy within the next 1-3 days.  If you use inhalers (even only as needed), please bring them with you on the day of your procedure.  DO NOT TAKE 7 DAYS PRIOR TO TEST- Trulicity (dulaglutide) Ozempic, Wegovy (semaglutide) Mounjaro (tirzepatide) Bydureon Bcise (exanatide extended release)  DO NOT TAKE 1 DAY PRIOR TO YOUR TEST Rybelsus (semaglutide) Adlyxin (lixisenatide) Victoza (liraglutide) Byetta (exanatide) ___________________________________________________________________________  We have sent the following medications to your pharmacy for you to pick up at your convenience: Suprep   Continue Pantoprazole and Entyvio as directed.   Due to recent changes in healthcare laws, you may see the results of your imaging and laboratory studies on MyChart before your provider has had a chance to review them.  We understand that in some cases there may be results that are confusing or concerning to you. Not all laboratory results come back in the same time frame and the provider may be waiting for multiple results in order to interpret others.  Please give [place] 48 hours in order for your provider to thoroughly review all the results before contacting the office for clarification of your results.   Thank you for choosing me and [facility].  Dr. Vonna Kotyk Pyrtle

## 2023-06-10 NOTE — Progress Notes (Signed)
Subjective:    Patient ID: Christopher Smith, male    DOB: 1950/09/04, 72 y.o.   MRN: [id number]  HPI Christopher Smith is a 72 year old male with a history of longstanding pan ulcerative colitis (diagnosis 2011; previous treatment with infliximab, adalimumab, Cimzia and Stelara), currently on Entyvio since April 2023, seronegative arthritis, history of adenomatous polyps, GERD, latent TB having completed 8 months of isoniazid who is here for follow-up.  He was last seen on 01/01/2023.  He is here alone today.  He reports on the whole he is doing fairly well.  He will have flares of lower abdominal pain with more liquid type stool which last 2 to 4 days at a time.  He is not convinced that this necessarily happens towards the end of his Entyvio dosing interval.  He is tolerating Entyvio infusions.  No blood in stool or melena.  He remains very active.  Continues methotrexate for his arthritis.  Still deals with arthritic pains but over all is doing well.  No blood in stool or melena.  Feels like his colitis is about as good as it can be controlled.   Review of Systems As per HPI, otherwise negative  Current Medications, Allergies, Past Medical History, Past Surgical History, Family History and Social History were reviewed in [employer] record.    Objective:   Physical Exam BP 130/70   Pulse 68   Ht 5\' 11"  (1.803 m)   Wt 226 lb (102.5 kg)   BMI 31.52 kg/m  Gen: awake, alert, NAD HEENT: anicteric  CV: RRR, no mrg Pulm: CTA b/l Abd: soft, NT/ND, +BS throughout Ext: no c/c/e Neuro: nonfocal     Latest Ref Rng & Units 01/08/2023    8:41 AM 10/08/2022    8:52 AM 05/09/2022    8:31 AM  CBC  WBC 3.8 - 10.8 Thousand/uL 7.7  7.9  6.7   Hemoglobin 13.2 - 17.1 g/dL 46.9  62.9  52.8   Hematocrit 38.5 - 50.0 % 43.8  44.1  43.8   Platelets 140 - 400 Thousand/uL 271  273  267    CMP     Component Value Date/Time   NA 140 01/08/2023 0841   NA 141 11/19/2016 0718   K 4.7  01/08/2023 0841   CL 102 01/08/2023 0841   CO2 28 01/08/2023 0841   GLUCOSE 98 01/08/2023 0841   BUN 15 01/08/2023 0841   BUN 14 11/19/2016 0718   CREATININE 0.84 01/08/2023 0841   CALCIUM 9.3 01/08/2023 0841   PROT 7.4 01/08/2023 0841   PROT 7.1 11/19/2016 0718   ALBUMIN 4.4 11/19/2016 0718   AST 19 01/08/2023 0841   ALT 21 01/08/2023 0841   ALKPHOS 65 11/19/2016 0718   BILITOT 0.8 01/08/2023 0841   BILITOT 0.5 11/19/2016 0718   GFR 109.21 01/11/2011 1014   EGFR 93 01/08/2023 0841   GFRNONAA 89 01/16/2021 1012       Assessment & Plan:  72 year old male with a history of longstanding pan ulcerative colitis (diagnosis 2011; previous treatment with infliximab, adalimumab, Cimzia and Stelara), currently on Entyvio since April 2023, seronegative arthritis, history of adenomatous polyps, GERD, latent TB having completed 8 months of isoniazid who is here for follow-up.  Pan ulcerative colitis (diagnosis 2011, prior treatment with infliximab, adalimumab, Cimzia and Stelara; currently Entyvio since April 2023) --he is doing overall well though he is having periodic possible "mini flares" of his colitis.  Entyvio on the whole is done well and  his fecal calprotectin has certainly improved. -- Repeat fecal calprotectin -- Colonoscopy in the [facility] in February for surveillance -- Continue Entyvio 300 mg every 8 weeks though he will pay attention to see if symptoms seem to worsen prior to dosing if so we should consider going to every 6 weeks of therapy  2.  GERD --well-controlled on pantoprazole continue 40 mg daily  3.  Seronegative rheumatoid arthritis --following with rheumatology and on methotrexate  30 minutes total spent today including patient facing time, coordination of care, reviewing medical history/procedures/pertinent radiology studies, and documentation of the encounter.

## 2023-06-16 ENCOUNTER — Encounter: Payer: Self-pay | Admitting: Physician Assistant

## 2023-06-16 ENCOUNTER — Ambulatory Visit: Payer: Medicare PPO | Attending: Physician Assistant | Admitting: Physician Assistant

## 2023-06-16 VITALS — BP 123/73 | HR 54 | Resp 16 | Ht 71.0 in | Wt 224.2 lb

## 2023-06-16 DIAGNOSIS — M19041 Primary osteoarthritis, right hand: Secondary | ICD-10-CM

## 2023-06-16 DIAGNOSIS — Z8719 Personal history of other diseases of the digestive system: Secondary | ICD-10-CM

## 2023-06-16 DIAGNOSIS — M24521 Contracture, right elbow: Secondary | ICD-10-CM

## 2023-06-16 DIAGNOSIS — R7611 Nonspecific reaction to tuberculin skin test without active tuberculosis: Secondary | ICD-10-CM

## 2023-06-16 DIAGNOSIS — M1990 Unspecified osteoarthritis, unspecified site: Secondary | ICD-10-CM

## 2023-06-16 DIAGNOSIS — Z96653 Presence of artificial knee joint, bilateral: Secondary | ICD-10-CM

## 2023-06-16 DIAGNOSIS — M1A09X0 Idiopathic chronic gout, multiple sites, without tophus (tophi): Secondary | ICD-10-CM

## 2023-06-16 DIAGNOSIS — Z79899 Other long term (current) drug therapy: Secondary | ICD-10-CM

## 2023-06-16 DIAGNOSIS — M533 Sacrococcygeal disorders, not elsewhere classified: Secondary | ICD-10-CM | POA: Diagnosis not present

## 2023-06-16 DIAGNOSIS — M19042 Primary osteoarthritis, left hand: Secondary | ICD-10-CM

## 2023-06-16 DIAGNOSIS — K51818 Other ulcerative colitis with other complication: Secondary | ICD-10-CM | POA: Diagnosis not present

## 2023-06-16 DIAGNOSIS — G8929 Other chronic pain: Secondary | ICD-10-CM

## 2023-06-16 DIAGNOSIS — M545 Low back pain, unspecified: Secondary | ICD-10-CM

## 2023-06-16 DIAGNOSIS — M12819 Other specific arthropathies, not elsewhere classified, unspecified shoulder: Secondary | ICD-10-CM

## 2023-06-16 NOTE — Patient Instructions (Signed)
Standing Labs We placed an order today for your standing lab work.   Please have your standing labs drawn in march and every 3 months   Please have your labs drawn 2 weeks prior to your appointment so that the provider can discuss your lab results at your appointment, if possible.  Please note that you may see your imaging and lab results in MyChart before we have reviewed them. We will contact you once all results are reviewed. Please allow our office up to 72 hours to thoroughly review all of the results before contacting the office for clarification of your results.  WALK-IN LAB HOURS  Monday through Thursday from 8:00 am -12:30 pm and 1:00 pm-5:00 pm and Friday from 8:00 am-12:00 pm.  Patients with office visits requiring labs will be seen before walk-in labs.  You may encounter longer than normal wait times. Please allow additional time. Wait times may be shorter on  Monday and Thursday afternoons.  We do not book appointments for walk-in labs. We appreciate your patience and understanding with our staff.   Labs are drawn by [facility]. Please bring your co-pay at the time of your lab draw.  You may receive a bill from [facility] for your lab work.  Please note if you are on Hydroxychloroquine and and an order has been placed for a Hydroxychloroquine level,  you will need to have it drawn 4 hours or more after your last dose.  If you wish to have your labs drawn at another location, please call the office 24 hours in advance so we can fax the orders.  The office is located at [address], [address], [place], [place] [address]   If you have any questions regarding directions or hours of operation,  please call [phone].   As a reminder, please drink plenty of water prior to coming for your lab work. Thanks!

## 2023-06-16 NOTE — Progress Notes (Signed)
CBC WNL

## 2023-06-17 LAB — CBC WITH DIFFERENTIAL/PLATELET
Absolute Lymphocytes: 2066 {cells}/uL (ref 850–3900)
Absolute Monocytes: 804 {cells}/uL (ref 200–950)
Basophils Absolute: 33 {cells}/uL (ref 0–200)
Basophils Relative: 0.4 %
Eosinophils Absolute: 221 {cells}/uL (ref 15–500)
Eosinophils Relative: 2.7 %
HCT: 44.7 % (ref 38.5–50.0)
Hemoglobin: 14.6 g/dL (ref 13.2–17.1)
MCH: 30.7 pg (ref 27.0–33.0)
MCHC: 32.7 g/dL (ref 32.0–36.0)
MCV: 94.1 fL (ref 80.0–100.0)
MPV: 11.3 fL (ref 7.5–12.5)
Monocytes Relative: 9.8 %
Neutro Abs: 5076 {cells}/uL (ref 1500–7800)
Neutrophils Relative %: 61.9 %
Platelets: 265 10*3/uL (ref 140–400)
RBC: 4.75 10*6/uL (ref 4.20–5.80)
RDW: 14.2 % (ref 11.0–15.0)
Total Lymphocyte: 25.2 %
WBC: 8.2 10*3/uL (ref 3.8–10.8)

## 2023-06-17 LAB — COMPLETE METABOLIC PANEL WITH GFR
AG Ratio: 1.6 (calc) (ref 1.0–2.5)
ALT: 31 U/L (ref 9–46)
AST: 26 U/L (ref 10–35)
Albumin: 4.6 g/dL (ref 3.6–5.1)
Alkaline phosphatase (APISO): 54 U/L (ref 35–144)
BUN: 16 mg/dL (ref 7–25)
CO2: 30 mmol/L (ref 20–32)
Calcium: 9.4 mg/dL (ref 8.6–10.3)
Chloride: 102 mmol/L (ref 98–110)
Creat: 0.86 mg/dL (ref 0.70–1.28)
Globulin: 2.9 g/dL (ref 1.9–3.7)
Glucose, Bld: 105 mg/dL — ABNORMAL HIGH (ref 65–99)
Potassium: 4.8 mmol/L (ref 3.5–5.3)
Sodium: 139 mmol/L (ref 135–146)
Total Bilirubin: 0.8 mg/dL (ref 0.2–1.2)
Total Protein: 7.5 g/dL (ref 6.1–8.1)
eGFR: 91 mL/min/{1.73_m2} (ref >=60)

## 2023-06-17 LAB — URIC ACID: Uric Acid, Serum: 4.8 mg/dL (ref 4.0–8.0)

## 2023-06-17 NOTE — Progress Notes (Signed)
CMP WNL.  Uric acid WNL.

## 2023-07-01 ENCOUNTER — Encounter: Payer: Self-pay | Admitting: Physical Medicine & Rehabilitation

## 2023-07-02 ENCOUNTER — Other Ambulatory Visit: Payer: Self-pay | Admitting: Physician Assistant

## 2023-07-02 NOTE — Telephone Encounter (Signed)
Last Fill: 05/08/2023 (30 day supply)  Labs: 06/16/2023 CBC WNL CMP WNL   Next Visit: 11/12/2023  Last Visit: 06/16/2023  DX: Chronic inflammatory arthritis   Current Dose per office note 06/16/2023: Methotrexate 0.6 ml weekly   Okay to refill Methotrexate?

## 2023-07-03 ENCOUNTER — Encounter: Payer: Self-pay | Admitting: Physical Medicine & Rehabilitation

## 2023-07-03 ENCOUNTER — Encounter: Payer: Medicare PPO | Attending: Physical Medicine & Rehabilitation | Admitting: Physical Medicine & Rehabilitation

## 2023-07-03 VITALS — BP 130/68 | HR 60 | Ht 71.0 in | Wt 222.0 lb

## 2023-07-03 DIAGNOSIS — M47817 Spondylosis without myelopathy or radiculopathy, lumbosacral region: Secondary | ICD-10-CM | POA: Insufficient documentation

## 2023-07-03 DIAGNOSIS — M533 Sacrococcygeal disorders, not elsewhere classified: Secondary | ICD-10-CM | POA: Insufficient documentation

## 2023-07-03 MED ORDER — DULOXETINE HCL 20 MG PO CPEP: 20.0000 mg | ORAL_CAPSULE | Freq: Every day | ORAL | 1 refills | Status: DC

## 2023-07-03 NOTE — Progress Notes (Signed)
Subjective:    Patient ID: Christopher Smith, male    DOB: 22-Mar-1951, 72 y.o.   MRN: [id number]  HPI Chief complaint right sided low back pain 72 year old male with history of inflammatory bowel disease as well as rotator cuff disease, history of osteoarthritis status post bilateral knee replacements as well as chronic neck pain. First thing in am pain , constant pain through out the day.  Right side low back is the worst  The patient has been following with rheumatology for these issues for the last 20 years.  He has been on disease modifying drugs for his inflammatory bowel disease including methotrexate as well as vedolizamab. Patient has not been on any prescription painkillers. He has not tried physical therapy The patient has tried right sided palpation guided SI injection with corticosteroid last April without relief has not had fluoroscopy guided injections. Imaging studies reviewed including pelvic and lumbosacral x-rays Pain is described as sharp stabbing aching last employed 2018 the patient does no regular exercise but he states he stays busy with some yard work as well as working on a vehicle Pain Inventory Average Pain 4 Pain Right Now 4 My pain is sharp, stabbing, and aching  In the last 24 hours, has pain interfered with the following? General activity 10 Relation with others 5 Enjoyment of life 5 What TIME of day is your pain at its worst? morning  and night Sleep (in general) Poor  Pain is worse with: walking, bending, sitting, standing, and some activites Pain improves with: rest Relief from Meds: 0  ability to climb steps?  yes do you drive?  yes  not employed: date last employed 2018  bowel control problems weakness dizziness  Any changes since last visit?  no  Any changes since last visit?  no    Family History  Problem Relation Age of Onset   Alzheimer's disease Father    Stroke Mother    Cancer Mother        liver & breast    Breast cancer  Mother    Liver cancer Mother    Rheum arthritis Brother    Rheum arthritis Brother    Rheum arthritis Sister    Rheum arthritis Sister    Breast cancer Sister    Rheum arthritis Sister    Breast cancer Sister    Colon cancer Neg Hx    Esophageal cancer Neg Hx    Stomach cancer Neg Hx    Rectal cancer Neg Hx    Colon polyps Neg Hx    Social History   Socioeconomic History   Marital status: Married    Spouse name: Not on file   Number of children: 3   Years of education: Not on file   Highest education level: Not on file  Occupational History   Occupation: Press photographer: [employer]    Comment: retired  Tobacco Use   Smoking status: Former    Current packs/day: 0.00    Types: Cigarettes    Quit date: 07/15/1990    Years since quitting: 32.9    Passive exposure: Never   Smokeless tobacco: Former    Types: Engineer, drilling   Vaping status: Never Used  Substance and Sexual Activity   Alcohol use: Yes    Comment: social   Drug use: No   Sexual activity: Not on file  Other Topics Concern   Not on file  Social History Narrative   1 caffeine  drink daily       2 sons and a step daughter   Social Drivers of Corporate investment banker Strain: Not on file  Food Insecurity: Not on file  Transportation Needs: Not on file  Physical Activity: Not on file  Stress: Not on file  Social Connections: Not on file   Past Surgical History:  Procedure Laterality Date   CHOLECYSTECTOMY     COLONOSCOPY     KNEE SURGERY     Bilateral    POLYPECTOMY     SHOULDER SURGERY Bilateral    Bilateral - rotator cuff repair    TOTAL KNEE ARTHROPLASTY Bilateral    Past Medical History:  Diagnosis Date   Allergy    Anal fissure    Arthritis    RA   Blood transfusion without reported diagnosis    COVID-19 06/2020   Diabetes mellitus    no meds taken 05-01-16- elevated glucose was from predisone for UC   Diarrhea    Fatty liver    GERD (gastroesophageal reflux  disease)    Gout    Hiatal hernia    Hypertension    Internal hemorrhoids    Polyarthritis    PONV (postoperative nausea and vomiting)    Sessile rectal polyp 03/27/2011   Sinusitis    Tuberculosis    positive PPD- had an allergic reaction to the PPD, gets yearly chest xray   Tubular adenoma of colon    Ulcerative colitis 01/24/2011   Ht 5\' 11"  (1.803 m)   Wt 222 lb (100.7 kg)   BMI 30.96 kg/m   Opioid Risk Score:   Fall Risk Score:  `1  Depression screen [facility] 2/9     07/03/2023   11:01 AM 05/22/2023    9:01 AM  Depression screen [facility] 2/9  Decreased Interest 0 0  Down, Depressed, Hopeless 0 0  PHQ - 2 Score 0 0      Review of Systems  Musculoskeletal:  Positive for back pain and neck pain.       B/L shoulder, feet pain Left hand pain  All other systems reviewed and are negative.     Objective:   Physical Exam Vitals and nursing note reviewed.  HENT:     Head: Normocephalic and atraumatic.  Eyes:     Extraocular Movements: Extraocular movements intact.     Conjunctiva/sclera: Conjunctivae normal.     Pupils: Pupils are equal, round, and reactive to light.  Pulmonary:     Effort: Pulmonary effort is normal.  Musculoskeletal:     Comments:  Sacral thrust (prone) : Positive on right Lateral compression: Negative FABER's: Negative Distraction (supine): Negative Thigh thrust test: Negative  Skin:    General: Skin is warm and dry.  Neurological:     Mental Status: He is alert and oriented to person, place, and time.  Psychiatric:        Mood and Affect: Mood normal.        Behavior: Behavior normal.   Negative straight leg raising bilaterally Lumbar range of motion reduced 50% flexion extension lateral rotation and bending pain with extension greater than with flexion pain with lateral bending to the right and to the left.         Assessment & Plan:   1.  Chronic low back pain pain generator most likely lumbar facet joints on the right side.  Pain is  with extension greater than with flexion, SI provocative tests are negative.  Will send for aquatic  therapy. Schedule for facet medial branch blocks diagnostic Hold off on axial imaging for now. Trial of duloxetine 20 mg a day which may help with his more widespread body pain

## 2023-07-17 ENCOUNTER — Ambulatory Visit (INDEPENDENT_AMBULATORY_CARE_PROVIDER_SITE_OTHER): Payer: Medicare PPO

## 2023-07-17 VITALS — BP 146/73 | HR 61 | Temp 97.8°F | Resp 16 | Ht 71.0 in | Wt 220.2 lb

## 2023-07-17 DIAGNOSIS — K51011 Ulcerative (chronic) pancolitis with rectal bleeding: Secondary | ICD-10-CM

## 2023-07-17 DIAGNOSIS — K5100 Ulcerative (chronic) pancolitis without complications: Secondary | ICD-10-CM

## 2023-07-17 DIAGNOSIS — M1990 Unspecified osteoarthritis, unspecified site: Secondary | ICD-10-CM

## 2023-07-17 MED ORDER — SODIUM CHLORIDE 0.9 % IV SOLN
300.0000 mg | Freq: Once | INTRAVENOUS | Status: AC
  Administered 2023-07-17: 300 mg via INTRAVENOUS
  Filled 2023-07-17: qty 5

## 2023-07-17 NOTE — Progress Notes (Signed)
 Diagnosis: Ulcerative Colitis  Provider:  Mannam, Praveen MD  Procedure: IV Infusion  IV Type: Peripheral, IV Location: L Forearm  Entyvio  (Vedolizumab ), Dose: 300 mg  Infusion Start Time: 0850  Infusion Stop Time: 0928  Post Infusion IV Care: Peripheral IV Discontinued  Discharge: Condition: Good, Destination: Home . AVS Declined  Performed by:  Rocky FORBES Sar, RN

## 2023-07-22 ENCOUNTER — Other Ambulatory Visit: Payer: Self-pay | Admitting: Rheumatology

## 2023-07-22 NOTE — Telephone Encounter (Signed)
 Last Fill: 03/13/2023  Labs: 06/16/2023 CBC WNL CMP WNL Uric acid WNL  Next Visit: 11/12/2023  Last Visit: 06/16/2023  DX: Idiopathic chronic gout of multiple sites without tophus   Current Dose per office note 06/16/2023: allopurinol  300 mg 1 tablet by mouth daily   Okay to refill Allopurinol ?

## 2023-08-01 ENCOUNTER — Other Ambulatory Visit: Payer: Self-pay | Admitting: Physical Medicine & Rehabilitation

## 2023-08-04 ENCOUNTER — Other Ambulatory Visit: Payer: Self-pay

## 2023-08-04 ENCOUNTER — Telehealth: Payer: Self-pay

## 2023-08-04 ENCOUNTER — Ambulatory Visit (HOSPITAL_BASED_OUTPATIENT_CLINIC_OR_DEPARTMENT_OTHER): Payer: Medicare PPO | Attending: Physical Medicine & Rehabilitation | Admitting: Physical Therapy

## 2023-08-04 ENCOUNTER — Encounter (HOSPITAL_BASED_OUTPATIENT_CLINIC_OR_DEPARTMENT_OTHER): Payer: Self-pay | Admitting: Physical Therapy

## 2023-08-04 ENCOUNTER — Encounter: Payer: Self-pay | Admitting: Internal Medicine

## 2023-08-04 DIAGNOSIS — R531 Weakness: Secondary | ICD-10-CM | POA: Insufficient documentation

## 2023-08-04 DIAGNOSIS — M25511 Pain in right shoulder: Secondary | ICD-10-CM | POA: Insufficient documentation

## 2023-08-04 DIAGNOSIS — G8929 Other chronic pain: Secondary | ICD-10-CM | POA: Diagnosis not present

## 2023-08-04 DIAGNOSIS — M47817 Spondylosis without myelopathy or radiculopathy, lumbosacral region: Secondary | ICD-10-CM | POA: Insufficient documentation

## 2023-08-04 DIAGNOSIS — M25512 Pain in left shoulder: Secondary | ICD-10-CM | POA: Insufficient documentation

## 2023-08-04 DIAGNOSIS — R2689 Other abnormalities of gait and mobility: Secondary | ICD-10-CM | POA: Diagnosis not present

## 2023-08-04 DIAGNOSIS — M5459 Other low back pain: Secondary | ICD-10-CM | POA: Diagnosis not present

## 2023-08-04 DIAGNOSIS — M533 Sacrococcygeal disorders, not elsewhere classified: Secondary | ICD-10-CM | POA: Diagnosis not present

## 2023-08-04 NOTE — Telephone Encounter (Signed)
Christopher Smith has requested an increase in the Duloxetine. He had Physical Therapy today was told to ask for the increase. The  current dose is not giving him as much relief, as before. Patient stated he only gets a 50 percentage relief with the current dose.

## 2023-08-04 NOTE — Therapy (Signed)
OUTPATIENT PHYSICAL THERAPY THORACOLUMBAR EVALUATION   Patient Name: Christopher Smith MRN: [id number] DOB:1951/04/07, 73 y.o., male Today's Date: 08/04/2023  END OF SESSION:  PT End of Session - 08/04/23 1029     Visit Number 1    Number of Visits 16    Date for PT Re-Evaluation 10/03/23    Authorization Type humanan [employer]    PT Start Time 0815    PT Stop Time 0900    PT Time Calculation (min) 45 min    Activity Tolerance Patient tolerated treatment well    Behavior During Therapy WFL for tasks assessed/performed             Past Medical History:  Diagnosis Date   Allergy    Anal fissure    Arthritis    RA   Blood transfusion without reported diagnosis    COVID-19 06/2020   Diabetes mellitus    no meds taken 05-01-16- elevated glucose was from predisone for UC   Diarrhea    Fatty liver    GERD (gastroesophageal reflux disease)    Gout    Hiatal hernia    Hypertension    Internal hemorrhoids    Polyarthritis    PONV (postoperative nausea and vomiting)    Sessile rectal polyp 03/27/2011   Sinusitis    Tuberculosis    positive PPD- had an allergic reaction to the PPD, gets yearly chest xray   Tubular adenoma of colon    Ulcerative colitis 01/24/2011   Past Surgical History:  Procedure Laterality Date   CHOLECYSTECTOMY     COLONOSCOPY     KNEE SURGERY     Bilateral    POLYPECTOMY     SHOULDER SURGERY Bilateral    Bilateral - rotator cuff repair    TOTAL KNEE ARTHROPLASTY Bilateral    Patient Active Problem List   Diagnosis Date Noted   Open fracture of one or more phalanges of hand 11/21/2017   Chronic inflammatory arthritis 08/05/2016   High risk medication use 08/05/2016   Contracture of elbow, right 08/05/2016   History of total knee replacement, bilateral 08/05/2016   Idiopathic chronic gout 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 tophus 08/05/2016   History of bilateral rotator cuff tear repair 08/05/2016   Gastroesophageal reflux  08/05/2016   Latent tuberculosis by  blood test 08/05/2016   Ulcerative colitis (HCC) 09/03/2011   Glucose intolerance (impaired glucose tolerance) 09/03/2011   Severe diarrhea 09/03/2011   URI (upper respiratory infection) 07/02/2011   Bile salt-induced diarrhea 07/02/2011   S/P cholecystectomy 07/02/2011   Ulcerative colitis (HCC) 04/03/2011   Benign neoplasm of colon 04/03/2011   Positive TB test 03/22/2011   Insomnia due to medical condition 03/22/2011   UC (ulcerative colitis) (HCC) 03/01/2011   Leg cramps 03/01/2011   Ulcerative colitis with rectal bleeding (HCC) 01/24/2011   Nonspecific abnormal finding in stool contents 01/24/2011   Heme positive stool 01/11/2011   Diarrhea 01/11/2011   Arthritis associated with inflammatory bowel disease 01/11/2011   Intentional weight loss 01/11/2011    PCP: Irena Reichmann MD  REFERRING PROVIDER: Erick Colace, MD   REFERRING DIAG:  M53.3 (ICD-10-CM) - Sacroiliac joint disease  M47.817 (ICD-10-CM) - Lumbosacral spondylosis without myelopathy    Rationale for Evaluation and Treatment: Rehabilitation  THERAPY DIAG:  Other low back pain - Plan: PT plan of care cert/re-cert  General weakness - Plan: PT plan of care cert/re-cert  Other abnormalities of gait and mobility - Plan: PT plan of care cert/re-cert  Chronic pain  of both shoulders - Plan: PT plan of care cert/re-cert  ONSET DATE: >61yr  SUBJECTIVE:                                                                                                                                                                                           SUBJECTIVE STATEMENT: I have had bilat shoulder surgeries and TKEs. (X 18 yrs).  I am a busy guy, maybe my lifestyle may be what's causing some of my pain.  Pain everywhere.  I am on a new med that has helped a lot reduced pain intitally by 75 % now seems to be ~ 50%.  I've had falls in the past. Pain areas (shoulders, hands, feet, LB). Interrupts sleep.  Better since the new  med.  PERTINENT HISTORY:  Chronic low back pain pain generator most likely lumbar facet joints on the right side. Pain is with extension greater than with flexion   PAIN:  Are you having pain? Yes: NPRS scale: current 4/10; worst 10/10; average;  Pain location: generally posterior core, left shoulder Pain description: ache Aggravating factors: cold Relieving factors: sitting  PRECAUTIONS: None  RED FLAGS: None   WEIGHT BEARING RESTRICTIONS: No  FALLS:  Has patient fallen in last 6 months? No  LIVING ENVIRONMENT: Lives with: lives with their spouse Lives in: House/apartment Stairs: No Has following equipment at home: none   OCCUPATION: retired Advice worker  PLOF: Independent  PATIENT GOALS: build strength, less pain, better balance, get my pants on easier  NEXT MD VISIT: next week  OBJECTIVE:  Note: Objective measures were completed at Evaluation unless otherwise noted.  DIAGNOSTIC FINDINGS:  X-rays of the lumbar spine were consistent with degenerative disc disease and facet joint arthropathy.  X-rays of the pelvis were unremarkable   PATIENT SURVEYS:  FOTO Risk adjusted 43%; Primary measure 61% with goal of 61%  COGNITION: Overall cognitive status: Within functional limits for tasks assessed     SENSATION: WFL  MUSCLE LENGTH: Hamstrings: Ms shortened   POSTURE:  slight rounding of shoulders and forward head  PALPATION: Ms tightness throughout spine and post shoulder areas.  No tenderness   Shoulder ROM: 25 % limited with internal and external should rotation Shoulder strength: shoulder flex and abd 4/5   LUMBAR ROM:   AROM eval  Flexion FT to mid  calf P!  Extension Full P!  Right lateral flexion 50% limited P!  Left lateral flexion 50% limited P!  Right rotation   Left rotation    (Blank rows = not tested)  LOWER EXTREMITY strength:     Strength in lbs Right eval Left eval  Hip flexion 49.8 45.4  Hip extension    Hip abduction     Hip adduction 28.1 32.9  Hip internal rotation    Hip external rotation    Knee flexion    Knee extension 57.5 52.7  Ankle dorsiflexion    Ankle plantarflexion    Ankle inversion    Ankle eversion     (Blank rows = not tested)  LOWER EXTREMITY ROM:    Some limitation in hip ext and in/ex rotation due to muscle tightness  LUMBAR SPECIAL TESTS:  Slump test: Negative and SI Compression/distraction test: Negative  FUNCTIONAL TESTS:  5 times sit to stand: 19.27 Timed up and go (TUG): 10.55 4 stage balance: passed 1&2.  Tandem stance 10s; SLS 7s  GAIT: Distance walked: 500 Assistive device utilized: None Level of assistance: Complete Independence Comments: Guarded UE, decreased arm swing  TREATMENT  Eval                                                                                                                               PATIENT EDUCATION:  Education details: Discussed eval findings, rehab rationale, aquatic program progression/POC and pools in area. Patient is in agreement  Person educated: Patient Education method: Explanation Education comprehension: verbalized understanding  HOME EXERCISE PROGRAM: Aquatic and land tba  ASSESSMENT:  CLINICAL IMPRESSION: Patient is a 73 y.o. m who was seen today for physical therapy evaluation and treatment for LBP.  He reports generalized pain due to OA in most joints  for past 20 yrs.  Has had declined pain management up until this point as he was not interested in narcotics. Pt has begun using low does of Duloxetine for the past month and reports significant decrease in overall pain including his back (50-75%). He is an active senior and wants to remain capable of doing the things he likes to do. Functional and objective testing demonstrates decrease in hip and core strength, posterior core tightness and pain impeding movement all which limit his ADL's and mobility.  He will benefti from skilled PT both land based and aquatics to  improve all areas of deficits  OBJECTIVE IMPAIRMENTS: decreased activity tolerance, decreased balance, decreased mobility, difficulty walking, decreased strength, impaired flexibility, postural dysfunction, and pain.   ACTIVITY LIMITATIONS: bending, sitting, squatting, stairs, transfers, reach over head, and locomotion level  PARTICIPATION LIMITATIONS: community activity and yard work  PERSONAL FACTORS: Time since onset of injury/illness/exacerbation and 1-2 comorbidities: see PmHx  are also affecting patient's functional outcome.   REHAB POTENTIAL: Good  CLINICAL DECISION MAKING: Evolving/moderate complexity  EVALUATION COMPLEXITY: High   GOALS: Goals reviewed with patient? Yes  SHORT TERM GOALS: Target date: 08/25/23  Pt will tolerate full aquatic sessions consistently without increase in pain and with improving function to demonstrate good toleration and effectiveness of intervention.  Baseline: Goal status: INITIAL  2.  Pt will improve on 5 X STS test to <or= 17s (MCID) to demonstrate improving functional  lower extremity strength, transitional movements, and balance Baseline: 19.27 Goal status: INITIAL  3.  Pt will complete SLS in 3.6 ft consistently @20s  or > Baseline: unable land based Goal status: INITIAL  4.  Pt to report reduction in pain with aquatic intervention at <2/10 while submerged Baseline:  Goal status: INITIAL  5.  Pt will have improve on Lumbar Side bending ROM by 50%. Baseline: see chart Goal status: INITIAL    LONG TERM GOALS: Target date: March 21/25  Pt to meet stated Foto Goal Baseline:  Goal status: INITIAL  2.  Pt will be indep with final HEP's (land and aquatic as appropriate) for continued management of condition Baseline:  Goal status: INITIAL  3.  Pt will improve strength in hips and shoulders by at least 10 lbs to demonstrate improved overall physical function Baseline: see chart.  Shoulder strength in lbs TBA Goal status:  INITIAL  4.  Pt will complete tandem and SLS x 20s to demonstrate improvement in balance ability. Baseline:  Goal status: INITIAL  5.  Pt will report "normal" pain to be 2 or less/10 for improve mobility Baseline: 4 Goal status: INITIAL  6.  Pt will report donning pants without difficulty Baseline: difficult due to muscle tightness in LE Goal status: INITIAL  PLAN:  PT FREQUENCY: 2x/week  PT DURATION: 8 weeks  PLANNED INTERVENTIONS: 97164- PT Re-evaluation, 97110-Therapeutic exercises, 97530- Therapeutic activity, 97112- Neuromuscular re-education, 97535- Self Care, 16109- Manual therapy, [id number]- Gait training, [phone]- Aquatic Therapy, 97014- Electrical stimulation (unattended), [phone]- Ionotophoresis 4mg /ml Dexamethasone, Patient/Family education, Balance training, Stair training, Taping, Dry Needling, Joint mobilization, DME instructions, Cryotherapy, and Moist heat.  PLAN FOR NEXT SESSION: Aquatics for core and LE stretching, pain reduction/management, balance retraining Land: UE/shoulder ROM and strengthening; LB and core strengthening; hip strengthening   Rushie Chestnut) Nixie Laube MPT 08/04/23 11:12 AM [facility] [address] [place], [place], [address] Phone: [phone]   Fax:  [phone]   Referring diagnosis? LBP Treatment diagnosis? (if different than referring diagnosis) LBP AND shoulder pain What was this (referring dx) caused by? []  Surgery []  Fall [x]  Ongoing issue [x]  Arthritis []  Other: ____________  Laterality: []  Rt []  Lt [x]  Both  Check all possible CPT codes:  *CHOOSE 10 OR LESS*    See Planned Interventions listed in the Plan section of the Evaluation.

## 2023-08-12 ENCOUNTER — Encounter: Payer: Medicare PPO | Attending: Physical Medicine & Rehabilitation | Admitting: Physical Medicine & Rehabilitation

## 2023-08-12 ENCOUNTER — Encounter: Payer: Self-pay | Admitting: Physical Medicine & Rehabilitation

## 2023-08-12 VITALS — BP 132/78 | HR 78 | Ht 71.0 in | Wt 219.8 lb

## 2023-08-12 DIAGNOSIS — M47817 Spondylosis without myelopathy or radiculopathy, lumbosacral region: Secondary | ICD-10-CM | POA: Insufficient documentation

## 2023-08-12 MED ORDER — LIDOCAINE HCL 1 % IJ SOLN
5.0000 mL | Freq: Once | INTRAMUSCULAR | Status: AC
  Administered 2023-08-12: 5 mL

## 2023-08-12 MED ORDER — IOHEXOL 180 MG/ML  SOLN
2.0000 mL | Freq: Once | INTRAMUSCULAR | Status: AC
  Administered 2023-08-12: 2 mL

## 2023-08-12 MED ORDER — LIDOCAINE HCL (PF) 2 % IJ SOLN
2.0000 mL | Freq: Once | INTRAMUSCULAR | Status: AC
  Administered 2023-08-12: 2 mL

## 2023-08-12 NOTE — Progress Notes (Signed)
  PROCEDURE RECORD  Physical Medicine and Rehabilitation   Name: Christopher Smith DOB:06-04-1951 MRN: [id number]  Date:08/12/2023  Physician: Claudette Laws, MD    Nurse/CMA: Oluwatosin Bracy RN  Allergies: No Known Allergies  Consent Signed: Yes.    Is patient diabetic? No.  CBG today?   Pregnant: No. LMP: No LMP for male patient. (age [age])  Anticoagulants: no Anti-inflammatory: yes (meds for rheumatoid arthritis but held methotrexate for a week for this injection) Antibiotics: no  Procedure: right lumbar 3-4-5 medial branch block Position: Prone Start Time: 1:04  End Time: 1:12 Fluoro Time: 38 sec   RN/CMA Haematologist RN    Time 1237 1:16    BP 132/78 154/85    Pulse 78 70    Respirations 16 16    O2 Sat 98 96    S/S 6 6    Pain Level 4 0     D/C home with self, patient A & O X 3, D/C instructions reviewed, and sits independently.

## 2023-08-12 NOTE — Progress Notes (Signed)
Right lumbar L3, L4 medial branch blocks and L5 dorsal ramus injection under fluoroscopic guidance  Indication: Right Lumbar pain which is not relieved by medication management or other conservative care and interfering with self-care and mobility.  Informed consent was obtained after describing risks and benefits of the procedure with the patient, this includes bleeding, bruising, infection, paralysis and medication side effects. The patient wishes to proceed and has given written consent. The patient was placed in a prone position. The lumbar area was marked and prepped with Betadine. One ML of 1% lidocaine was injected into each of 3 areas into the skin and subcutaneous tissue. Then a 22-gauge 5" spinal needle was inserted targeting the junction of the Right S1 superior articular process and sacral ala junction. Needle was advanced under fluoroscopic guidance. Bone contact was made.Isovue 200 was injected x0.5 mL demonstrating no intravascular uptake. Then a solution containing 2% MPF lidocaine was injected x0.5 mL. Then the Right L5 superior articular process in transverse process junction was targeted. Bone contact was made.Isovue 200 was injected x0.5 mL demonstrating no intravascular uptake. Then a solution containing 2% MPF lidocaine was injected x0.5 mL. Then the Right L4 superior articular process in transverse process junction was targeted. Bone contact was made. Isovue 200 was injected x0.5 mL demonstrating no intravascular uptake. Then a solution containing2% MPF lidocaine was injected x0.5 mL Patient tolerated procedure well. Post procedure instructions were given. Please refer to post procedure form.

## 2023-08-18 ENCOUNTER — Encounter (HOSPITAL_BASED_OUTPATIENT_CLINIC_OR_DEPARTMENT_OTHER): Payer: Self-pay | Admitting: Physical Therapy

## 2023-08-18 ENCOUNTER — Encounter: Payer: Medicare PPO | Admitting: Internal Medicine

## 2023-08-18 ENCOUNTER — Ambulatory Visit (HOSPITAL_BASED_OUTPATIENT_CLINIC_OR_DEPARTMENT_OTHER): Payer: Medicare PPO | Attending: Physical Medicine & Rehabilitation | Admitting: Physical Therapy

## 2023-08-18 DIAGNOSIS — M25511 Pain in right shoulder: Secondary | ICD-10-CM | POA: Insufficient documentation

## 2023-08-18 DIAGNOSIS — M5459 Other low back pain: Secondary | ICD-10-CM | POA: Diagnosis not present

## 2023-08-18 DIAGNOSIS — G8929 Other chronic pain: Secondary | ICD-10-CM | POA: Diagnosis not present

## 2023-08-18 DIAGNOSIS — R2689 Other abnormalities of gait and mobility: Secondary | ICD-10-CM | POA: Diagnosis not present

## 2023-08-18 DIAGNOSIS — M25512 Pain in left shoulder: Secondary | ICD-10-CM | POA: Insufficient documentation

## 2023-08-18 DIAGNOSIS — R531 Weakness: Secondary | ICD-10-CM | POA: Insufficient documentation

## 2023-08-18 NOTE — Therapy (Signed)
OUTPATIENT PHYSICAL THERAPY THORACOLUMBAR EVALUATION   Patient Name: Christopher Smith MRN: [id number] DOB:Mar 06, 1951, 73 y.o., male Today's Date: 08/18/2023  END OF SESSION:  PT End of Session - 08/18/23 0915     Visit Number 2    Number of Visits 16    Date for PT Re-Evaluation 10/03/23    Authorization Type [employer]    Authorization Time Period 16 visits approved From 01.20.2025 - 03.21.2025 Authorization #[id number]    Authorization - Visit Number 2    Authorization - Number of Visits 16    Progress Note Due on Visit 10    PT Start Time 0846    PT Stop Time 0925    PT Time Calculation (min) 39 min    Activity Tolerance Patient tolerated treatment well    Behavior During Therapy [facility] for tasks assessed/performed              Past Medical History:  Diagnosis Date   Allergy    Anal fissure    Arthritis    RA   Blood transfusion without reported diagnosis    COVID-19 06/2020   Diabetes mellitus    no meds taken 05-01-16- elevated glucose was from predisone for UC   Diarrhea    Fatty liver    GERD (gastroesophageal reflux disease)    Gout    Hiatal hernia    Hypertension    Internal hemorrhoids    Polyarthritis    PONV (postoperative nausea and vomiting)    Sessile rectal polyp 03/27/2011   Sinusitis    Tuberculosis    positive PPD- had an allergic reaction to the PPD, gets yearly chest xray   Tubular adenoma of colon    Ulcerative colitis 01/24/2011   Past Surgical History:  Procedure Laterality Date   CHOLECYSTECTOMY     COLONOSCOPY     KNEE SURGERY     Bilateral    POLYPECTOMY     SHOULDER SURGERY Bilateral    Bilateral - rotator cuff repair    TOTAL KNEE ARTHROPLASTY Bilateral    Patient Active Problem List   Diagnosis Date Noted   Open fracture of one or more phalanges of hand 11/21/2017   Chronic inflammatory arthritis 08/05/2016   High risk medication use 08/05/2016   Contracture of elbow, right 08/05/2016   History of total knee  replacement, bilateral 08/05/2016   Idiopathic chronic gout 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 tophus 08/05/2016   History of bilateral rotator cuff tear repair 08/05/2016   Gastroesophageal reflux  08/05/2016   Latent tuberculosis by blood test 08/05/2016   Ulcerative colitis (HCC) 09/03/2011   Glucose intolerance (impaired glucose tolerance) 09/03/2011   Severe diarrhea 09/03/2011   URI (upper respiratory infection) 07/02/2011   Bile salt-induced diarrhea 07/02/2011   S/P cholecystectomy 07/02/2011   Ulcerative colitis (HCC) 04/03/2011   Benign neoplasm of colon 04/03/2011   Positive TB test 03/22/2011   Insomnia due to medical condition 03/22/2011   UC (ulcerative colitis) (HCC) 03/01/2011   Leg cramps 03/01/2011   Ulcerative colitis with rectal bleeding (HCC) 01/24/2011   Nonspecific abnormal finding in stool contents 01/24/2011   Heme positive stool 01/11/2011   Diarrhea 01/11/2011   Arthritis associated with inflammatory bowel disease 01/11/2011   Intentional weight loss 01/11/2011    PCP: Irena Reichmann MD  REFERRING PROVIDER: Erick Colace, MD   REFERRING DIAG:  M53.3 (ICD-10-CM) - Sacroiliac joint disease  M47.817 (ICD-10-CM) - Lumbosacral spondylosis without myelopathy    Rationale for Evaluation and Treatment: Rehabilitation  THERAPY DIAG:  Other low back pain  General weakness  Other abnormalities of gait and mobility  ONSET DATE: >20yr  SUBJECTIVE:                                                                                                                                                                                           SUBJECTIVE STATEMENT: Had the injection last week and my pain is way down.  Go back in 4 weeks for another injection.  Initial subjective I have had bilat shoulder surgeries and TKEs. (X 18 yrs).  I am a busy guy, maybe my lifestyle may be what's causing some of my pain.  Pain everywhere.  I am on a new med that has helped a lot reduced pain  intitally by 75 % now seems to be ~ 50%.  I've had falls in the past. Pain areas (shoulders, hands, feet, LB). Interrupts sleep.  Better since the new med.  PERTINENT HISTORY:  Chronic low back pain pain generator most likely lumbar facet joints on the right side. Pain is with extension greater than with flexion   PAIN:  Are you having pain? Yes: NPRS scale: current 1/10; worst 10/10; average;  Pain location: generally posterior core, left shoulder Pain description: ache Aggravating factors: cold Relieving factors: sitting  PRECAUTIONS: None  RED FLAGS: None   WEIGHT BEARING RESTRICTIONS: No  FALLS:  Has patient fallen in last 6 months? No  LIVING ENVIRONMENT: Lives with: lives with their spouse Lives in: House/apartment Stairs: No Has following equipment at home: none   OCCUPATION: retired Advice worker  PLOF: Independent  PATIENT GOALS: build strength, less pain, better balance, get my pants on easier  NEXT MD VISIT: next week  OBJECTIVE:  Note: Objective measures were completed at Evaluation unless otherwise noted.  DIAGNOSTIC FINDINGS:  X-rays of the lumbar spine were consistent with degenerative disc disease and facet joint arthropathy.  X-rays of the pelvis were unremarkable   PATIENT SURVEYS:  FOTO Risk adjusted 43%; Primary measure 61% with goal of 61%  COGNITION: Overall cognitive status: Within functional limits for tasks assessed     SENSATION: WFL  MUSCLE LENGTH: Hamstrings: Ms shortened   POSTURE:  slight rounding of shoulders and forward head  PALPATION: Ms tightness throughout spine and post shoulder areas.  No tenderness   Shoulder ROM: 25 % limited with internal and external should rotation Shoulder strength: shoulder flex and abd 4/5   LUMBAR ROM:   AROM eval  Flexion FT to mid  calf P!  Extension Full P!  Right lateral flexion 50% limited P!  Left lateral flexion 50% limited P!  Right rotation   Left rotation    (Blank  rows = not tested)  LOWER EXTREMITY strength:     Strength in lbs Right eval Left eval  Hip flexion 49.8 45.4  Hip extension    Hip abduction    Hip adduction 28.1 32.9  Hip internal rotation    Hip external rotation    Knee flexion    Knee extension 57.5 52.7  Ankle dorsiflexion    Ankle plantarflexion    Ankle inversion    Ankle eversion     (Blank rows = not tested)  LOWER EXTREMITY ROM:    Some limitation in hip ext and in/ex rotation due to muscle tightness  LUMBAR SPECIAL TESTS:  Slump test: Negative and SI Compression/distraction test: Negative  FUNCTIONAL TESTS:  5 times sit to stand: 19.27 Timed up and go (TUG): 10.55 4 stage balance: passed 1&2.  Tandem stance 10s; SLS 7s  GAIT: Distance walked: 500 Assistive device utilized: None Level of assistance: Complete Independence Comments: Guarded UE, decreased arm swing  TREATMENT  Pt seen for aquatic therapy today.  Treatment took place in water 3.5-4.75 ft in depth at the [facility] pool. Temp of water was 91.  Pt entered/exited the pool via stairs using alternating pattern with  hand rail.  *walking 4.2 ft forward and backward.  Instruction on abd bracing for core engagement *side stepping with engaged core *Farmers carry with yellow HB *L stretch x 3 VC, demonstration and tactile cues from deck *3 way hamstring stretch *standing ue horizontal add/abd; row *Bow and arrow (demonstration)  Pt requires the buoyancy and hydrostatic pressure of water for support, and to offload joints by unweighting joint load by at least 50 % in navel deep water and by at least 75-80% in chest to neck deep water.  Viscosity of the water is needed for resistance of strengthening. Water current perturbations provides challenge to standing balance requiring increased core activation.                                                                                                                                PATIENT  EDUCATION:  Education details: Discussed eval findings, rehab rationale, aquatic program progression/POC and pools in area. Patient is in agreement  Person educated: Patient Education method: Explanation Education comprehension: verbalized understanding  HOME EXERCISE PROGRAM: Aquatic and land tba  ASSESSMENT:  CLINICAL IMPRESSION: Pt demonstrates safety and indep in setting with therapist instructing from deck.  Instruction on core stabilization and engagement throughout session for improved posture and strengthening.  He is directed through movement patterns, positioning and stretching to decrease pain sensitivity and improved mobility.  He requires vc and demonstration for most exercises for proper execution. Good 1st session with no reports of pain.  Posterior core stretching to continue to be worked on.  He is a good candidate for aquatic therapy intervention and will benefit from the properties of water to progress towards  land based goals.   Initial Impression Patient is a 73 y.o. m who was seen today for physical therapy evaluation and treatment for LBP.  He reports generalized pain due to OA in most joints  for past 20 yrs.  Has had declined pain management up until this point as he was not interested in narcotics. Pt has begun using low does of Duloxetine for the past month and reports significant decrease in overall pain including his back (50-75%). He is an active senior and wants to remain capable of doing the things he likes to do. Functional and objective testing demonstrates decrease in hip and core strength, posterior core tightness and pain impeding movement all which limit his ADL's and mobility.  He will benefti from skilled PT both land based and aquatics to improve all areas of deficits  OBJECTIVE IMPAIRMENTS: decreased activity tolerance, decreased balance, decreased mobility, difficulty walking, decreased strength, impaired flexibility, postural dysfunction, and pain.    ACTIVITY LIMITATIONS: bending, sitting, squatting, stairs, transfers, reach over head, and locomotion level  PARTICIPATION LIMITATIONS: community activity and yard work  PERSONAL FACTORS: Time since onset of injury/illness/exacerbation and 1-2 comorbidities: see PmHx  are also affecting patient's functional outcome.   REHAB POTENTIAL: Good  CLINICAL DECISION MAKING: Evolving/moderate complexity  EVALUATION COMPLEXITY: High   GOALS: Goals reviewed with patient? Yes  SHORT TERM GOALS: Target date: 08/25/23  Pt will tolerate full aquatic sessions consistently without increase in pain and with improving function to demonstrate good toleration and effectiveness of intervention.  Baseline: Goal status: INITIAL  2.  Pt will improve on 5 X STS test to <or= 17s (MCID) to demonstrate improving functional lower extremity strength, transitional movements, and balance Baseline: 19.27 Goal status: INITIAL  3.  Pt will complete SLS in 3.6 ft consistently @20s  or > Baseline: unable land based Goal status: INITIAL  4.  Pt to report reduction in pain with aquatic intervention at <2/10 while submerged Baseline:  Goal status: INITIAL  5.  Pt will have improve on Lumbar Side bending ROM by 50%. Baseline: see chart Goal status: INITIAL    LONG TERM GOALS: Target date: March 21/25  Pt to meet stated Foto Goal Baseline:  Goal status: INITIAL  2.  Pt will be indep with final HEP's (land and aquatic as appropriate) for continued management of condition Baseline:  Goal status: INITIAL  3.  Pt will improve strength in hips and shoulders by at least 10 lbs to demonstrate improved overall physical function Baseline: see chart.  Shoulder strength in lbs TBA Goal status: INITIAL  4.  Pt will complete tandem and SLS x 20s to demonstrate improvement in balance ability. Baseline:  Goal status: INITIAL  5.  Pt will report "normal" pain to be 2 or less/10 for improve mobility Baseline:  4 Goal status: INITIAL  6.  Pt will report donning pants without difficulty Baseline: difficult due to muscle tightness in LE Goal status: INITIAL  PLAN:  PT FREQUENCY: 2x/week  PT DURATION: 8 weeks  PLANNED INTERVENTIONS: 97164- PT Re-evaluation, 97110-Therapeutic exercises, 97530- Therapeutic activity, 97112- Neuromuscular re-education, 97535- Self Care, 40981- Manual therapy, [id number]- Gait training, [id number]- Aquatic Therapy, 97014- Electrical stimulation (unattended), [phone]- Ionotophoresis 4mg /ml Dexamethasone, Patient/Family education, Balance training, Stair training, Taping, Dry Needling, Joint mobilization, DME instructions, Cryotherapy, and Moist heat.  PLAN FOR NEXT SESSION: Aquatics for core and LE stretching, pain reduction/management, balance retraining Land: UE/shoulder ROM and strengthening; LB and core strengthening; hip strengthening   Corrie Dandy Tomma Lightning) Zaidin Blyden MPT 08/18/23 12:37  PM [facility] [address] [place], [place], [address] Phone: [phone]   Fax:  [phone]   Referring diagnosis? LBP Treatment diagnosis? (if different than referring diagnosis) LBP AND shoulder pain What was this (referring dx) caused by? []  Surgery []  Fall [x]  Ongoing issue [x]  Arthritis []  Other: ____________  Laterality: []  Rt []  Lt [x]  Both  Check all possible CPT codes:  *CHOOSE 10 OR LESS*    See Planned Interventions listed in the Plan section of the Evaluation.

## 2023-08-20 ENCOUNTER — Encounter (HOSPITAL_BASED_OUTPATIENT_CLINIC_OR_DEPARTMENT_OTHER): Payer: Self-pay | Admitting: Physical Therapy

## 2023-08-20 ENCOUNTER — Ambulatory Visit (HOSPITAL_BASED_OUTPATIENT_CLINIC_OR_DEPARTMENT_OTHER): Payer: Medicare PPO | Admitting: Physical Therapy

## 2023-08-20 DIAGNOSIS — R531 Weakness: Secondary | ICD-10-CM | POA: Diagnosis not present

## 2023-08-20 DIAGNOSIS — M25511 Pain in right shoulder: Secondary | ICD-10-CM | POA: Diagnosis not present

## 2023-08-20 DIAGNOSIS — R2689 Other abnormalities of gait and mobility: Secondary | ICD-10-CM | POA: Diagnosis not present

## 2023-08-20 DIAGNOSIS — G8929 Other chronic pain: Secondary | ICD-10-CM | POA: Diagnosis not present

## 2023-08-20 DIAGNOSIS — M5459 Other low back pain: Secondary | ICD-10-CM

## 2023-08-20 DIAGNOSIS — M25512 Pain in left shoulder: Secondary | ICD-10-CM | POA: Diagnosis not present

## 2023-08-20 NOTE — Therapy (Signed)
 OUTPATIENT PHYSICAL THERAPY THORACOLUMBAR TREATMENT   Patient Name: Christopher Smith MRN: [id number] DOB:07-30-1950, 73 y.o., male Today's Date: 08/20/2023  END OF SESSION:  PT End of Session - 08/20/23 0846     Visit Number 3    Number of Visits 16    Date for PT Re-Evaluation 10/03/23    Authorization Type [employer]    Authorization - Visit Number 3    Authorization - Number of Visits 16    Progress Note Due on Visit 10    PT Start Time 0847    PT Stop Time 0925    PT Time Calculation (min) 38 min    Activity Tolerance Patient tolerated treatment well    Behavior During Therapy WFL for tasks assessed/performed              Past Medical History:  Diagnosis Date   Allergy     Anal fissure    Arthritis    RA   Blood transfusion without reported diagnosis    COVID-19 06/2020   Diabetes mellitus    no meds taken 05-01-16- elevated glucose was from [facility] for UC   Diarrhea    Fatty liver    GERD (gastroesophageal reflux disease)    Gout    Hiatal hernia    Hypertension    Internal hemorrhoids    Polyarthritis    PONV (postoperative nausea and vomiting)    Sessile rectal polyp 03/27/2011   Sinusitis    Tuberculosis    positive PPD- had an allergic reaction to the PPD, gets yearly chest xray   Tubular adenoma of colon    Ulcerative colitis 01/24/2011   Past Surgical History:  Procedure Laterality Date   CHOLECYSTECTOMY     COLONOSCOPY     KNEE SURGERY     Bilateral    POLYPECTOMY     SHOULDER SURGERY Bilateral    Bilateral - rotator cuff repair    TOTAL KNEE ARTHROPLASTY Bilateral    Patient Active Problem List   Diagnosis Date Noted   Open fracture of one or more phalanges of hand 11/21/2017   Chronic inflammatory arthritis 08/05/2016   High risk medication use 08/05/2016   Contracture of elbow, right 08/05/2016   History of total knee replacement, bilateral 08/05/2016   Idiopathic chronic gout 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 tophus 08/05/2016   History of  bilateral rotator cuff tear repair 08/05/2016   Gastroesophageal reflux  08/05/2016   Latent tuberculosis by blood test 08/05/2016   Ulcerative colitis (HCC) 09/03/2011   Glucose intolerance (impaired glucose tolerance) 09/03/2011   Severe diarrhea 09/03/2011   URI (upper respiratory infection) 07/02/2011   Bile salt-induced diarrhea 07/02/2011   S/P cholecystectomy 07/02/2011   Ulcerative colitis (HCC) 04/03/2011   Benign neoplasm of colon 04/03/2011   Positive TB test 03/22/2011   Insomnia due to medical condition 03/22/2011   UC (ulcerative colitis) (HCC) 03/01/2011   Leg cramps 03/01/2011   Ulcerative colitis with rectal bleeding (HCC) 01/24/2011   Nonspecific abnormal finding in stool contents 01/24/2011   Heme positive stool 01/11/2011   Diarrhea 01/11/2011   Arthritis associated with inflammatory bowel disease 01/11/2011   Intentional weight loss 01/11/2011    PCP: Lonell collet MD  REFERRING PROVIDER: Carilyn Prentice BRAVO, MD   REFERRING DIAG:  M53.3 (ICD-10-CM) - Sacroiliac joint disease  M47.817 (ICD-10-CM) - Lumbosacral spondylosis without myelopathy    Rationale for Evaluation and Treatment: Rehabilitation  THERAPY DIAG:  Other low back pain  General weakness  Other abnormalities of  gait and mobility  ONSET DATE: >53yr  SUBJECTIVE:                                                                                                                                                                                           SUBJECTIVE STATEMENT: Pt reports that he felt tired and fatigue in LEs after pool session.   He is wanting to get stronger.  He is hopeful to get his wife to go with him to the [facility].     Initial subjective I have had bilat shoulder surgeries and TKEs. (X 18 yrs).  I am a busy guy, maybe my lifestyle may be what's causing some of my pain.  Pain everywhere.  I am on a new med that has helped a lot reduced pain intitally by 75 % now seems to be ~  50%.  I've had falls in the past. Pain areas (shoulders, hands, feet, LB). Interrupts sleep.  Better since the new med.  PERTINENT HISTORY:  Chronic low back pain pain generator most likely lumbar facet joints on the right side. Pain is with extension greater than with flexion   PAIN:  Are you having pain? No : NPRS scale: current 0/10;  Pain location:  Pain description: ache Aggravating factors: cold Relieving factors: sitting  PRECAUTIONS: None  RED FLAGS: None   WEIGHT BEARING RESTRICTIONS: No  FALLS:  Has patient fallen in last 6 months? No  LIVING ENVIRONMENT: Lives with: lives with their spouse Lives in: House/apartment Stairs: No Has following equipment at home: none   OCCUPATION: retired advice worker  PLOF: Independent  PATIENT GOALS: build strength, less pain, better balance, get my pants on easier  NEXT MD VISIT: next week  OBJECTIVE:  Note: Objective measures were completed at Evaluation unless otherwise noted.  DIAGNOSTIC FINDINGS:  X-rays of the lumbar spine were consistent with degenerative disc disease and facet joint arthropathy.  X-rays of the pelvis were unremarkable   PATIENT SURVEYS:  FOTO Risk adjusted 43%; Primary measure 61% with goal of 61%  COGNITION: Overall cognitive status: Within functional limits for tasks assessed     SENSATION: WFL  MUSCLE LENGTH: Hamstrings: Ms shortened   POSTURE:  slight rounding of shoulders and forward head  PALPATION: Ms tightness throughout spine and post shoulder areas.  No tenderness   Shoulder ROM: 25 % limited with internal and external should rotation Shoulder strength: shoulder flex and abd 4/5   LUMBAR ROM:   AROM eval  Flexion FT to mid  calf P!  Extension Full P!  Right lateral flexion 50% limited P!  Left lateral flexion 50% limited P!  Right rotation   Left rotation    (Blank rows = not tested)  LOWER EXTREMITY strength:     Strength in lbs Right eval Left eval  Hip  flexion 49.8 45.4  Hip extension    Hip abduction    Hip adduction 28.1 32.9  Hip internal rotation    Hip external rotation    Knee flexion    Knee extension 57.5 52.7  Ankle dorsiflexion    Ankle plantarflexion    Ankle inversion    Ankle eversion     (Blank rows = not tested)  LOWER EXTREMITY ROM:    Some limitation in hip ext and in/ex rotation due to muscle tightness  LUMBAR SPECIAL TESTS:  Slump test: Negative and SI Compression/distraction test: Negative  FUNCTIONAL TESTS:  5 times sit to stand: 19.27 Timed up and go (TUG): 10.55 4 stage balance: passed 1&2.  Tandem stance 10s; SLS 7s  GAIT: Distance walked: 500 Assistive device utilized: None Level of assistance: Complete Independence Comments: Guarded UE, decreased arm swing  TREATMENT  NuStep: L5 UE/LE x 6 min Resisted shoulder ext in staggered stance x 10 each LE forward with TrA set  Bow and arrow with green band and same side step back x 10 each side L stretch at counter x 15s x 3 Seated hamstring stretch, 15s x 3 each LE Seated piriformis stretch x 15s x 2 each  Lower trunk rotation x 5 each side  Hooklying, TrA set with lap press x 5 sec x 10                                                                                                                      PATIENT EDUCATION:  Education details: exercise rationale, technique, modifications  - issued initial HEP Person educated: Patient Education method: Explanation Education comprehension: verbalized understanding  HOME EXERCISE PROGRAM: Access Code: [email or web address] URL: [email or web address] Date: 08/20/2023 Prepared by: [facility] [address]  Exercises - Shoulder Extension with Resistance  - 3 x weekly - 1-2 sets - 10 reps - Standing 'L' Stretch at Counter  - 2 x daily - 7 x weekly - 2 reps - 10 seconds hold - Seated Hamstring Stretch  - 2 x daily - 7 x weekly - 2-3 reps - 15-20 hold - Seated Figure 4 Piriformis  Stretch  - 2 x daily - 7 x weekly - 2 reps - 15-20 hold - Supine Lower Trunk Rotation  - 2 x daily - 7 x weekly - 3-5 reps - 5-10 hold - Supine Transversus Abdominis Bracing - Hands on Thighs  - 1 x daily - 7 x weekly - 10 reps - 5 hold  Aquatic tba  ASSESSMENT:  CLINICAL IMPRESSION: Pt's R hamstring tighter than L and L piriformis tighter than R.  Pt reported some discomfort in his R low back after completing the TrA lap press in supine.  Of note, he does present with rectus diastasis and requires cues to TrA set  with exercises.  Good tolerance of previous aquatic therapy session. Goals are ongoing. Will progress as tolerated.    Initial Impression Patient is a 73 y.o. m who was seen today for physical therapy evaluation and treatment for LBP.  He reports generalized pain due to OA in most joints  for past 20 yrs.  Has had declined pain management up until this point as he was not interested in narcotics. Pt has begun using low does of Duloxetine  for the past month and reports significant decrease in overall pain including his back (50-75%). He is an active senior and wants to remain capable of doing the things he likes to do. Functional and objective testing demonstrates decrease in hip and core strength, posterior core tightness and pain impeding movement all which limit his ADL's and mobility.  He will benefti from skilled PT both land based and aquatics to improve all areas of deficits  OBJECTIVE IMPAIRMENTS: decreased activity tolerance, decreased balance, decreased mobility, difficulty walking, decreased strength, impaired flexibility, postural dysfunction, and pain.   ACTIVITY LIMITATIONS: bending, sitting, squatting, stairs, transfers, reach over head, and locomotion level  PARTICIPATION LIMITATIONS: community activity and yard work  PERSONAL FACTORS: Time since onset of injury/illness/exacerbation and 1-2 comorbidities: see PmHx  are also affecting patient's functional outcome.    REHAB POTENTIAL: Good  CLINICAL DECISION MAKING: Evolving/moderate complexity  EVALUATION COMPLEXITY: High   GOALS: Goals reviewed with patient? Yes  SHORT TERM GOALS: Target date: 08/25/23  Pt will tolerate full aquatic sessions consistently without increase in pain and with improving function to demonstrate good toleration and effectiveness of intervention.  Baseline: Goal status: INITIAL  2.  Pt will improve on 5 X STS test to <or= 17s (MCID) to demonstrate improving functional lower extremity strength, transitional movements, and balance Baseline: 19.27 Goal status: INITIAL  3.  Pt will complete SLS in 3.6 ft consistently @20s  or > Baseline: unable land based Goal status: INITIAL  4.  Pt to report reduction in pain with aquatic intervention at <2/10 while submerged Baseline:  Goal status: INITIAL  5.  Pt will have improve on Lumbar Side bending ROM by 50%. Baseline: see chart Goal status: INITIAL    LONG TERM GOALS: Target date: March 21/25  Pt to meet stated Foto Goal Baseline:  Goal status: INITIAL  2.  Pt will be indep with final HEP's (land and aquatic as appropriate) for continued management of condition Baseline:  Goal status: INITIAL  3.  Pt will improve strength in hips and shoulders by at least 10 lbs to demonstrate improved overall physical function Baseline: see chart.  Shoulder strength in lbs TBA Goal status: INITIAL  4.  Pt will complete tandem and SLS x 20s to demonstrate improvement in balance ability. Baseline:  Goal status: INITIAL  5.  Pt will report normal pain to be 2 or less/10 for improve mobility Baseline: 4 Goal status: INITIAL  6.  Pt will report donning pants without difficulty Baseline: difficult due to muscle tightness in LE Goal status: INITIAL  PLAN:  PT FREQUENCY: 2x/week  PT DURATION: 8 weeks  PLANNED INTERVENTIONS: 97164- PT Re-evaluation, 97110-Therapeutic exercises, 97530- Therapeutic activity, 97112-  Neuromuscular re-education, 97535- Self Care, [id number]- Manual therapy, [id number]- Gait training, [phone]- Aquatic Therapy, 97014- Electrical stimulation (unattended), [phone]- Ionotophoresis 4mg /ml Dexamethasone, Patient/Family education, Balance training, Stair training, Taping, Dry Needling, Joint mobilization, DME instructions, Cryotherapy, and Moist heat.  PLAN FOR NEXT SESSION: Aquatics for core and LE stretching, pain reduction/management, balance retraining Land: UE/shoulder ROM and  strengthening; LB and core strengthening; hip strengthening   Delon Aquas, PTA 08/20/23 9:27 AM [facility] [address] [place], [place], [address] Phone: [phone]   Fax:  [phone]    Referring diagnosis? LBP Treatment diagnosis? (if different than referring diagnosis) LBP AND shoulder pain What was this (referring dx) caused by? []  Surgery []  Fall [x]  Ongoing issue [x]  Arthritis []  Other: ____________  Laterality: []  Rt []  Lt [x]  Both  Check all possible CPT codes:  *CHOOSE 10 OR LESS*    See Planned Interventions listed in the Plan section of the Evaluation.

## 2023-08-25 ENCOUNTER — Ambulatory Visit (HOSPITAL_BASED_OUTPATIENT_CLINIC_OR_DEPARTMENT_OTHER): Payer: Medicare PPO | Admitting: Physical Therapy

## 2023-08-25 ENCOUNTER — Encounter (HOSPITAL_BASED_OUTPATIENT_CLINIC_OR_DEPARTMENT_OTHER): Payer: Self-pay | Admitting: Physical Therapy

## 2023-08-25 DIAGNOSIS — M5459 Other low back pain: Secondary | ICD-10-CM | POA: Diagnosis not present

## 2023-08-25 DIAGNOSIS — R531 Weakness: Secondary | ICD-10-CM | POA: Diagnosis not present

## 2023-08-25 DIAGNOSIS — R2689 Other abnormalities of gait and mobility: Secondary | ICD-10-CM

## 2023-08-25 DIAGNOSIS — M25512 Pain in left shoulder: Secondary | ICD-10-CM | POA: Diagnosis not present

## 2023-08-25 DIAGNOSIS — M25511 Pain in right shoulder: Secondary | ICD-10-CM | POA: Diagnosis not present

## 2023-08-25 DIAGNOSIS — G8929 Other chronic pain: Secondary | ICD-10-CM

## 2023-08-25 NOTE — Therapy (Signed)
 OUTPATIENT PHYSICAL THERAPY THORACOLUMBAR TREATMENT   Patient Name: Christopher Smith MRN: [id number] DOB:1950-10-06, 73 y.o., male Today's Date: 08/25/2023  END OF SESSION:  PT End of Session - 08/25/23 0849     Visit Number 4    Number of Visits 16    Date for PT Re-Evaluation 10/03/23    Authorization Type [employer]    Authorization - Number of Visits 16    Progress Note Due on Visit 10    PT Start Time 0845    PT Stop Time 0925    PT Time Calculation (min) 40 min    Activity Tolerance Patient tolerated treatment well    Behavior During Therapy WFL for tasks assessed/performed              Past Medical History:  Diagnosis Date   Allergy     Anal fissure    Arthritis    RA   Blood transfusion without reported diagnosis    COVID-19 06/2020   Diabetes mellitus    no meds taken 05-01-16- elevated glucose was from [facility] for UC   Diarrhea    Fatty liver    GERD (gastroesophageal reflux disease)    Gout    Hiatal hernia    Hypertension    Internal hemorrhoids    Polyarthritis    PONV (postoperative nausea and vomiting)    Sessile rectal polyp 03/27/2011   Sinusitis    Tuberculosis    positive PPD- had an allergic reaction to the PPD, gets yearly chest xray   Tubular adenoma of colon    Ulcerative colitis 01/24/2011   Past Surgical History:  Procedure Laterality Date   CHOLECYSTECTOMY     COLONOSCOPY     KNEE SURGERY     Bilateral    POLYPECTOMY     SHOULDER SURGERY Bilateral    Bilateral - rotator cuff repair    TOTAL KNEE ARTHROPLASTY Bilateral    Patient Active Problem List   Diagnosis Date Noted   Open fracture of one or more phalanges of hand 11/21/2017   Chronic inflammatory arthritis 08/05/2016   High risk medication use 08/05/2016   Contracture of elbow, right 08/05/2016   History of total knee replacement, bilateral 08/05/2016   Idiopathic chronic gout 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 tophus 08/05/2016   History of bilateral rotator cuff tear repair  08/05/2016   Gastroesophageal reflux  08/05/2016   Latent tuberculosis by blood test 08/05/2016   Ulcerative colitis (HCC) 09/03/2011   Glucose intolerance (impaired glucose tolerance) 09/03/2011   Severe diarrhea 09/03/2011   URI (upper respiratory infection) 07/02/2011   Bile salt-induced diarrhea 07/02/2011   S/P cholecystectomy 07/02/2011   Ulcerative colitis (HCC) 04/03/2011   Benign neoplasm of colon 04/03/2011   Positive TB test 03/22/2011   Insomnia due to medical condition 03/22/2011   UC (ulcerative colitis) (HCC) 03/01/2011   Leg cramps 03/01/2011   Ulcerative colitis with rectal bleeding (HCC) 01/24/2011   Nonspecific abnormal finding in stool contents 01/24/2011   Heme positive stool 01/11/2011   Diarrhea 01/11/2011   Arthritis associated with inflammatory bowel disease 01/11/2011   Intentional weight loss 01/11/2011    PCP: Pete Brand MD  REFERRING PROVIDER: Genetta Kenning, MD   REFERRING DIAG:  M53.3 (ICD-10-CM) - Sacroiliac joint disease  M47.817 (ICD-10-CM) - Lumbosacral spondylosis without myelopathy    Rationale for Evaluation and Treatment: Rehabilitation  THERAPY DIAG:  Other low back pain  General weakness  Other abnormalities of gait and mobility  Chronic pain of both  shoulders  ONSET DATE: >7yr  SUBJECTIVE:                                                                                                                                                                                           SUBJECTIVE STATEMENT: Pt reports continued fatigue in quads after therapy but resolves with rest.  Feels good overall improvement in pain and function.    Initial subjective I have had bilat shoulder surgeries and TKEs. (X 18 yrs).  I am a busy guy, maybe my lifestyle may be what's causing some of my pain.  Pain everywhere.  I am on a new med that has helped a lot reduced pain intitally by 75 % now seems to be ~ 50%.  I've had falls in the past.  Pain areas (shoulders, hands, feet, LB). Interrupts sleep.  Better since the new med.  PERTINENT HISTORY:  Chronic low back pain pain generator most likely lumbar facet joints on the right side. Pain is with extension greater than with flexion   PAIN:  Are you having pain? No : NPRS scale: current 0/10;  Pain location:  Pain description: ache Aggravating factors: cold Relieving factors: sitting  PRECAUTIONS: None  RED FLAGS: None   WEIGHT BEARING RESTRICTIONS: No  FALLS:  Has patient fallen in last 6 months? No  LIVING ENVIRONMENT: Lives with: lives with their spouse Lives in: House/apartment Stairs: No Has following equipment at home: none   OCCUPATION: retired Advice worker  PLOF: Independent  PATIENT GOALS: build strength, less pain, better balance, get my pants on easier  NEXT MD VISIT: next week  OBJECTIVE:  Note: Objective measures were completed at Evaluation unless otherwise noted.  DIAGNOSTIC FINDINGS:  X-rays of the lumbar spine were consistent with degenerative disc disease and facet joint arthropathy.  X-rays of the pelvis were unremarkable   PATIENT SURVEYS:  FOTO Risk adjusted 43%; Primary measure 61% with goal of 61%  COGNITION: Overall cognitive status: Within functional limits for tasks assessed     SENSATION: WFL  MUSCLE LENGTH: Hamstrings: Ms shortened   POSTURE:  slight rounding of shoulders and forward head  PALPATION: Ms tightness throughout spine and post shoulder areas.  No tenderness   Shoulder ROM: 25 % limited with internal and external should rotation Shoulder strength: shoulder flex and abd 4/5   LUMBAR ROM:   AROM eval  Flexion FT to mid  calf P!  Extension Full P!  Right lateral flexion 50% limited P!  Left lateral flexion 50% limited P!  Right rotation   Left rotation    (Blank rows = not tested)  LOWER EXTREMITY strength:  Strength in lbs Right eval Left eval  Hip flexion 49.8 45.4  Hip extension     Hip abduction    Hip adduction 28.1 32.9  Hip internal rotation    Hip external rotation    Knee flexion    Knee extension 57.5 52.7  Ankle dorsiflexion    Ankle plantarflexion    Ankle inversion    Ankle eversion     (Blank rows = not tested)  LOWER EXTREMITY ROM:    Some limitation in hip ext and in/ex rotation due to muscle tightness  LUMBAR SPECIAL TESTS:  Slump test: Negative and SI Compression/distraction test: Negative  FUNCTIONAL TESTS:  5 times sit to stand: 19.27 Timed up and go (TUG): 10.55 4 stage balance: passed 1&2.  Tandem stance 10s; SLS 7s  GAIT: Distance walked: 500 Assistive device utilized: None Level of assistance: Complete Independence Comments: Guarded UE, decreased arm swing  TREATMENT  Pt seen for aquatic therapy today.  Treatment took place in water 3.5-4.75 ft in depth at the [facility] pool. Temp of water was 91.  Pt entered/exited the pool via stairs using alternating pattern with  hand rail.   *walking 4.2 ft forward and backward.   *L stretch x 3 VC, demonstration and tactile cues from deck *side stepping with engaged core *Farmers carry with yellow HB cues for TrA hold throughout forward and backward *TrA set/noodle press 1/2noodle->solid noodle wide stance x 10; staggered stance x10 *Bow and arrow (demonstration) *3 way hamstring stretch *SLS 3.6 ft after several tries pt complete able to hold bilaterally x 20s. *side bending 3.6 ft (ROM check)    Pt requires the buoyancy and hydrostatic pressure of water for support, and to offload joints by unweighting joint load by at least 50 % in navel deep water and by at least 75-80% in chest to neck deep water.  Viscosity of the water is needed for resistance of strengthening. Water current perturbations provides challenge to standing balance requiring increased core activation.                                                                                                                      PATIENT EDUCATION:  Education details: exercise rationale, technique, modifications  - issued initial HEP Person educated: Patient Education method: Explanation Education comprehension: verbalized understanding  HOME EXERCISE PROGRAM: Access Code: [email or web address] URL: [email or web address] Date: 08/20/2023 Prepared by: [facility] [address]  Exercises - Shoulder Extension with Resistance  - 3 x weekly - 1-2 sets - 10 reps - Standing 'L' Stretch at Counter  - 2 x daily - 7 x weekly - 2 reps - 10 seconds hold - Seated Hamstring Stretch  - 2 x daily - 7 x weekly - 2-3 reps - 15-20 hold - Seated Figure 4 Piriformis Stretch  - 2 x daily - 7 x weekly - 2 reps - 15-20 hold - Supine Lower Trunk Rotation  - 2 x daily - 7 x weekly - 3-5  reps - 5-10 hold - Supine Transversus Abdominis Bracing - Hands on Thighs  - 1 x daily - 7 x weekly - 10 reps - 5 hold  Aquatic tba  ASSESSMENT:  CLINICAL IMPRESSION: Pt reports no pain with rectus diastasis. Cuing throughout for TrA hold in all positions.  Pt edu on term. Pt reports 75% improvement in pain since new meds and injections last 2 months, with pain x 10 yrs. Continued with stretching le and core and core strengthening with very good toleration. He has met STG's #1&4. He is progressing well towards all goals.  Has only had 3 sessions since evaluation.  If time allows 5 x STS to be completed next session.    Initial Impression Patient is a 73 y.o. m who was seen today for physical therapy evaluation and treatment for LBP.  He reports generalized pain due to OA in most joints  for past 20 yrs.  Has had declined pain management up until this point as he was not interested in narcotics. Pt has begun using low does of Duloxetine  for the past month and reports significant decrease in overall pain including his back (50-75%). He is an active senior and wants to remain capable of doing the things he likes to do.  Functional and objective testing demonstrates decrease in hip and core strength, posterior core tightness and pain impeding movement all which limit his ADL's and mobility.  He will benefti from skilled PT both land based and aquatics to improve all areas of deficits  OBJECTIVE IMPAIRMENTS: decreased activity tolerance, decreased balance, decreased mobility, difficulty walking, decreased strength, impaired flexibility, postural dysfunction, and pain.   ACTIVITY LIMITATIONS: bending, sitting, squatting, stairs, transfers, reach over head, and locomotion level  PARTICIPATION LIMITATIONS: community activity and yard work  PERSONAL FACTORS: Time since onset of injury/illness/exacerbation and 1-2 comorbidities: see PmHx  are also affecting patient's functional outcome.   REHAB POTENTIAL: Good  CLINICAL DECISION MAKING: Evolving/moderate complexity  EVALUATION COMPLEXITY: High   GOALS: Goals reviewed with patient? Yes  SHORT TERM GOALS: Target date: 08/25/23  Pt will tolerate full aquatic sessions consistently without increase in pain and with improving function to demonstrate good toleration and effectiveness of intervention.  Baseline: Goal status: Met 08/25/23  2.  Pt will improve on 5 X STS test to <or= 17s (MCID) to demonstrate improving functional lower extremity strength, transitional movements, and balance Baseline: 19.27 Goal status: INITIAL  3.  Pt will complete SLS in 3.6 ft consistently @20s  or > Baseline: unable land based Goal status: In process 08/25/23  4.  Pt to report reduction in pain with aquatic intervention at <2/10 while submerged Baseline:  Goal status: Met 08/25/23  5.  Pt will have improve on Lumbar Side bending ROM by 50%. Baseline: see chart Goal status: In process 08/25/23    LONG TERM GOALS: Target date: March 21/25  Pt to meet stated Foto Goal Baseline:  Goal status: INITIAL  2.  Pt will be indep with final HEP's (land and aquatic as appropriate)  for continued management of condition Baseline:  Goal status: INITIAL  3.  Pt will improve strength in hips and shoulders by at least 10 lbs to demonstrate improved overall physical function Baseline: see chart.  Shoulder strength in lbs TBA Goal status: INITIAL  4.  Pt will complete tandem and SLS x 20s to demonstrate improvement in balance ability. Baseline:  Goal status: INITIAL  5.  Pt will report "normal" pain to be 2 or less/10 for  improve mobility Baseline: 4 Goal status: INITIAL  6.  Pt will report donning pants without difficulty Baseline: difficult due to muscle tightness in LE Goal status: INITIAL  PLAN:  PT FREQUENCY: 2x/week  PT DURATION: 8 weeks  PLANNED INTERVENTIONS: 97164- PT Re-evaluation, 97110-Therapeutic exercises, 97530- Therapeutic activity, 97112- Neuromuscular re-education, 97535- Self Care, 29562- Manual therapy, [id number]- Gait training, [phone]- Aquatic Therapy, 97014- Electrical stimulation (unattended), [phone]- Ionotophoresis 4mg /ml Dexamethasone, Patient/Family education, Balance training, Stair training, Taping, Dry Needling, Joint mobilization, DME instructions, Cryotherapy, and Moist heat.  PLAN FOR NEXT SESSION: Aquatics for core and LE stretching, pain reduction/management, balance retraining Land: UE/shoulder ROM and strengthening; LB and core strengthening; hip strengthening   Lucinda Saber) Lashanta Elbe MPT 08/25/23 9:30 AM [facility] [address] [place], [place], [address] Phone: [phone]   Fax:  [phone]     Referring diagnosis? LBP Treatment diagnosis? (if different than referring diagnosis) LBP AND shoulder pain What was this (referring dx) caused by? []  Surgery []  Fall [x]  Ongoing issue [x]  Arthritis []  Other: ____________  Laterality: []  Rt []  Lt [x]  Both  Check all possible CPT codes:  *CHOOSE 10 OR LESS*    See Planned Interventions listed in the Plan section of the  Evaluation.

## 2023-08-27 ENCOUNTER — Ambulatory Visit (HOSPITAL_BASED_OUTPATIENT_CLINIC_OR_DEPARTMENT_OTHER): Payer: Medicare PPO | Admitting: Physical Therapy

## 2023-08-27 DIAGNOSIS — M25512 Pain in left shoulder: Secondary | ICD-10-CM | POA: Diagnosis not present

## 2023-08-27 DIAGNOSIS — R2689 Other abnormalities of gait and mobility: Secondary | ICD-10-CM

## 2023-08-27 DIAGNOSIS — R531 Weakness: Secondary | ICD-10-CM | POA: Diagnosis not present

## 2023-08-27 DIAGNOSIS — M5459 Other low back pain: Secondary | ICD-10-CM | POA: Diagnosis not present

## 2023-08-27 DIAGNOSIS — G8929 Other chronic pain: Secondary | ICD-10-CM | POA: Diagnosis not present

## 2023-08-27 DIAGNOSIS — M25511 Pain in right shoulder: Secondary | ICD-10-CM | POA: Diagnosis not present

## 2023-08-27 NOTE — Therapy (Signed)
OUTPATIENT PHYSICAL THERAPY THORACOLUMBAR TREATMENT   Patient Name: Christopher Smith MRN: [id number] DOB:01-15-1951, 73 y.o., male Today's Date: 08/27/2023  END OF SESSION:     Past Medical History:  Diagnosis Date   Allergy    Anal fissure    Arthritis    RA   Blood transfusion without reported diagnosis    COVID-19 06/2020   Diabetes mellitus    no meds taken 05-01-16- elevated glucose was from [facility] for UC   Diarrhea    Fatty liver    GERD (gastroesophageal reflux disease)    Gout    Hiatal hernia    Hypertension    Internal hemorrhoids    Polyarthritis    PONV (postoperative nausea and vomiting)    Sessile rectal polyp 03/27/2011   Sinusitis    Tuberculosis    positive PPD- had an allergic reaction to the PPD, gets yearly chest xray   Tubular adenoma of colon    Ulcerative colitis 01/24/2011   Past Surgical History:  Procedure Laterality Date   CHOLECYSTECTOMY     COLONOSCOPY     KNEE SURGERY     Bilateral    POLYPECTOMY     SHOULDER SURGERY Bilateral    Bilateral - rotator cuff repair    TOTAL KNEE ARTHROPLASTY Bilateral    Patient Active Problem List   Diagnosis Date Noted   Open fracture of one or more phalanges of hand 11/21/2017   Chronic inflammatory arthritis 08/05/2016   High risk medication use 08/05/2016   Contracture of elbow, right 08/05/2016   History of total knee replacement, bilateral 08/05/2016   Idiopathic chronic gout 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 tophus 08/05/2016   History of bilateral rotator cuff tear repair 08/05/2016   Gastroesophageal reflux  08/05/2016   Latent tuberculosis by blood test 08/05/2016   Ulcerative colitis (HCC) 09/03/2011   Glucose intolerance (impaired glucose tolerance) 09/03/2011   Severe diarrhea 09/03/2011   URI (upper respiratory infection) 07/02/2011   Bile salt-induced diarrhea 07/02/2011   S/P cholecystectomy 07/02/2011   Ulcerative colitis (HCC) 04/03/2011   Benign neoplasm of colon 04/03/2011   Positive TB  test 03/22/2011   Insomnia due to medical condition 03/22/2011   UC (ulcerative colitis) (HCC) 03/01/2011   Leg cramps 03/01/2011   Ulcerative colitis with rectal bleeding (HCC) 01/24/2011   Nonspecific abnormal finding in stool contents 01/24/2011   Heme positive stool 01/11/2011   Diarrhea 01/11/2011   Arthritis associated with inflammatory bowel disease 01/11/2011   Intentional weight loss 01/11/2011    PCP: Irena Reichmann MD  REFERRING PROVIDER: Erick Colace, MD   REFERRING DIAG:  M53.3 (ICD-10-CM) - Sacroiliac joint disease  M47.817 (ICD-10-CM) - Lumbosacral spondylosis without myelopathy    Rationale for Evaluation and Treatment: Rehabilitation  THERAPY DIAG:  No diagnosis found.  ONSET DATE: >11yr  SUBJECTIVE:  SUBJECTIVE STATEMENT: The patient reports he is feeling better with his exercises. His neck is sore this morning.    Initial subjective I have had bilat shoulder surgeries and TKEs. (X 18 yrs).  I am a busy guy, maybe my lifestyle may be what's causing some of my pain.  Pain everywhere.  I am on a new med that has helped a lot reduced pain intitally by 75 % now seems to be ~ 50%.  I've had falls in the past. Pain areas (shoulders, hands, feet, LB). Interrupts sleep.  Better since the new med.  PERTINENT HISTORY:  Chronic low back pain pain generator most likely lumbar facet joints on the right side. Pain is with extension greater than with flexion   PAIN:  Are you having pain? No : NPRS scale: current 0/10;  Pain location:  Pain description: ache Aggravating factors: cold Relieving factors: sitting  PRECAUTIONS: None  RED FLAGS: None   WEIGHT BEARING RESTRICTIONS: No  FALLS:  Has patient fallen in last 6 months? No  LIVING ENVIRONMENT: Lives with: lives with  their spouse Lives in: House/apartment Stairs: No Has following equipment at home: none   OCCUPATION: retired Advice worker  PLOF: Independent  PATIENT GOALS: build strength, less pain, better balance, get my pants on easier  NEXT MD VISIT: next week  OBJECTIVE:  Note: Objective measures were completed at Evaluation unless otherwise noted.  DIAGNOSTIC FINDINGS:  X-rays of the lumbar spine were consistent with degenerative disc disease and facet joint arthropathy.  X-rays of the pelvis were unremarkable   PATIENT SURVEYS:  FOTO Risk adjusted 43%; Primary measure 61% with goal of 61%  COGNITION: Overall cognitive status: Within functional limits for tasks assessed     SENSATION: WFL  MUSCLE LENGTH: Hamstrings: Ms shortened   POSTURE:  slight rounding of shoulders and forward head  PALPATION: Ms tightness throughout spine and post shoulder areas.  No tenderness   Shoulder ROM: 25 % limited with internal and external should rotation Shoulder strength: shoulder flex and abd 4/5   LUMBAR ROM:   AROM eval  Flexion FT to mid  calf P!  Extension Full P!  Right lateral flexion 50% limited P!  Left lateral flexion 50% limited P!  Right rotation   Left rotation    (Blank rows = not tested)  LOWER EXTREMITY strength:     Strength in lbs Right eval Left eval  Hip flexion 49.8 45.4  Hip extension    Hip abduction    Hip adduction 28.1 32.9  Hip internal rotation    Hip external rotation    Knee flexion    Knee extension 57.5 52.7  Ankle dorsiflexion    Ankle plantarflexion    Ankle inversion    Ankle eversion     (Blank rows = not tested)  LOWER EXTREMITY ROM:    Some limitation in hip ext and in/ex rotation due to muscle tightness  LUMBAR SPECIAL TESTS:  Slump test: Negative and SI Compression/distraction test: Negative  FUNCTIONAL TESTS:  5 times sit to stand: 19.27 Timed up and go (TUG): 10.55 4 stage balance: passed 1&2.  Tandem stance 10s; SLS  7s  GAIT: Distance walked: 500 Assistive device utilized: None Level of assistance: Complete Independence Comments: Guarded UE, decreased arm swing  TREATMENT   Manual:  reviewed use of thera-cane for self trigger point release. Skilled palpation of trigger points. Sub-occipital release. Gentle manual traction. Reviewed use of thera-cane.    There-ex: Upper trap stretch 2x20 sec  Levator stretch 2x20 sec Pain free cervical rotation 3x3    There-Act  Self Care:  Established HEP; reviewed where to buy thera-cane   Nuro-Re-ed            Row green 2x12 reviewed RPE RPE of 6 with green  Shoulder extension 2x12 green    with abdominal breathing for core strength and postrue    Last visit   Pt seen for aquatic therapy today.  Treatment took place in water 3.5-4.75 ft in depth at the [facility] pool. Temp of water was 91.  Pt entered/exited the pool via stairs using alternating pattern with  hand rail.   *walking 4.2 ft forward and backward.   *L stretch x 3 VC, demonstration and tactile cues from deck *side stepping with engaged core *Farmers carry with yellow HB cues for TrA hold throughout forward and backward *TrA set/noodle press 1/2noodle->solid noodle wide stance x 10; staggered stance x10 *Bow and arrow (demonstration) *3 way hamstring stretch *SLS 3.6 ft after several tries pt complete able to hold bilaterally x 20s. *side bending 3.6 ft (ROM check)    Pt requires the buoyancy and hydrostatic pressure of water for support, and to offload joints by unweighting joint load by at least 50 % in navel deep water and by at least 75-80% in chest to neck deep water.  Viscosity of the water is needed for resistance of strengthening. Water current perturbations provides challenge to standing balance requiring increased core activation.                                                                                                                     PATIENT  EDUCATION:  Education details: exercise rationale, technique, modifications  - issued initial HEP Person educated: Patient Education method: Explanation Education comprehension: verbalized understanding  HOME EXERCISE PROGRAM: Access Code: [email or web address] URL: [email or web address] Date: 08/20/2023 Prepared by: [facility] [address]  Exercises - Shoulder Extension with Resistance  - 3 x weekly - 1-2 sets - 10 reps - Standing 'L' Stretch at Counter  - 2 x daily - 7 x weekly - 2 reps - 10 seconds hold - Seated Hamstring Stretch  - 2 x daily - 7 x weekly - 2-3 reps - 15-20 hold - Seated Figure 4 Piriformis Stretch  - 2 x daily - 7 x weekly - 2 reps - 15-20 hold - Supine Lower Trunk Rotation  - 2 x daily - 7 x weekly - 3-5 reps - 5-10 hold - Supine Transversus Abdominis Bracing - Hands on Thighs  - 1 x daily - 7 x weekly - 10 reps - 5 hold  Aquatic tba  ASSESSMENT:  CLINICAL IMPRESSION: Therapy reviewed postural exercises with TA breathing. He tolerated well. We reviewed how posterior chain strengthening can reduce cervical tension. He had no increase in pain. Therapy preordered manual therapy to cervical musculature and upper traps. We reviewed how he can do it on his own at home. He had improved rotation  following manual therapy. He was given a band program to work on at home.    Initial Impression Patient is a 73 y.o. m who was seen today for physical therapy evaluation and treatment for LBP.  He reports generalized pain due to OA in most joints  for past 20 yrs.  Has had declined pain management up until this point as he was not interested in narcotics. Pt has begun using low does of Duloxetine for the past month and reports significant decrease in overall pain including his back (50-75%). He is an active senior and wants to remain capable of doing the things he likes to do. Functional and objective testing demonstrates decrease in hip and core strength,  posterior core tightness and pain impeding movement all which limit his ADL's and mobility.  He will benefti from skilled PT both land based and aquatics to improve all areas of deficits  OBJECTIVE IMPAIRMENTS: decreased activity tolerance, decreased balance, decreased mobility, difficulty walking, decreased strength, impaired flexibility, postural dysfunction, and pain.   ACTIVITY LIMITATIONS: bending, sitting, squatting, stairs, transfers, reach over head, and locomotion level  PARTICIPATION LIMITATIONS: community activity and yard work  PERSONAL FACTORS: Time since onset of injury/illness/exacerbation and 1-2 comorbidities: see PmHx  are also affecting patient's functional outcome.   REHAB POTENTIAL: Good  CLINICAL DECISION MAKING: Evolving/moderate complexity  EVALUATION COMPLEXITY: High   GOALS: Goals reviewed with patient? Yes  SHORT TERM GOALS: Target date: 08/25/23  Pt will tolerate full aquatic sessions consistently without increase in pain and with improving function to demonstrate good toleration and effectiveness of intervention.  Baseline: Goal status: Met 08/25/23  2.  Pt will improve on 5 X STS test to <or= 17s (MCID) to demonstrate improving functional lower extremity strength, transitional movements, and balance Baseline: 19.27 Goal status: INITIAL  3.  Pt will complete SLS in 3.6 ft consistently @20s  or > Baseline: unable land based Goal status: In process 08/25/23  4.  Pt to report reduction in pain with aquatic intervention at <2/10 while submerged Baseline:  Goal status: Met 08/25/23  5.  Pt will have improve on Lumbar Side bending ROM by 50%. Baseline: see chart Goal status: In process 08/25/23    LONG TERM GOALS: Target date: March 21/25  Pt to meet stated Foto Goal Baseline:  Goal status: INITIAL  2.  Pt will be indep with final HEP's (land and aquatic as appropriate) for continued management of condition Baseline:  Goal status: INITIAL  3.  Pt  will improve strength in hips and shoulders by at least 10 lbs to demonstrate improved overall physical function Baseline: see chart.  Shoulder strength in lbs TBA Goal status: INITIAL  4.  Pt will complete tandem and SLS x 20s to demonstrate improvement in balance ability. Baseline:  Goal status: INITIAL  5.  Pt will report "normal" pain to be 2 or less/10 for improve mobility Baseline: 4 Goal status: INITIAL  6.  Pt will report donning pants without difficulty Baseline: difficult due to muscle tightness in LE Goal status: INITIAL  PLAN:  PT FREQUENCY: 2x/week  PT DURATION: 8 weeks  PLANNED INTERVENTIONS: 97164- PT Re-evaluation, 97110-Therapeutic exercises, 97530- Therapeutic activity, 97112- Neuromuscular re-education, 97535- Self Care, 96045- Manual therapy, [id number]- Gait training, [phone]- Aquatic Therapy, 97014- Electrical stimulation (unattended), [phone]- Ionotophoresis 4mg /ml Dexamethasone, Patient/Family education, Balance training, Stair training, Taping, Dry Needling, Joint mobilization, DME instructions, Cryotherapy, and Moist heat.  PLAN FOR NEXT SESSION: Aquatics for core and LE stretching, pain reduction/management, balance retraining Land:  UE/shoulder ROM and strengthening; LB and core strengthening; hip strengthening   Lorayne Bender PT DPT 08/27/23 8:35 AM [facility] [address] [place], [place], [address] Phone: [phone]   Fax:  [phone]     Referring diagnosis? LBP Treatment diagnosis? (if different than referring diagnosis) LBP AND shoulder pain What was this (referring dx) caused by? []  Surgery []  Fall [x]  Ongoing issue [x]  Arthritis []  Other: ____________  Laterality: []  Rt []  Lt [x]  Both  Check all possible CPT codes:  *CHOOSE 10 OR LESS*    See Planned Interventions listed in the Plan section of the Evaluation.

## 2023-09-01 ENCOUNTER — Encounter (HOSPITAL_BASED_OUTPATIENT_CLINIC_OR_DEPARTMENT_OTHER): Payer: Self-pay | Admitting: Physical Therapy

## 2023-09-01 ENCOUNTER — Ambulatory Visit (HOSPITAL_BASED_OUTPATIENT_CLINIC_OR_DEPARTMENT_OTHER): Payer: Medicare PPO | Admitting: Physical Therapy

## 2023-09-01 DIAGNOSIS — M5459 Other low back pain: Secondary | ICD-10-CM | POA: Diagnosis not present

## 2023-09-01 DIAGNOSIS — R2689 Other abnormalities of gait and mobility: Secondary | ICD-10-CM

## 2023-09-01 DIAGNOSIS — R531 Weakness: Secondary | ICD-10-CM

## 2023-09-01 DIAGNOSIS — M25512 Pain in left shoulder: Secondary | ICD-10-CM | POA: Diagnosis not present

## 2023-09-01 DIAGNOSIS — G8929 Other chronic pain: Secondary | ICD-10-CM | POA: Diagnosis not present

## 2023-09-01 DIAGNOSIS — M25511 Pain in right shoulder: Secondary | ICD-10-CM | POA: Diagnosis not present

## 2023-09-01 NOTE — Therapy (Signed)
OUTPATIENT PHYSICAL THERAPY THORACOLUMBAR TREATMENT   Patient Name: Christopher Smith MRN: [id number] DOB:1951-05-21, 73 y.o., male Today's Date: 09/01/2023  END OF SESSION:  PT End of Session - 09/01/23 0855     Visit Number 6    Number of Visits 16    Date for PT Re-Evaluation 10/03/23    Authorization Type human[employer]    Authorization Time Period 16 visits approved From 01.20.2025 - 03.21.2025 Authorization #[id number]    Progress Note Due on Visit 10    PT Start Time 0847    PT Stop Time 0925    PT Time Calculation (min) 38 min    Behavior During Therapy [facility] for tasks assessed/performed               Past Medical History:  Diagnosis Date   Allergy    Anal fissure    Arthritis    RA   Blood transfusion without reported diagnosis    COVID-19 06/2020   Diabetes mellitus    no meds taken 05-01-16- elevated glucose was from predisone for UC   Diarrhea    Fatty liver    GERD (gastroesophageal reflux disease)    Gout    Hiatal hernia    Hypertension    Internal hemorrhoids    Polyarthritis    PONV (postoperative nausea and vomiting)    Sessile rectal polyp 03/27/2011   Sinusitis    Tuberculosis    positive PPD- had an allergic reaction to the PPD, gets yearly chest xray   Tubular adenoma of colon    Ulcerative colitis 01/24/2011   Past Surgical History:  Procedure Laterality Date   CHOLECYSTECTOMY     COLONOSCOPY     KNEE SURGERY     Bilateral    POLYPECTOMY     SHOULDER SURGERY Bilateral    Bilateral - rotator cuff repair    TOTAL KNEE ARTHROPLASTY Bilateral    Patient Active Problem List   Diagnosis Date Noted   Open fracture of one or more phalanges of hand 11/21/2017   Chronic inflammatory arthritis 08/05/2016   High risk medication use 08/05/2016   Contracture of elbow, right 08/05/2016   History of total knee replacement, bilateral 08/05/2016   Idiopathic chronic gout 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 tophus 08/05/2016   History of bilateral rotator cuff tear  repair 08/05/2016   Gastroesophageal reflux  08/05/2016   Latent tuberculosis by blood test 08/05/2016   Ulcerative colitis (HCC) 09/03/2011   Glucose intolerance (impaired glucose tolerance) 09/03/2011   Severe diarrhea 09/03/2011   URI (upper respiratory infection) 07/02/2011   Bile salt-induced diarrhea 07/02/2011   S/P cholecystectomy 07/02/2011   Ulcerative colitis (HCC) 04/03/2011   Benign neoplasm of colon 04/03/2011   Positive TB test 03/22/2011   Insomnia due to medical condition 03/22/2011   UC (ulcerative colitis) (HCC) 03/01/2011   Leg cramps 03/01/2011   Ulcerative colitis with rectal bleeding (HCC) 01/24/2011   Nonspecific abnormal finding in stool contents 01/24/2011   Heme positive stool 01/11/2011   Diarrhea 01/11/2011   Arthritis associated with inflammatory bowel disease 01/11/2011   Intentional weight loss 01/11/2011    PCP: Irena Reichmann MD  REFERRING PROVIDER: Erick Colace, MD   REFERRING DIAG:  M53.3 (ICD-10-CM) - Sacroiliac joint disease  M47.817 (ICD-10-CM) - Lumbosacral spondylosis without myelopathy    Rationale for Evaluation and Treatment: Rehabilitation  THERAPY DIAG:  Other low back pain  General weakness  Other abnormalities of gait and mobility  ONSET DATE: >13yr  SUBJECTIVE:  SUBJECTIVE STATEMENT: "I not used to this... waking up with no pain."  Plans to check out pool schedule at [facility] soon, and also check out [facility].   POOL ACCESS:  Member of [employer], but has not attended.     Initial subjective I have had bilat shoulder surgeries and TKEs. (X 18 yrs).  I am a busy guy, maybe my lifestyle may be what's causing some of my pain.  Pain everywhere.  I am on a new med that has helped a lot reduced pain intitally by 75 % now seems to be ~ 50%.   I've had falls in the past. Pain areas (shoulders, hands, feet, LB). Interrupts sleep.  Better since the new med.  PERTINENT HISTORY:  Chronic low back pain pain generator most likely lumbar facet joints on the right side. Pain is with extension greater than with flexion   PAIN:  Are you having pain? yes : NPRS scale: current 1/10;  Pain location: Lt neck/ upper trap Pain description: ache Aggravating factors: turning head to L Relieving factors: sitting  PRECAUTIONS: None  RED FLAGS: None   WEIGHT BEARING RESTRICTIONS: No  FALLS:  Has patient fallen in last 6 months? No  LIVING ENVIRONMENT: Lives with: lives with their spouse Lives in: House/apartment Stairs: No Has following equipment at home: none   OCCUPATION: retired Advice worker  PLOF: Independent  PATIENT GOALS: build strength, less pain, better balance, get my pants on easier  NEXT MD VISIT: next week  OBJECTIVE:  Note: Objective measures were completed at Evaluation unless otherwise noted.  DIAGNOSTIC FINDINGS:  X-rays of the lumbar spine were consistent with degenerative disc disease and facet joint arthropathy.  X-rays of the pelvis were unremarkable   PATIENT SURVEYS:  FOTO Risk adjusted 43%; Primary measure 61% with goal of 61%  COGNITION: Overall cognitive status: Within functional limits for tasks assessed     SENSATION: WFL  MUSCLE LENGTH: Hamstrings: Ms shortened   POSTURE:  slight rounding of shoulders and forward head  PALPATION: Ms tightness throughout spine and post shoulder areas.  No tenderness   Shoulder ROM: 25 % limited with internal and external should rotation Shoulder strength: shoulder flex and abd 4/5   LUMBAR ROM:   AROM eval  Flexion FT to mid  calf P!  Extension Full P!  Right lateral flexion 50% limited P!  Left lateral flexion 50% limited P!  Right rotation   Left rotation    (Blank rows = not tested)  LOWER EXTREMITY strength:     Strength in lbs  Right eval Left eval  Hip flexion 49.8 45.4  Hip extension    Hip abduction    Hip adduction 28.1 32.9  Hip internal rotation    Hip external rotation    Knee flexion    Knee extension 57.5 52.7  Ankle dorsiflexion    Ankle plantarflexion    Ankle inversion    Ankle eversion     (Blank rows = not tested)  LOWER EXTREMITY ROM:    Some limitation in hip ext and in/ex rotation due to muscle tightness  LUMBAR SPECIAL TESTS:  Slump test: Negative and SI Compression/distraction test: Negative  FUNCTIONAL TESTS:  5 times sit to stand: 19.27 Timed up and go (TUG): 10.55 4 stage balance: passed 1&2.  Tandem stance 10s; SLS 7s  GAIT: Distance walked: 500 Assistive device utilized: None Level of assistance: Complete Independence Comments: Guarded UE, decreased arm swing  TREATMENT  09/01/23 Pt seen for aquatic therapy  today.  Treatment took place in water 3.5-4.75 ft in depth at the [facility] pool. Temp of water was 91.  Pt entered/exited the pool via stairs using step-through pattern with  hand rail.   *walking 4.2 ft forward and backward with reciprocal arm swing 3 laps *Farmers carry with yellow hand floats, cues for TrA hold throughout, marching forward and backward *side stepping with arm addct/ abdct with yellow hand floats x 4 laps *TrA set/noodle press solid noodle to thighs in staggered stance x10 each LE forward * TrA set with knee taps to rainbow hand floats while marching forward backward (same side/ opposite side) * marching with alternating row motion with rainbow hand floats  *L stretch with UE on wall x 20s x 2, 2nd rep with gentle wag the tail * STS from 3rd step in water with forward arm reach, controlled descent, cues for core engagement and to breathe through motion * return to walking forward and backward in 4+ ft of water *SLS in 4+ ft of water, arms out to side x 20s each  * R/L hamstring stretch with foot on 3rd step x 20s each    Last Land  visit:  Manual:  reviewed use of thera-cane for self trigger point release. Skilled palpation of trigger points. Sub-occipital release. Gentle manual traction. Reviewed use of thera-cane.    There-ex: Upper trap stretch 2x20 sec  Levator stretch 2x20 sec Pain free cervical rotation 3x3    Self Care:  Established HEP; reviewed where to buy thera-cane   Nuro-Re-ed            Row green 2x12 reviewed RPE RPE of 6 with green  Shoulder extension 2x12 green  with abdominal breathing for core strength and postrue       PATIENT EDUCATION:  Education details: exercise rationale, technique, modifications   Person educated: Patient Education method: Explanation Education comprehension: verbalized understanding  HOME EXERCISE PROGRAM: Access Code: [email or web address] URL: [email or web address] Date: 08/20/2023 Prepared by: [facility] [address]  Exercises - Shoulder Extension with Resistance  - 3 x weekly - 1-2 sets - 10 reps - Standing 'L' Stretch at Counter  - 2 x daily - 7 x weekly - 2 reps - 10 seconds hold - Seated Hamstring Stretch  - 2 x daily - 7 x weekly - 2-3 reps - 15-20 hold - Seated Figure 4 Piriformis Stretch  - 2 x daily - 7 x weekly - 2 reps - 15-20 hold - Supine Lower Trunk Rotation  - 2 x daily - 7 x weekly - 3-5 reps - 5-10 hold - Supine Transversus Abdominis Bracing - Hands on Thighs  - 1 x daily - 7 x weekly - 10 reps - 5 hold  Aquatic tba  ASSESSMENT:  CLINICAL IMPRESSION: Good response to therapy thus far; reporting significant improvement in back and neck symptoms. He tolerated all exercises well, without production of pain - just occasional increase in tightness in back.  He tolerated STS on 3rd step well.  Will assess 5x STS and SLS in 57ft6" next session to address remaining STG.   Initial Impression Patient is a 73 y.o. m who was seen today for physical therapy evaluation and treatment for LBP.  He reports generalized pain due to  OA in most joints  for past 20 yrs.  Has had declined pain management up until this point as he was not interested in narcotics. Pt has begun using low does of Duloxetine for the past  month and reports significant decrease in overall pain including his back (50-75%). He is an active senior and wants to remain capable of doing the things he likes to do. Functional and objective testing demonstrates decrease in hip and core strength, posterior core tightness and pain impeding movement all which limit his ADL's and mobility.  He will benefti from skilled PT both land based and aquatics to improve all areas of deficits  OBJECTIVE IMPAIRMENTS: decreased activity tolerance, decreased balance, decreased mobility, difficulty walking, decreased strength, impaired flexibility, postural dysfunction, and pain.   ACTIVITY LIMITATIONS: bending, sitting, squatting, stairs, transfers, reach over head, and locomotion level  PARTICIPATION LIMITATIONS: community activity and yard work  PERSONAL FACTORS: Time since onset of injury/illness/exacerbation and 1-2 comorbidities: see PmHx  are also affecting patient's functional outcome.   REHAB POTENTIAL: Good  CLINICAL DECISION MAKING: Evolving/moderate complexity  EVALUATION COMPLEXITY: High   GOALS: Goals reviewed with patient? Yes  SHORT TERM GOALS: Target date: 08/25/23  Pt will tolerate full aquatic sessions consistently without increase in pain and with improving function to demonstrate good toleration and effectiveness of intervention.  Baseline: Goal status: Met 08/25/23  2.  Pt will improve on 5 X STS test to <or= 17s (MCID) to demonstrate improving functional lower extremity strength, transitional movements, and balance Baseline: 19.27 Goal status: INITIAL  3.  Pt will complete SLS in 3.6 ft consistently @20s  or > Baseline: unable land based Goal status: In process 08/25/23  4.  Pt to report reduction in pain with aquatic intervention at <2/10 while  submerged Baseline:  Goal status: Met 08/25/23  5.  Pt will have improve on Lumbar Side bending ROM by 50%. Baseline: see chart Goal status: In process 08/25/23    LONG TERM GOALS: Target date: 10/03/23  Pt to meet stated Foto Goal Baseline:  Goal status: INITIAL  2.  Pt will be indep with final HEP's (land and aquatic as appropriate) for continued management of condition Baseline:  Goal status: INITIAL  3.  Pt will improve strength in hips and shoulders by at least 10 lbs to demonstrate improved overall physical function Baseline: see chart.  Shoulder strength in lbs TBA Goal status: INITIAL  4.  Pt will complete tandem and SLS x 20s to demonstrate improvement in balance ability. Baseline:  Goal status: INITIAL  5.  Pt will report "normal" pain to be 2 or less/10 for improve mobility Baseline: 4 Goal status: IN PROGRESS - 09/01/23  6.  Pt will report donning pants without difficulty Baseline: difficult due to muscle tightness in LE Goal status: INITIAL  PLAN:  PT FREQUENCY: 2x/week  PT DURATION: 8 weeks  PLANNED INTERVENTIONS: 97164- PT Re-evaluation, 97110-Therapeutic exercises, 97530- Therapeutic activity, 97112- Neuromuscular re-education, 97535- Self Care, 57846- Manual therapy, [id number]- Gait training, [phone]- Aquatic Therapy, 97014- Electrical stimulation (unattended), [phone]- Ionotophoresis 4mg /ml Dexamethasone, Patient/Family education, Balance training, Stair training, Taping, Dry Needling, Joint mobilization, DME instructions, Cryotherapy, and Moist heat.  PLAN FOR NEXT SESSION: Aquatics for core and LE stretching, pain reduction/management, balance retraining Land: UE/shoulder ROM and strengthening; LB and core strengthening; hip strengthening   Mayer Camel, PTA 09/01/23 12:27 PM [facility] [address] [place], [place], [address] Phone: [phone]   Fax:  [phone]      Referring  diagnosis? LBP Treatment diagnosis? (if different than referring diagnosis) LBP AND shoulder pain What was this (referring dx) caused by? []  Surgery []  Fall [x]  Ongoing issue [x]  Arthritis []  Other: ____________  Laterality: []   Rt []  Lt [x]  Both  Check all possible CPT codes:  *CHOOSE 10 OR LESS*    See Planned Interventions listed in the Plan section of the Evaluation.

## 2023-09-02 ENCOUNTER — Ambulatory Visit (AMBULATORY_SURGERY_CENTER): Payer: Medicare PPO | Admitting: Internal Medicine

## 2023-09-02 ENCOUNTER — Encounter: Payer: Self-pay | Admitting: Internal Medicine

## 2023-09-02 VITALS — BP 115/63 | HR 63 | Temp 97.8°F | Resp 16 | Ht 71.0 in | Wt 226.0 lb

## 2023-09-02 DIAGNOSIS — K529 Noninfective gastroenteritis and colitis, unspecified: Secondary | ICD-10-CM | POA: Diagnosis not present

## 2023-09-02 DIAGNOSIS — E119 Type 2 diabetes mellitus without complications: Secondary | ICD-10-CM | POA: Diagnosis not present

## 2023-09-02 DIAGNOSIS — K648 Other hemorrhoids: Secondary | ICD-10-CM

## 2023-09-02 DIAGNOSIS — Z1211 Encounter for screening for malignant neoplasm of colon: Secondary | ICD-10-CM

## 2023-09-02 DIAGNOSIS — K6289 Other specified diseases of anus and rectum: Secondary | ICD-10-CM | POA: Diagnosis not present

## 2023-09-02 DIAGNOSIS — K5100 Ulcerative (chronic) pancolitis without complications: Secondary | ICD-10-CM

## 2023-09-02 DIAGNOSIS — M069 Rheumatoid arthritis, unspecified: Secondary | ICD-10-CM | POA: Diagnosis not present

## 2023-09-02 DIAGNOSIS — K760 Fatty (change of) liver, not elsewhere classified: Secondary | ICD-10-CM | POA: Diagnosis not present

## 2023-09-02 MED ORDER — SODIUM CHLORIDE 0.9 % IV SOLN: 500.0000 mL | Freq: Once | INTRAVENOUS | Status: DC

## 2023-09-02 NOTE — Progress Notes (Signed)
 Pt's states no medical or surgical changes since previsit or office visit.

## 2023-09-02 NOTE — Op Note (Signed)
[facility] Patient Name: Christopher Smith Procedure Date: 09/02/2023 9:32 AM MRN: [id number] Endoscopist: Beverley Fiedler , MD, [id number] Age: 73 Referring MD:  Date of Birth: 11-11-1950 Gender: Male Account #: [id number] Procedure:                Colonoscopy Indications:              High risk colon cancer surveillance: Ulcerative                            pancolitis of 8 (or more) years duration, Last                            colonoscopy: February 2023; Entyvio started April                            2023 (after Mayo score 2 at last exam while on                            Stelara, previous failure/Ab formation to multiple                            anti-TNF meds), currently pt reports breakthrough                            colitis symptoms 1-2 weeks before every 8 week                            infusion Medicines:                Monitored Anesthesia Care Procedure:                Pre-Anesthesia Assessment:                           - Prior to the procedure, a History and Physical                            was performed, and patient medications and                            allergies were reviewed. The patient's tolerance of                            previous anesthesia was also reviewed. The risks                            and benefits of the procedure and the sedation                            options and risks were discussed with the patient.                            All questions were answered, and informed consent  was obtained. Prior Anticoagulants: The patient has                            taken no anticoagulant or antiplatelet agents. ASA                            Grade Assessment: II - A patient with mild systemic                            disease. After reviewing the risks and benefits,                            the patient was deemed in satisfactory condition to                            undergo the procedure.                            After obtaining informed consent, the colonoscope                            was passed under direct vision. Throughout the                            procedure, the patient's blood pressure, pulse, and                            oxygen saturations were monitored continuously. The                            CF [id number] #[id number] was introduced through the anus                            and advanced to the terminal ileum. The colonoscopy                            was performed without difficulty. The patient                            tolerated the procedure well. The quality of the                            bowel preparation was good. The terminal ileum,                            ileocecal valve, appendiceal orifice, and rectum                            were photographed. Scope In: 9:41:05 AM Scope Out: 9:52:53 AM Scope Withdrawal Time: 0 hours 9 minutes 59 seconds  Total Procedure Duration: 0 hours 11 minutes 48 seconds  Findings:                 The digital rectal exam was normal.  The terminal ileum appeared normal.                           Inflammation was found in the colon. This was                            graded as Mayo Score 1 (mild, with erythema,                            decreased vascular pattern, mild friability), and                            when compared to the previous examination, the                            findings are improved. Four biopsies were taken                            every 10 cm with a cold forceps from the entire                            colon for dysplasia surveillance and ulcerative                            colitis surveillance. These biopsy specimens from                            the cecum/ascending colon/transverse (jar 1),                            descending/sigmoid colon (jar 2) and rectum (jar 3)                            were sent to Pathology.                           Internal  hemorrhoids were found during                            retroflexion. The hemorrhoids were small. Complications:            No immediate complications. Estimated Blood Loss:     Estimated blood loss was minimal. Impression:               - The examined portion of the ileum was normal.                           - Mild (Mayo Score 1) pancolitis ulcerative                            colitis, improved since the last examination.                            Biopsied.                           -  Internal hemorrhoids. Recommendation:           - Patient has a contact number available for                            emergencies. The signs and symptoms of potential                            delayed complications were discussed with the                            patient. Return to normal activities tomorrow.                            Written discharge instructions were provided to the                            patient.                           - Resume previous diet.                           - Continue present medications. Increase frequency                            of Entyvio infusions to every 6 weeks.                           - Await pathology results.                           - Repeat colonoscopy is recommended for                            surveillance. The colonoscopy date will be                            determined after pathology results from today's                            exam become available for review. Beverley Fiedler, MD 09/02/2023 9:59:23 AM This report has been signed electronically.

## 2023-09-02 NOTE — Progress Notes (Signed)
 Report to PACU, RN, vss, BBS= Clear.

## 2023-09-02 NOTE — Patient Instructions (Signed)
Please read handouts provided. Continue present medications. Await pathology results. Resume previous diet. Increase frequency of Entyvio to every 6 weeks.   YOU HAD AN ENDOSCOPIC PROCEDURE TODAY AT THE [facility]:   Refer to the procedure report that was given to you for any specific questions about what was found during the examination.  If the procedure report does not answer your questions, please call your gastroenterologist to clarify.  If you requested that your care partner not be given the details of your procedure findings, then the procedure report has been included in a sealed envelope for you to review at your convenience later.  YOU SHOULD EXPECT: Some feelings of bloating in the abdomen. Passage of more gas than usual.  Walking can help get rid of the air that was put into your GI tract during the procedure and reduce the bloating. If you had a lower endoscopy (such as a colonoscopy or flexible sigmoidoscopy) you may notice spotting of blood in your stool or on the toilet paper. If you underwent a bowel prep for your procedure, you may not have a normal bowel movement for a few days.  Please Note:  You might notice some irritation and congestion in your nose or some drainage.  This is from the oxygen used during your procedure.  There is no need for concern and it should clear up in a day or so.  SYMPTOMS TO REPORT IMMEDIATELY:YOU HAD AN ENDOSCOPIC PROCEDURE TODAY AT THE  ENDOSCOPY CENTER:   Refer to the procedure report that was given to you for any specific questions about what was found during the examination.  If the procedure report does not answer your questions, please call your gastroenterologist to clarify.  If you requested that your care partner not be given the details of your procedure findings, then the procedure report has been included in a sealed envelope for you to review at your convenience later.  YOU SHOULD EXPECT: Some feelings of bloating in  the abdomen. Passage of more gas than usual.  Walking can help get rid of the air that was put into your GI tract during the procedure and reduce the bloating. If you had a lower endoscopy (such as a colonoscopy or flexible sigmoidoscopy) you may notice spotting of blood in your stool or on the toilet paper. If you underwent a bowel prep for your procedure, you may not have a normal bowel movement for a few days.  Please Note:  You might notice some irritation and congestion in your nose or some drainage.  This is from the oxygen used during your procedure.  There is no need for concern and it should clear up in a day or so.  SYMPTOMS TO REPORT IMMEDIATELY:  Following lower endoscopy (colonoscopy or flexible sigmoidoscopy):  Excessive amounts of blood in the stool  Significant tenderness or worsening of abdominal pains  Swelling of the abdomen that is new, acute  Fever of 100F or higher.  For urgent or emergent issues, a gastroenterologist can be reached at any hour by calling [phone]. Do not use MyChart messaging for urgent concerns.    DIET:  We do recommend a small meal at first, but then you may proceed to your regular diet.  Drink plenty of fluids but you should avoid alcoholic beverages for 24 hours.  ACTIVITY:  You should plan to take it easy for the rest of today and you should NOT DRIVE or use heavy machinery until tomorrow (because of the sedation  medicines used during the test).    FOLLOW UP: Our staff will call the number listed on your records the next business day following your procedure.  We will call around 7:15- 8:00 am to check on you and address any questions or concerns that you may have regarding the information given to you following your procedure. If we do not reach you, we will leave a message.     If any biopsies were taken you will be contacted by phone or by letter within the next 1-3 weeks.  Please call us at [phone] if you have not heard about the  biopsies in 3 weeks.    SIGNATURES/CONFIDENTIALITY: You and/or your care partner have signed paperwork which will be entered into your electronic medical record.  These signatures attest to the fact that that the information above on your After Visit Summary has been reviewed and is understood.  Full responsibility of the confidentiality of this discharge information lies with you and/or your care-partner.   For urgent or emergent issues, a gastroenterologist can be reached at any hour by calling [phone]. Do not use MyChart messaging for urgent concerns.    DIET:  We do recommend a small meal at first, but then you may proceed to your regular diet.  Drink plenty of fluids but you should avoid alcoholic beverages for 24 hours.  ACTIVITY:  You should plan to take it easy for the rest of today and you should NOT DRIVE or use heavy machinery until tomorrow (because of the sedation medicines used during the test).    FOLLOW UP: Our staff will call the number listed on your records the next business day following your procedure.  We will call around 7:15- 8:00 am to check on you and address any questions or concerns that you may have regarding the information given to you following your procedure. If we do not reach you, we will leave a message.     If any biopsies were taken you will be contacted by phone or by letter within the next 1-3 weeks.  Please call us at [phone] if you have not heard about the biopsies in 3 weeks.    SIGNATURES/CONFIDENTIALITY: You and/or your care partner have signed paperwork which will be entered into your electronic medical record.  These signatures attest to the fact that that the information above on your After Visit Summary has been reviewed and is understood.  Full responsibility of the confidentiality of this discharge information lies with you and/or your care-partner.

## 2023-09-02 NOTE — Progress Notes (Signed)
 Called to room to assist during endoscopic procedure.  Patient ID and intended procedure confirmed with present staff. Received instructions for my participation in the procedure from the performing physician.

## 2023-09-02 NOTE — Progress Notes (Signed)
GASTROENTEROLOGY PROCEDURE H&P NOTE   Primary Care Physician: Irena Reichmann, DO    Reason for Procedure:   UC  Plan:    colonoscopy  Patient is appropriate for endoscopic procedure(s) in the ambulatory ([facility]) setting.  The nature of the procedure, as well as the risks, benefits, and alternatives were carefully and thoroughly reviewed with the patient. Ample time for discussion and questions allowed. The patient understood, was satisfied, and agreed to proceed.     HPI: Christopher Smith is a 73 y.o. male who presents for colonoscopy.  Medical history as below.  Tolerated the prep.  No recent chest pain or shortness of breath.  No abdominal pain today.  Past Medical History:  Diagnosis Date   Allergy    Anal fissure    Arthritis    RA   Blood transfusion without reported diagnosis    COVID-19 06/2020   Diabetes mellitus    no meds taken 05-01-16- elevated glucose was from predisone for UC   Diarrhea    Fatty liver    GERD (gastroesophageal reflux disease)    Gout    Hiatal hernia    Hypertension    Internal hemorrhoids    Polyarthritis    PONV (postoperative nausea and vomiting)    Sessile rectal polyp 03/27/2011   Sinusitis    Tuberculosis    positive PPD- had an allergic reaction to the PPD, gets yearly chest xray   Tubular adenoma of colon    Ulcerative colitis 01/24/2011    Past Surgical History:  Procedure Laterality Date   CHOLECYSTECTOMY     COLONOSCOPY     KNEE SURGERY     Bilateral    POLYPECTOMY     SHOULDER SURGERY Bilateral    Bilateral - rotator cuff repair    TOTAL KNEE ARTHROPLASTY Bilateral     Prior to Admission medications   Medication Sig Start Date End Date Taking? Authorizing Provider  allopurinol (ZYLOPRIM) 300 MG tablet TAKE 1 TABLET BY MOUTH EVERY DAY 07/22/23  Yes Gearldine Bienenstock, PA-C  DULoxetine (CYMBALTA) 30 MG capsule Take 1 capsule (30 mg total) by mouth daily. 08/04/23  Yes Kirsteins, Victorino Sparrow, MD  folic acid (FOLVITE) 1 MG  tablet Take 1 tablet (1 mg total) by mouth daily. 10/09/22  Yes Gearldine Bienenstock, PA-C  losartan (COZAAR) 100 MG tablet Take 100 mg by mouth daily. 11/19/19  Yes [provider]  pantoprazole (PROTONIX) 40 MG tablet TAKE 1 TABLET BY MOUTH EVERY DAY 04/18/23  Yes Amando Chaput, Carie Caddy, MD  methocarbamol (ROBAXIN) 500 MG tablet TAKE 1 TABLET BY MOUTH EACH DAY AS NEEDED FOR MUSCLE SPASMS 05/26/23   Pollyann Savoy, MD  methotrexate 50 MG/2ML injection Inject 0.6 mLs (15 mg total) into the skin once a week. 10/09/22   Gearldine Bienenstock, PA-C  Methotrexate Sodium (METHOTREXATE, PF,) 50 MG/2ML injection *PF* INJECT 0.6ML UNDER THE SKIN ONCE PER WEEK AS DIRECTED 07/02/23   Gearldine Bienenstock, PA-C  TUBERCULIN SYR 1CC/27GX1/2" (B-D TB SYRINGE 1CC/27GX1/2") 27G X 1/2" 1 ML MISC 12 Syringes by Does not apply route once a week. 10/09/22   Gearldine Bienenstock, PA-C  Vedolizumab (ENTYVIO IV) Inject into the vein every 8 (eight) weeks.    [provider]    Current Outpatient Medications  Medication Sig Dispense Refill   allopurinol (ZYLOPRIM) 300 MG tablet TAKE 1 TABLET BY MOUTH EVERY DAY 90 tablet 0   DULoxetine (CYMBALTA) 30 MG capsule Take 1 capsule (30 mg total)  by mouth daily. 30 capsule 1   folic acid (FOLVITE) 1 MG tablet Take 1 tablet (1 mg total) by mouth daily. 90 tablet 0   losartan (COZAAR) 100 MG tablet Take 100 mg by mouth daily.     pantoprazole (PROTONIX) 40 MG tablet TAKE 1 TABLET BY MOUTH EVERY DAY 90 tablet 0   methocarbamol (ROBAXIN) 500 MG tablet TAKE 1 TABLET BY MOUTH EACH DAY AS NEEDED FOR MUSCLE SPASMS 30 tablet 0   methotrexate 50 MG/2ML injection Inject 0.6 mLs (15 mg total) into the skin once a week. 8 mL 0   Methotrexate Sodium (METHOTREXATE, PF,) 50 MG/2ML injection *PF* INJECT 0.6ML UNDER THE SKIN ONCE PER WEEK AS DIRECTED 8 mL 2   TUBERCULIN SYR 1CC/27GX1/2" (B-D TB SYRINGE 1CC/27GX1/2") 27G X 1/2" 1 ML MISC 12 Syringes by Does not apply route once a week. 12 each 3   Vedolizumab  (ENTYVIO IV) Inject into the vein every 8 (eight) weeks.     Current Facility-Administered Medications  Medication Dose Route Frequency Provider Last Rate Last Admin   0.9 %  sodium chloride infusion  500 mL Intravenous Once Dearius Hoffmann, Carie Caddy, MD        Allergies as of 09/02/2023   (No Known Allergies)    Family History  Problem Relation Age of Onset   Alzheimer's disease Father    Stroke Mother    Cancer Mother        liver & breast    Breast cancer Mother    Liver cancer Mother    Rheum arthritis Brother    Rheum arthritis Brother    Rheum arthritis Sister    Rheum arthritis Sister    Breast cancer Sister    Rheum arthritis Sister    Breast cancer Sister    Colon cancer Neg Hx    Esophageal cancer Neg Hx    Stomach cancer Neg Hx    Rectal cancer Neg Hx    Colon polyps Neg Hx     Social History   Socioeconomic History   Marital status: Married    Spouse name: Not on file   Number of children: 3   Years of education: Not on file   Highest education level: Not on file  Occupational History   Occupation: Press photographer: [employer]    Comment: retired  Tobacco Use   Smoking status: Former    Current packs/day: 0.00    Types: Cigarettes    Quit date: 07/15/1990    Years since quitting: 33.1    Passive exposure: Never   Smokeless tobacco: Former    Types: Engineer, drilling   Vaping status: Never Used  Substance and Sexual Activity   Alcohol use: Yes    Comment: social   Drug use: No   Sexual activity: Not on file  Other Topics Concern   Not on file  Social History Narrative   1 caffeine drink daily       2 sons and a step daughter   Social Drivers of Corporate investment banker Strain: Not on file  Food Insecurity: Not on file  Transportation Needs: Not on file  Physical Activity: Not on file  Stress: Not on file  Social Connections: Not on file  Intimate Partner Violence: Not on file    Physical Exam: Vital signs in last 24  hours: @BP  (!) 144/78   Pulse 66   Temp 97.8 F (36.6 C)   Ht  5\' 11"  (1.803 m)   Wt 226 lb (102.5 kg)   SpO2 98%   BMI 31.52 kg/m  GEN: NAD EYE: Sclerae anicteric ENT: MMM CV: Non-tachycardic Pulm: CTA b/l GI: Soft, NT/ND NEURO:  Alert & Oriented x 3   Erick Blinks, MD [facility]  09/02/2023 9:31 AM

## 2023-09-03 ENCOUNTER — Telehealth: Payer: Self-pay

## 2023-09-03 NOTE — Telephone Encounter (Signed)
  Follow up Call-     09/02/2023    8:35 AM 08/24/2021    1:29 PM  Call back number  Post procedure Call Back phone  # [phone]  Permission to leave phone message Yes Yes     Patient questions:  Do you have a fever, pain , or abdominal swelling? No. Pain Score  0 *  Have you tolerated food without any problems? Yes.    Have you been able to return to your normal activities? Yes.    Do you have any questions about your discharge instructions: Diet   No. Medications  No. Follow up visit  No.  Do you have questions or concerns about your Care? No.  Actions: * If pain score is 4 or above: No action needed, pain <4.

## 2023-09-04 DIAGNOSIS — I10 Essential (primary) hypertension: Secondary | ICD-10-CM | POA: Diagnosis not present

## 2023-09-04 DIAGNOSIS — R7303 Prediabetes: Secondary | ICD-10-CM | POA: Diagnosis not present

## 2023-09-04 DIAGNOSIS — Z79899 Other long term (current) drug therapy: Secondary | ICD-10-CM | POA: Diagnosis not present

## 2023-09-05 ENCOUNTER — Ambulatory Visit (HOSPITAL_BASED_OUTPATIENT_CLINIC_OR_DEPARTMENT_OTHER): Payer: Medicare PPO | Admitting: Physical Therapy

## 2023-09-05 ENCOUNTER — Encounter (HOSPITAL_BASED_OUTPATIENT_CLINIC_OR_DEPARTMENT_OTHER): Payer: Self-pay | Admitting: Physical Therapy

## 2023-09-05 DIAGNOSIS — R2689 Other abnormalities of gait and mobility: Secondary | ICD-10-CM | POA: Diagnosis not present

## 2023-09-05 DIAGNOSIS — M25511 Pain in right shoulder: Secondary | ICD-10-CM | POA: Diagnosis not present

## 2023-09-05 DIAGNOSIS — M5459 Other low back pain: Secondary | ICD-10-CM

## 2023-09-05 DIAGNOSIS — G8929 Other chronic pain: Secondary | ICD-10-CM | POA: Diagnosis not present

## 2023-09-05 DIAGNOSIS — M25512 Pain in left shoulder: Secondary | ICD-10-CM | POA: Diagnosis not present

## 2023-09-05 DIAGNOSIS — R531 Weakness: Secondary | ICD-10-CM | POA: Diagnosis not present

## 2023-09-05 LAB — SURGICAL PATHOLOGY

## 2023-09-05 LAB — LAB REPORT - SCANNED
A1c: 6.4
EGFR: 93

## 2023-09-05 NOTE — Therapy (Signed)
OUTPATIENT PHYSICAL THERAPY THORACOLUMBAR TREATMENT   Patient Name: Christopher Smith MRN: [id number] DOB:Sep 22, 1950, 73 y.o., male Today's Date: 09/05/2023  END OF SESSION:      Past Medical History:  Diagnosis Date   Allergy    Anal fissure    Arthritis    RA   Blood transfusion without reported diagnosis    COVID-19 06/2020   Diabetes mellitus    no meds taken 05-01-16- elevated glucose was from [facility] for UC   Diarrhea    Fatty liver    GERD (gastroesophageal reflux disease)    Gout    Hiatal hernia    Hypertension    Internal hemorrhoids    Polyarthritis    PONV (postoperative nausea and vomiting)    Sessile rectal polyp 03/27/2011   Sinusitis    Tuberculosis    positive PPD- had an allergic reaction to the PPD, gets yearly chest xray   Tubular adenoma of colon    Ulcerative colitis 01/24/2011   Past Surgical History:  Procedure Laterality Date   CHOLECYSTECTOMY     COLONOSCOPY     KNEE SURGERY     Bilateral    POLYPECTOMY     SHOULDER SURGERY Bilateral    Bilateral - rotator cuff repair    TOTAL KNEE ARTHROPLASTY Bilateral    Patient Active Problem List   Diagnosis Date Noted   Open fracture of one or more phalanges of hand 11/21/2017   Chronic inflammatory arthritis 08/05/2016   High risk medication use 08/05/2016   Contracture of elbow, right 08/05/2016   History of total knee replacement, bilateral 08/05/2016   Idiopathic chronic gout 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 tophus 08/05/2016   History of bilateral rotator cuff tear repair 08/05/2016   Gastroesophageal reflux  08/05/2016   Latent tuberculosis by blood test 08/05/2016   Ulcerative colitis (HCC) 09/03/2011   Glucose intolerance (impaired glucose tolerance) 09/03/2011   Severe diarrhea 09/03/2011   URI (upper respiratory infection) 07/02/2011   Bile salt-induced diarrhea 07/02/2011   S/P cholecystectomy 07/02/2011   Ulcerative colitis (HCC) 04/03/2011   Benign neoplasm of colon 04/03/2011   Positive  TB test 03/22/2011   Insomnia due to medical condition 03/22/2011   UC (ulcerative colitis) (HCC) 03/01/2011   Leg cramps 03/01/2011   Ulcerative colitis with rectal bleeding (HCC) 01/24/2011   Nonspecific abnormal finding in stool contents 01/24/2011   Heme positive stool 01/11/2011   Diarrhea 01/11/2011   Arthritis associated with inflammatory bowel disease 01/11/2011   Intentional weight loss 01/11/2011    PCP: Irena Reichmann MD  REFERRING PROVIDER: Erick Colace, MD   REFERRING DIAG:  M53.3 (ICD-10-CM) - Sacroiliac joint disease  M47.817 (ICD-10-CM) - Lumbosacral spondylosis without myelopathy    Rationale for Evaluation and Treatment: Rehabilitation  THERAPY DIAG:  No diagnosis found.  ONSET DATE: >31yr  SUBJECTIVE:  SUBJECTIVE STATEMENT:  The neck feels pretty good this morning. He is having a little pain in the low back.   POOL ACCESS:  Member of [employer], but has not attended.     Initial subjective I have had bilat shoulder surgeries and TKEs. (X 18 yrs).  I am a busy guy, maybe my lifestyle may be what's causing some of my pain.  Pain everywhere.  I am on a new med that has helped a lot reduced pain intitally by 75 % now seems to be ~ 50%.  I've had falls in the past. Pain areas (shoulders, hands, feet, LB). Interrupts sleep.  Better since the new med.  PERTINENT HISTORY:  Chronic low back pain pain generator most likely lumbar facet joints on the right side. Pain is with extension greater than with flexion   PAIN:  Are you having pain? yes : NPRS scale: current 1/10;  Pain location: Lt neck/ upper trap Pain description: ache Aggravating factors: turning head to L Relieving factors: sitting  PRECAUTIONS: None  RED FLAGS: None   WEIGHT BEARING RESTRICTIONS:  No  FALLS:  Has patient fallen in last 6 months? No  LIVING ENVIRONMENT: Lives with: lives with their spouse Lives in: House/apartment Stairs: No Has following equipment at home: none   OCCUPATION: retired Advice worker  PLOF: Independent  PATIENT GOALS: build strength, less pain, better balance, get my pants on easier  NEXT MD VISIT: next week  OBJECTIVE:  Note: Objective measures were completed at Evaluation unless otherwise noted.  DIAGNOSTIC FINDINGS:  X-rays of the lumbar spine were consistent with degenerative disc disease and facet joint arthropathy.  X-rays of the pelvis were unremarkable   PATIENT SURVEYS:  FOTO Risk adjusted 43%; Primary measure 61% with goal of 61%  COGNITION: Overall cognitive status: Within functional limits for tasks assessed     SENSATION: WFL  MUSCLE LENGTH: Hamstrings: Ms shortened   POSTURE:  slight rounding of shoulders and forward head  PALPATION: Ms tightness throughout spine and post shoulder areas.  No tenderness   Shoulder ROM: 25 % limited with internal and external should rotation Shoulder strength: shoulder flex and abd 4/5   LUMBAR ROM:   AROM eval  Flexion FT to mid  calf P!  Extension Full P!  Right lateral flexion 50% limited P!  Left lateral flexion 50% limited P!  Right rotation   Left rotation    (Blank rows = not tested)  LOWER EXTREMITY strength:     Strength in lbs Right eval Left eval  Hip flexion 49.8 45.4  Hip extension    Hip abduction    Hip adduction 28.1 32.9  Hip internal rotation    Hip external rotation    Knee flexion    Knee extension 57.5 52.7  Ankle dorsiflexion    Ankle plantarflexion    Ankle inversion    Ankle eversion     (Blank rows = not tested)  LOWER EXTREMITY ROM:    Some limitation in hip ext and in/ex rotation due to muscle tightness  LUMBAR SPECIAL TESTS:  Slump test: Negative and SI Compression/distraction test: Negative  FUNCTIONAL TESTS:  5 times  sit to stand: 19.27 Timed up and go (TUG): 10.55 4 stage balance: passed 1&2.  Tandem stance 10s; SLS 7s  GAIT: Distance walked: 500 Assistive device utilized: None Level of assistance: Complete Independence Comments: Guarded UE, decreased arm swing  TREATMENT  2/21 Manual:  reviewed use of thera-cane for self trigger point release. Skilled  palpation of trigger points. Sub-occipital release. Gentle manual traction. Reviewed use of thera-cane.    There-ex: Gluteal stretch 3x20 sec  LTR x10    Self Care:  Reviewed and updated HEP   Nuro-Re-ed          LF row with TA breathing 3x10 25 lbs  Hip abduction with TA breathing 3x15 55 lbs    09/01/23 Pt seen for aquatic therapy today.  Treatment took place in water 3.5-4.75 ft in depth at the [facility] pool. Temp of water was 91.  Pt entered/exited the pool via stairs using step-through pattern with  hand rail.   *walking 4.2 ft forward and backward with reciprocal arm swing 3 laps *Farmers carry with yellow hand floats, cues for TrA hold throughout, marching forward and backward *side stepping with arm addct/ abdct with yellow hand floats x 4 laps *TrA set/noodle press solid noodle to thighs in staggered stance x10 each LE forward * TrA set with knee taps to rainbow hand floats while marching forward backward (same side/ opposite side) * marching with alternating row motion with rainbow hand floats  *L stretch with UE on wall x 20s x 2, 2nd rep with gentle wag the tail * STS from 3rd step in water with forward arm reach, controlled descent, cues for core engagement and to breathe through motion * return to walking forward and backward in 4+ ft of water *SLS in 4+ ft of water, arms out to side x 20s each  * R/L hamstring stretch with foot on 3rd step x 20s each    Last Land visit:  Manual:  reviewed use of thera-cane for self trigger point release. Skilled palpation of trigger points. Sub-occipital release. Gentle  manual traction. Reviewed use of thera-cane.    There-ex: Upper trap stretch 2x20 sec  Levator stretch 2x20 sec Pain free cervical rotation 3x3    Self Care:  Established HEP; reviewed where to buy thera-cane   Nuro-Re-ed            Row green 2x12 reviewed RPE RPE of 6 with green  Shoulder extension 2x12 green  with abdominal breathing for core strength and postrue       PATIENT EDUCATION:  Education details: exercise rationale, technique, modifications   Person educated: Patient Education method: Explanation Education comprehension: verbalized understanding  HOME EXERCISE PROGRAM: Access Code: [email or web address] URL: [email or web address] Date: 08/20/2023 Prepared by: [facility] - [address]  Exercises - Shoulder Extension with Resistance  - 3 x weekly - 1-2 sets - 10 reps - Standing 'L' Stretch at Counter  - 2 x daily - 7 x weekly - 2 reps - 10 seconds hold - Seated Hamstring Stretch  - 2 x daily - 7 x weekly - 2-3 reps - 15-20 hold - Seated Figure 4 Piriformis Stretch  - 2 x daily - 7 x weekly - 2 reps - 15-20 hold - Supine Lower Trunk Rotation  - 2 x daily - 7 x weekly - 3-5 reps - 5-10 hold - Supine Transversus Abdominis Bracing - Hands on Thighs  - 1 x daily - 7 x weekly - 10 reps - 5 hold  Aquatic   Land HEP  Access Code: [email or web address] URL: [email or web address] Date: 09/05/2023 Prepared by: Lorayne Bender  Exercises - Theracane Over Shoulder  - 1 x daily - 7 x weekly - 3 sets - 10 reps - Seated Cervical Rotation AROM  - 2-3 x daily - 7 x weekly - 3 reps -  Gentle Levator Scapulae Stretch  - 1 x daily - 7 x weekly - 3 sets - 3 reps - 20 hold - Seated Upper Trapezius Stretch  - 1 x daily - 7 x weekly - 3 sets - 3 reps - 20 hold - Scapular Retraction with Resistance  - 1 x daily - 7 x weekly - 3 sets - 15 reps - Shoulder extension with resistance - Neutral  - 1 x daily - 7 x weekly - 3 sets - 15 reps - Supine Lower Trunk  Rotation  - 1 x daily - 7 x weekly - 3 sets - 10 reps - Supine Piriformis Stretch with Foot on Ground  - 1 x daily - 7 x weekly - 3 sets - 3 reps - 20 hold - Seated Bilateral Shoulder Flexion Towel Slide at Table Top  - 1 x daily - 7 x weekly - 3 sets - 10 reps - Standing Glute Med Mobilization with Small Ball on Wall  - 1 x daily - 7 x weekly - 3 sets - 10 reps  ASSESSMENT:  CLINICAL IMPRESSION: Patient tolerated treatment well.  The patient had more in his mid back and low back today.  We gave him some stretches and exercises to focus more on his mid and low back.  We also got him out into the gym today.  He plans on going to the [employer] at some point.  We talked about using RPE to grade his exercises.  Kept his weight at a low RPE today until he sees how he tolerates it.  We talked about exercise and she is not to do at the gym.  We reviewed self soft tissue mobilization with tennis ball for his mid low back.  We also reviewed stretching for his hips.  Therapy will continue to progress as tolerated. Initial Impression Patient is a 73 y.o. m who was seen today for physical therapy evaluation and treatment for LBP.  He reports generalized pain due to OA in most joints  for past 20 yrs.  Has had declined pain management up until this point as he was not interested in narcotics. Pt has begun using low does of Duloxetine for the past month and reports significant decrease in overall pain including his back (50-75%). He is an active senior and wants to remain capable of doing the things he likes to do. Functional and objective testing demonstrates decrease in hip and core strength, posterior core tightness and pain impeding movement all which limit his ADL's and mobility.  He will benefti from skilled PT both land based and aquatics to improve all areas of deficits  OBJECTIVE IMPAIRMENTS: decreased activity tolerance, decreased balance, decreased mobility, difficulty walking, decreased strength, impaired  flexibility, postural dysfunction, and pain.   ACTIVITY LIMITATIONS: bending, sitting, squatting, stairs, transfers, reach over head, and locomotion level  PARTICIPATION LIMITATIONS: community activity and yard work  PERSONAL FACTORS: Time since onset of injury/illness/exacerbation and 1-2 comorbidities: see PmHx  are also affecting patient's functional outcome.   REHAB POTENTIAL: Good  CLINICAL DECISION MAKING: Evolving/moderate complexity  EVALUATION COMPLEXITY: High   GOALS: Goals reviewed with patient? Yes  SHORT TERM GOALS: Target date: 08/25/23  Pt will tolerate full aquatic sessions consistently without increase in pain and with improving function to demonstrate good toleration and effectiveness of intervention.  Baseline: Goal status: Met 08/25/23  2.  Pt will improve on 5 X STS test to <or= 17s (MCID) to demonstrate improving functional lower extremity strength, transitional movements,  and balance Baseline: 19.27 Goal status: INITIAL  3.  Pt will complete SLS in 3.6 ft consistently @20s  or > Baseline: unable land based Goal status: In process 08/25/23  4.  Pt to report reduction in pain with aquatic intervention at <2/10 while submerged Baseline:  Goal status: Met 08/25/23  5.  Pt will have improve on Lumbar Side bending ROM by 50%. Baseline: see chart Goal status: In process 08/25/23    LONG TERM GOALS: Target date: 10/03/23  Pt to meet stated Foto Goal Baseline:  Goal status: INITIAL  2.  Pt will be indep with final HEP's (land and aquatic as appropriate) for continued management of condition Baseline:  Goal status: INITIAL  3.  Pt will improve strength in hips and shoulders by at least 10 lbs to demonstrate improved overall physical function Baseline: see chart.  Shoulder strength in lbs TBA Goal status: INITIAL  4.  Pt will complete tandem and SLS x 20s to demonstrate improvement in balance ability. Baseline:  Goal status: INITIAL  5.  Pt will report  "normal" pain to be 2 or less/10 for improve mobility Baseline: 4 Goal status: IN PROGRESS - 09/01/23  6.  Pt will report donning pants without difficulty Baseline: difficult due to muscle tightness in LE Goal status: INITIAL  PLAN:  PT FREQUENCY: 2x/week  PT DURATION: 8 weeks  PLANNED INTERVENTIONS: 97164- PT Re-evaluation, 97110-Therapeutic exercises, 97530- Therapeutic activity, 97112- Neuromuscular re-education, 97535- Self Care, 82956- Manual therapy, [id number]- Gait training, [phone]- Aquatic Therapy, 97014- Electrical stimulation (unattended), [phone]- Ionotophoresis 4mg /ml Dexamethasone, Patient/Family education, Balance training, Stair training, Taping, Dry Needling, Joint mobilization, DME instructions, Cryotherapy, and Moist heat.  PLAN FOR NEXT SESSION: Aquatics for core and LE stretching, pain reduction/management, balance retraining Land: UE/shoulder ROM and strengthening; LB and core strengthening; hip strengthening   Mayer Camel, PTA 09/05/23 8:34 AM [facility] [address] [place], [place], [address] Phone: [phone]   Fax:  [phone]      Referring diagnosis? LBP Treatment diagnosis? (if different than referring diagnosis) LBP AND shoulder pain What was this (referring dx) caused by? []  Surgery []  Fall [x]  Ongoing issue [x]  Arthritis []  Other: ____________  Laterality: []  Rt []  Lt [x]  Both  Check all possible CPT codes:  *CHOOSE 10 OR LESS*    See Planned Interventions listed in the Plan section of the Evaluation.

## 2023-09-08 ENCOUNTER — Encounter (HOSPITAL_BASED_OUTPATIENT_CLINIC_OR_DEPARTMENT_OTHER): Payer: Self-pay | Admitting: Physical Therapy

## 2023-09-08 ENCOUNTER — Ambulatory Visit (HOSPITAL_BASED_OUTPATIENT_CLINIC_OR_DEPARTMENT_OTHER): Payer: Medicare PPO | Admitting: Physical Therapy

## 2023-09-08 DIAGNOSIS — G8929 Other chronic pain: Secondary | ICD-10-CM | POA: Diagnosis not present

## 2023-09-08 DIAGNOSIS — M5459 Other low back pain: Secondary | ICD-10-CM | POA: Diagnosis not present

## 2023-09-08 DIAGNOSIS — R2689 Other abnormalities of gait and mobility: Secondary | ICD-10-CM | POA: Diagnosis not present

## 2023-09-08 DIAGNOSIS — M25512 Pain in left shoulder: Secondary | ICD-10-CM | POA: Diagnosis not present

## 2023-09-08 DIAGNOSIS — R531 Weakness: Secondary | ICD-10-CM | POA: Diagnosis not present

## 2023-09-08 DIAGNOSIS — M25511 Pain in right shoulder: Secondary | ICD-10-CM | POA: Diagnosis not present

## 2023-09-08 NOTE — Therapy (Signed)
 OUTPATIENT PHYSICAL THERAPY THORACOLUMBAR TREATMENT   Patient Name: Christopher Smith MRN: [id number] DOB:1950-10-16, 73 y.o., male Today's Date: 09/08/2023  END OF SESSION:  PT End of Session - 09/08/23 0855     Visit Number 8    Number of Visits 16    Date for PT Re-Evaluation 10/03/23    Authorization Type [employer]    Authorization Time Period 16 visits approved From 01.20.2025 - 03.21.2025 Authorization #[id number]    PT Start Time 0846    PT Stop Time 0925    PT Time Calculation (min) 39 min    Activity Tolerance Patient tolerated treatment well    Behavior During Therapy [facility] for tasks assessed/performed                Past Medical History:  Diagnosis Date   Allergy    Anal fissure    Arthritis    RA   Blood transfusion without reported diagnosis    COVID-19 06/2020   Diabetes mellitus    no meds taken 05-01-16- elevated glucose was from [facility] for UC   Diarrhea    Fatty liver    GERD (gastroesophageal reflux disease)    Gout    Hiatal hernia    Hypertension    Internal hemorrhoids    Polyarthritis    PONV (postoperative nausea and vomiting)    Sessile rectal polyp 03/27/2011   Sinusitis    Tuberculosis    positive PPD- had an allergic reaction to the PPD, gets yearly chest xray   Tubular adenoma of colon    Ulcerative colitis 01/24/2011   Past Surgical History:  Procedure Laterality Date   CHOLECYSTECTOMY     COLONOSCOPY     KNEE SURGERY     Bilateral    POLYPECTOMY     SHOULDER SURGERY Bilateral    Bilateral - rotator cuff repair    TOTAL KNEE ARTHROPLASTY Bilateral    Patient Active Problem List   Diagnosis Date Noted   Open fracture of one or more phalanges of hand 11/21/2017   Chronic inflammatory arthritis 08/05/2016   High risk medication use 08/05/2016   Contracture of elbow, right 08/05/2016   History of total knee replacement, bilateral 08/05/2016   Idiopathic chronic gout 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 tophus 08/05/2016   History of  bilateral rotator cuff tear repair 08/05/2016   Gastroesophageal reflux  08/05/2016   Latent tuberculosis by blood test 08/05/2016   Ulcerative colitis (HCC) 09/03/2011   Glucose intolerance (impaired glucose tolerance) 09/03/2011   Severe diarrhea 09/03/2011   URI (upper respiratory infection) 07/02/2011   Bile salt-induced diarrhea 07/02/2011   S/P cholecystectomy 07/02/2011   Ulcerative colitis (HCC) 04/03/2011   Benign neoplasm of colon 04/03/2011   Positive TB test 03/22/2011   Insomnia due to medical condition 03/22/2011   UC (ulcerative colitis) (HCC) 03/01/2011   Leg cramps 03/01/2011   Ulcerative colitis with rectal bleeding (HCC) 01/24/2011   Nonspecific abnormal finding in stool contents 01/24/2011   Heme positive stool 01/11/2011   Diarrhea 01/11/2011   Arthritis associated with inflammatory bowel disease 01/11/2011   Intentional weight loss 01/11/2011    PCP: Irena Reichmann MD  REFERRING PROVIDER: Erick Colace, MD   REFERRING DIAG:  M53.3 (ICD-10-CM) - Sacroiliac joint disease  M47.817 (ICD-10-CM) - Lumbosacral spondylosis without myelopathy    Rationale for Evaluation and Treatment: Rehabilitation  THERAPY DIAG:  Other low back pain  General weakness  Other abnormalities of gait and mobility  Chronic pain of both shoulders  ONSET DATE: >33yr  SUBJECTIVE:                                                                                                                                                                                           SUBJECTIVE STATEMENT:  "Neck is the worst but only 2/10. LB no pain just know its there."  POOL ACCESS:  Member of [employer], but has not attended.     Initial subjective I have had bilat shoulder surgeries and TKEs. (X 18 yrs).  I am a busy guy, maybe my lifestyle may be what's causing some of my pain.  Pain everywhere.  I am on a new med that has helped a lot reduced pain intitally by 75 % now seems to be  ~ 50%.  I've had falls in the past. Pain areas (shoulders, hands, feet, LB). Interrupts sleep.  Better since the new med.  PERTINENT HISTORY:  Chronic low back pain pain generator most likely lumbar facet joints on the right side. Pain is with extension greater than with flexion   PAIN:  Are you having pain? yes : NPRS scale: current 2/10;  Pain location: Lt neck/ upper trap Pain description: ache Aggravating factors: turning head to L Relieving factors: sitting  PRECAUTIONS: None  RED FLAGS: None   WEIGHT BEARING RESTRICTIONS: No  FALLS:  Has patient fallen in last 6 months? No  LIVING ENVIRONMENT: Lives with: lives with their spouse Lives in: House/apartment Stairs: No Has following equipment at home: none   OCCUPATION: retired Advice worker  PLOF: Independent  PATIENT GOALS: build strength, less pain, better balance, get my pants on easier  NEXT MD VISIT: next week  OBJECTIVE:  Note: Objective measures were completed at Evaluation unless otherwise noted.  DIAGNOSTIC FINDINGS:  X-rays of the lumbar spine were consistent with degenerative disc disease and facet joint arthropathy.  X-rays of the pelvis were unremarkable   PATIENT SURVEYS:  FOTO Risk adjusted 43%; Primary measure 61% with goal of 61% 09/08/23: 72%  COGNITION: Overall cognitive status: Within functional limits for tasks assessed     SENSATION: WFL  MUSCLE LENGTH: Hamstrings: Ms shortened   POSTURE:  slight rounding of shoulders and forward head  PALPATION: Ms tightness throughout spine and post shoulder areas.  No tenderness   Shoulder ROM: 25 % limited with internal and external should rotation Shoulder strength: shoulder flex and abd 4/5   LUMBAR ROM:   AROM eval  Flexion FT to mid  calf P!  Extension Full P!  Right lateral flexion 50% limited P!  Left lateral flexion 50% limited P!  Right rotation   Left  rotation    (Blank rows = not tested)  LOWER EXTREMITY strength:      Strength in lbs Right eval Left eval  Hip flexion 49.8 45.4  Hip extension    Hip abduction    Hip adduction 28.1 32.9  Hip internal rotation    Hip external rotation    Knee flexion    Knee extension 57.5 52.7  Ankle dorsiflexion    Ankle plantarflexion    Ankle inversion    Ankle eversion     (Blank rows = not tested)  LOWER EXTREMITY ROM:    Some limitation in hip ext and in/ex rotation due to muscle tightness  LUMBAR SPECIAL TESTS:  Slump test: Negative and SI Compression/distraction test: Negative  FUNCTIONAL TESTS:  5 times sit to stand: 19.27 From pool bench Timed up and go (TUG): 10.55 4 stage balance: passed 1&2.  Tandem stance 10s; SLS 7s  09/08/23: 5 STS 11.80  GAIT: Distance walked: 500 Assistive device utilized: None Level of assistance: Complete Independence Comments: Guarded UE, decreased arm swing  TREATMENT  09/08/23  Foto completed 5 x STS test completed  Pt seen for aquatic therapy today.  Treatment took place in water 3.5-4.75 ft in depth at the [facility] pool. Temp of water was 91.  Pt entered/exited the pool via stairs using step-through pattern with  hand rail.   *walking 4.2 ft forward and backward with reciprocal arm swing *Farmers carry with yellow hand floats, cues for TrA hold throughout, marching forward and backward *side stepping with arm addct/ abdct with yellow hand floats x 4 laps *STS from 3rd step unsupported x 8 *adductor sets using orange BB x 5 10s hold *STS with add set x 5 *TrA set/noodle press solid noodle->yellow noodle to thighs in staggered stance x10 each LE forward *oblique set R and Left x 5 20s hold *L stretch with UE on wall x 20s x 2, 3rd and 4th rep with gentle wag the tail  Self care: Instruction on modifying manual activities at home (lifting wood into splitter, mowing grass) Techniques to improve hydration due to ms cramping.  Avoid caffeine; encouraging increased oz of water intake.   Last  land: 2/21 Manual:  reviewed use of thera-cane for self trigger point release. Skilled palpation of trigger points. Sub-occipital release. Gentle manual traction. Reviewed use of thera-cane.    There-ex: Gluteal stretch 3x20 sec  LTR x10    Self Care:  Reviewed and updated HEP   Nuro-Re-ed          LF row with TA breathing 3x10 25 lbs  Hip abduction with TA breathing 3x15 55 lbs     PATIENT EDUCATION:  Education details: exercise rationale, technique, modifications   Person educated: Patient Education method: Explanation Education comprehension: verbalized understanding  HOME EXERCISE PROGRAM: Access Code: [email or web address] URL: [email or web address] Date: 08/20/2023 Prepared by: [facility] [address]  Exercises - Shoulder Extension with Resistance  - 3 x weekly - 1-2 sets - 10 reps - Standing 'L' Stretch at Counter  - 2 x daily - 7 x weekly - 2 reps - 10 seconds hold - Seated Hamstring Stretch  - 2 x daily - 7 x weekly - 2-3 reps - 15-20 hold - Seated Figure 4 Piriformis Stretch  - 2 x daily - 7 x weekly - 2 reps - 15-20 hold - Supine Lower Trunk Rotation  - 2 x daily - 7 x weekly - 3-5 reps - 5-10 hold - Supine  Transversus Abdominis Bracing - Hands on Thighs  - 1 x daily - 7 x weekly - 10 reps - 5 hold  Aquatic   Land HEP  Access Code: [email or web address] URL: [email or web address] Date: 09/05/2023 Prepared by: Lorayne Bender  Exercises - Theracane Over Shoulder  - 1 x daily - 7 x weekly - 3 sets - 10 reps - Seated Cervical Rotation AROM  - 2-3 x daily - 7 x weekly - 3 reps - Gentle Levator Scapulae Stretch  - 1 x daily - 7 x weekly - 3 sets - 3 reps - 20 hold - Seated Upper Trapezius Stretch  - 1 x daily - 7 x weekly - 3 sets - 3 reps - 20 hold - Scapular Retraction with Resistance  - 1 x daily - 7 x weekly - 3 sets - 15 reps - Shoulder extension with resistance - Neutral  - 1 x daily - 7 x weekly - 3 sets - 15 reps - Supine Lower  Trunk Rotation  - 1 x daily - 7 x weekly - 3 sets - 10 reps - Supine Piriformis Stretch with Foot on Ground  - 1 x daily - 7 x weekly - 3 sets - 3 reps - 20 hold - Seated Bilateral Shoulder Flexion Towel Slide at Table Top  - 1 x daily - 7 x weekly - 3 sets - 10 reps - Standing Glute Med Mobilization with Small Ball on Wall  - 1 x daily - 7 x weekly - 3 sets - 10 reps  ASSESSMENT: Pt making good progress with therapy.  He has met his Foto goal and 5x STS goal. He reports reduction in overall pain and ability to return to some of his home activities.  He is given instruction on weaning back into outdoor activities with suggestion on modifications and ensuring use of good body mechanics.  He is pleased with his progress. Good toleration to increased resistance with core engagement.  No pain sensitivity throughout session.    Initial Impression Patient is a 73 y.o. m who was seen today for physical therapy evaluation and treatment for LBP.  He reports generalized pain due to OA in most joints  for past 20 yrs.  Has had declined pain management up until this point as he was not interested in narcotics. Pt has begun using low does of Duloxetine for the past month and reports significant decrease in overall pain including his back (50-75%). He is an active senior and wants to remain capable of doing the things he likes to do. Functional and objective testing demonstrates decrease in hip and core strength, posterior core tightness and pain impeding movement all which limit his ADL's and mobility.  He will benefti from skilled PT both land based and aquatics to improve all areas of deficits  OBJECTIVE IMPAIRMENTS: decreased activity tolerance, decreased balance, decreased mobility, difficulty walking, decreased strength, impaired flexibility, postural dysfunction, and pain.   ACTIVITY LIMITATIONS: bending, sitting, squatting, stairs, transfers, reach over head, and locomotion level  PARTICIPATION  LIMITATIONS: community activity and yard work  PERSONAL FACTORS: Time since onset of injury/illness/exacerbation and 1-2 comorbidities: see PmHx  are also affecting patient's functional outcome.   REHAB POTENTIAL: Good  CLINICAL DECISION MAKING: Evolving/moderate complexity  EVALUATION COMPLEXITY: High   GOALS: Goals reviewed with patient? Yes  SHORT TERM GOALS: Target date: 08/25/23  Pt will tolerate full aquatic sessions consistently without increase in pain and with improving function to demonstrate good toleration and effectiveness of intervention.  Baseline: Goal status: Met 08/25/23  2.  Pt will improve on 5 X STS test to <or= 17s (MCID) to demonstrate improving functional lower extremity strength, transitional movements, and balance Baseline: 19.27 Goal status: Met 09/08/23  3.  Pt will complete SLS in 3.6 ft consistently @20s  or > Baseline: unable land based Goal status: In process 08/25/23  4.  Pt to report reduction in pain with aquatic intervention at <2/10 while submerged Baseline:  Goal status: Met 08/25/23  5.  Pt will have improve on Lumbar Side bending ROM by 50%. Baseline: see chart Goal status: In process 08/25/23    LONG TERM GOALS: Target date: 10/03/23  Pt to meet stated Foto Goal Baseline:  Goal status: Met 09/09/23  2.  Pt will be indep with final HEP's (land and aquatic as appropriate) for continued management of condition Baseline:  Goal status: INITIAL  3.  Pt will improve strength in hips and shoulders by at least 10 lbs to demonstrate improved overall physical function Baseline: see chart.  Shoulder strength in lbs TBA Goal status: INITIAL  4.  Pt will complete tandem and SLS x 20s to demonstrate improvement in balance ability. Baseline:  Goal status: INITIAL  5.  Pt will report "normal" pain to be 2 or less/10 for improve mobility Baseline: 4 Goal status: IN PROGRESS - 09/01/23  6.  Pt will report donning pants without  difficulty Baseline: difficult due to muscle tightness in LE Goal status: INITIAL  PLAN:  PT FREQUENCY: 2x/week  PT DURATION: 8 weeks  PLANNED INTERVENTIONS: 97164- PT Re-evaluation, 97110-Therapeutic exercises, 97530- Therapeutic activity, 97112- Neuromuscular re-education, 97535- Self Care, 16109- Manual therapy, [id number]- Gait training, [phone]- Aquatic Therapy, 97014- Electrical stimulation (unattended), [id number]- Ionotophoresis 4mg /ml Dexamethasone, Patient/Family education, Balance training, Stair training, Taping, Dry Needling, Joint mobilization, DME instructions, Cryotherapy, and Moist heat.  PLAN FOR NEXT SESSION: Aquatics for core and LE stretching, pain reduction/management, balance retraining Land: UE/shoulder ROM and strengthening; LB and core strengthening; hip strengthening   Rushie Chestnut) Jquan Egelston MPT 09/08/23 9:37 AM [facility] [address] [place], [place], [address] Phone: [phone]   Fax:  [phone]       Referring diagnosis? LBP Treatment diagnosis? (if different than referring diagnosis) LBP AND shoulder pain What was this (referring dx) caused by? []  Surgery []  Fall [x]  Ongoing issue [x]  Arthritis []  Other: ____________  Laterality: []  Rt []  Lt [x]  Both  Check all possible CPT codes:  *CHOOSE 10 OR LESS*    See Planned Interventions listed in the Plan section of the Evaluation.

## 2023-09-10 ENCOUNTER — Ambulatory Visit (HOSPITAL_BASED_OUTPATIENT_CLINIC_OR_DEPARTMENT_OTHER): Payer: Medicare PPO | Admitting: Physical Therapy

## 2023-09-10 DIAGNOSIS — G8929 Other chronic pain: Secondary | ICD-10-CM

## 2023-09-10 DIAGNOSIS — M25512 Pain in left shoulder: Secondary | ICD-10-CM | POA: Diagnosis not present

## 2023-09-10 DIAGNOSIS — M5459 Other low back pain: Secondary | ICD-10-CM | POA: Diagnosis not present

## 2023-09-10 DIAGNOSIS — M25511 Pain in right shoulder: Secondary | ICD-10-CM | POA: Diagnosis not present

## 2023-09-10 DIAGNOSIS — R531 Weakness: Secondary | ICD-10-CM

## 2023-09-10 DIAGNOSIS — R2689 Other abnormalities of gait and mobility: Secondary | ICD-10-CM | POA: Diagnosis not present

## 2023-09-10 NOTE — Therapy (Signed)
 OUTPATIENT PHYSICAL THERAPY THORACOLUMBAR TREATMENT   Patient Name: Christopher Smith MRN: [id number] DOB:Aug 04, 1950, 73 y.o., male Today's Date: 09/10/2023  END OF SESSION:  PT End of Session - 09/10/23 0833     Visit Number 9    Number of Visits 16    Date for PT Re-Evaluation 10/03/23    Authorization Type [employer]    Authorization Time Period 16 visits approved From 01.20.2025 - 03.21.2025 Authorization #[id number]    Authorization - Visit Number 9    Authorization - Number of Visits 16    Progress Note Due on Visit 10    PT Start Time 0840    PT Stop Time 0920    PT Time Calculation (min) 40 min    Activity Tolerance Patient tolerated treatment well    Behavior During Therapy [facility] for tasks assessed/performed                 Past Medical History:  Diagnosis Date   Allergy    Anal fissure    Arthritis    RA   Blood transfusion without reported diagnosis    COVID-19 06/2020   Diabetes mellitus    no meds taken 05-01-16- elevated glucose was from predisone for UC   Diarrhea    Fatty liver    GERD (gastroesophageal reflux disease)    Gout    Hiatal hernia    Hypertension    Internal hemorrhoids    Polyarthritis    PONV (postoperative nausea and vomiting)    Sessile rectal polyp 03/27/2011   Sinusitis    Tuberculosis    positive PPD- had an allergic reaction to the PPD, gets yearly chest xray   Tubular adenoma of colon    Ulcerative colitis 01/24/2011   Past Surgical History:  Procedure Laterality Date   CHOLECYSTECTOMY     COLONOSCOPY     KNEE SURGERY     Bilateral    POLYPECTOMY     SHOULDER SURGERY Bilateral    Bilateral - rotator cuff repair    TOTAL KNEE ARTHROPLASTY Bilateral    Patient Active Problem List   Diagnosis Date Noted   Open fracture of one or more phalanges of hand 11/21/2017   Chronic inflammatory arthritis 08/05/2016   High risk medication use 08/05/2016   Contracture of elbow, right 08/05/2016   History of total knee  replacement, bilateral 08/05/2016   Idiopathic chronic gout 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 tophus 08/05/2016   History of bilateral rotator cuff tear repair 08/05/2016   Gastroesophageal reflux  08/05/2016   Latent tuberculosis by blood test 08/05/2016   Ulcerative colitis (HCC) 09/03/2011   Glucose intolerance (impaired glucose tolerance) 09/03/2011   Severe diarrhea 09/03/2011   URI (upper respiratory infection) 07/02/2011   Bile salt-induced diarrhea 07/02/2011   S/P cholecystectomy 07/02/2011   Ulcerative colitis (HCC) 04/03/2011   Benign neoplasm of colon 04/03/2011   Positive TB test 03/22/2011   Insomnia due to medical condition 03/22/2011   UC (ulcerative colitis) (HCC) 03/01/2011   Leg cramps 03/01/2011   Ulcerative colitis with rectal bleeding (HCC) 01/24/2011   Nonspecific abnormal finding in stool contents 01/24/2011   Heme positive stool 01/11/2011   Diarrhea 01/11/2011   Arthritis associated with inflammatory bowel disease 01/11/2011   Intentional weight loss 01/11/2011    PCP: Irena Reichmann MD  REFERRING PROVIDER: Erick Colace, MD   REFERRING DIAG:  M53.3 (ICD-10-CM) - Sacroiliac joint disease  M47.817 (ICD-10-CM) - Lumbosacral spondylosis without myelopathy    Rationale for Evaluation  and Treatment: Rehabilitation  THERAPY DIAG:  Other low back pain  General weakness  Chronic pain of both shoulders  Other abnormalities of gait and mobility  ONSET DATE: >39yr  SUBJECTIVE:                                                                                                                                                                                           SUBJECTIVE STATEMENT:  "I can feel it is there, but there is very minimal pain. I can live with this".   POOL ACCESS:  Member of [employer], but has not attended.     Initial subjective I have had bilat shoulder surgeries and TKEs. (X 18 yrs).  I am a busy guy, maybe my lifestyle may be what's causing  some of my pain.  Pain everywhere.  I am on a new med that has helped a lot reduced pain intitally by 75 % now seems to be ~ 50%.  I've had falls in the past. Pain areas (shoulders, hands, feet, LB). Interrupts sleep.  Better since the new med.  PERTINENT HISTORY:  Chronic low back pain pain generator most likely lumbar facet joints on the right side. Pain is with extension greater than with flexion   PAIN:  Are you having pain? yes : NPRS scale: current 2/10;  Pain location: Lt neck/ upper trap Pain description: ache Aggravating factors: turning head to L Relieving factors: sitting  PRECAUTIONS: None  RED FLAGS: None   WEIGHT BEARING RESTRICTIONS: No  FALLS:  Has patient fallen in last 6 months? No  LIVING ENVIRONMENT: Lives with: lives with their spouse Lives in: House/apartment Stairs: No Has following equipment at home: none   OCCUPATION: retired Advice worker  PLOF: Independent  PATIENT GOALS: build strength, less pain, better balance, get my pants on easier  NEXT MD VISIT: next week  OBJECTIVE:  Note: Objective measures were completed at Evaluation unless otherwise noted.  DIAGNOSTIC FINDINGS:  X-rays of the lumbar spine were consistent with degenerative disc disease and facet joint arthropathy.  X-rays of the pelvis were unremarkable   PATIENT SURVEYS:  FOTO Risk adjusted 43%; Primary measure 61% with goal of 61% 09/08/23: 72%  COGNITION: Overall cognitive status: Within functional limits for tasks assessed     SENSATION: WFL  MUSCLE LENGTH: Hamstrings: Ms shortened   POSTURE:  slight rounding of shoulders and forward head  PALPATION: Ms tightness throughout spine and post shoulder areas.  No tenderness   Shoulder ROM: 25 % limited with internal and external should rotation Shoulder strength: shoulder flex and abd 4/5   LUMBAR ROM:   AROM eval  Flexion FT to mid  calf P!  Extension Full P!  Right lateral flexion 50% limited P!  Left  lateral flexion 50% limited P!  Right rotation   Left rotation    (Blank rows = not tested)  LOWER EXTREMITY strength:     Strength in lbs Right eval Left eval  Hip flexion 49.8 45.4  Hip extension    Hip abduction    Hip adduction 28.1 32.9  Hip internal rotation    Hip external rotation    Knee flexion    Knee extension 57.5 52.7  Ankle dorsiflexion    Ankle plantarflexion    Ankle inversion    Ankle eversion     (Blank rows = not tested)  LOWER EXTREMITY ROM:    Some limitation in hip ext and in/ex rotation due to muscle tightness  LUMBAR SPECIAL TESTS:  Slump test: Negative and SI Compression/distraction test: Negative  FUNCTIONAL TESTS:  5 times sit to stand: 19.27 From pool bench Timed up and go (TUG): 10.55 4 stage balance: passed 1&2.  Tandem stance 10s; SLS 7s  09/08/23: 5 STS 11.80  GAIT: Distance walked: 500 Assistive device utilized: None Level of assistance: Complete Independence Comments: Guarded UE, decreased arm swing  TREATMENT  09/10/2023:  Manual:  Skilled palpation of trigger points in UT and rhomboids. Sub-occipital release. Gentle manual traction.     There-ex: Nustep lvl 5, 5 min with PT present for subjective.  Prone scaption Horizontal abduction with GTB   Self Care:  Reviewed and updated HEP   Nuro-Re-ed  LF row with TA breathing 3x10 25 lbs  Standing prone press out with rotation with focus on TA contraction and breathing with RTB x10, bilat    Foto completed 5 x STS test completed  Pt seen for aquatic therapy today.  Treatment took place in water 3.5-4.75 ft in depth at the [facility] pool. Temp of water was 91.  Pt entered/exited the pool via stairs using step-through pattern with  hand rail.   *walking 4.2 ft forward and backward with reciprocal arm swing *Farmers carry with yellow hand floats, cues for TrA hold throughout, marching forward and backward *side stepping with arm addct/ abdct with yellow hand  floats x 4 laps *STS from 3rd step unsupported x 8 *adductor sets using orange BB x 5 10s hold *STS with add set x 5 *TrA set/noodle press solid noodle->yellow noodle to thighs in staggered stance x10 each LE forward *oblique set R and Left x 5 20s hold *L stretch with UE on wall x 20s x 2, 3rd and 4th rep with gentle wag the tail  Self care: Instruction on modifying manual activities at home (lifting wood into splitter, mowing grass) Techniques to improve hydration due to ms cramping.  Avoid caffeine; encouraging increased oz of water intake.   Last land: 2/21 Manual:  reviewed use of thera-cane for self trigger point release. Skilled palpation of trigger points. Sub-occipital release. Gentle manual traction. Reviewed use of thera-cane.    There-ex: Gluteal stretch 3x20 sec  LTR x10    Self Care:  Reviewed and updated HEP   Nuro-Re-ed          LF row with TA breathing 3x10 25 lbs  Hip abduction with TA breathing 3x15 55 lbs     PATIENT EDUCATION:  Education details: exercise rationale, technique, modifications   Person educated: Patient Education method: Explanation Education comprehension: verbalized understanding  HOME EXERCISE PROGRAM: Access Code: [email or web address] URL: [email or web address] Date: 08/20/2023  Prepared by: [facility] - [address]  Exercises - Shoulder Extension with Resistance  - 3 x weekly - 1-2 sets - 10 reps - Standing 'L' Stretch at Counter  - 2 x daily - 7 x weekly - 2 reps - 10 seconds hold - Seated Hamstring Stretch  - 2 x daily - 7 x weekly - 2-3 reps - 15-20 hold - Seated Figure 4 Piriformis Stretch  - 2 x daily - 7 x weekly - 2 reps - 15-20 hold - Supine Lower Trunk Rotation  - 2 x daily - 7 x weekly - 3-5 reps - 5-10 hold - Supine Transversus Abdominis Bracing - Hands on Thighs  - 1 x daily - 7 x weekly - 10 reps - 5 hold  Aquatic   Land HEP  Access Code: [email or web address] URL:  [email or web address] Date: 09/05/2023 Prepared by: Lorayne Bender  Exercises - Theracane Over Shoulder  - 1 x daily - 7 x weekly - 3 sets - 10 reps - Seated Cervical Rotation AROM  - 2-3 x daily - 7 x weekly - 3 reps - Gentle Levator Scapulae Stretch  - 1 x daily - 7 x weekly - 3 sets - 3 reps - 20 hold - Seated Upper Trapezius Stretch  - 1 x daily - 7 x weekly - 3 sets - 3 reps - 20 hold - Scapular Retraction with Resistance  - 1 x daily - 7 x weekly - 3 sets - 15 reps - Shoulder extension with resistance - Neutral  - 1 x daily - 7 x weekly - 3 sets - 15 reps - Supine Lower Trunk Rotation  - 1 x daily - 7 x weekly - 3 sets - 10 reps - Supine Piriformis Stretch with Foot on Ground  - 1 x daily - 7 x weekly - 3 sets - 3 reps - 20 hold - Seated Bilateral Shoulder Flexion Towel Slide at Table Top  - 1 x daily - 7 x weekly - 3 sets - 10 reps - Standing Glute Med Mobilization with Small Ball on Wall  - 1 x daily - 7 x weekly - 3 sets - 10 reps  ASSESSMENT: Pt making good progress with therapy.  He reports reduction in overall pain and ability to return to some of his home activities. He reports lower back pain unpon waking this morning, that has now disappeared during session.   He is pleased with his progress. Good toleration to increased resistance with core engagement.  No pain sensitivity throughout session.    Initial Impression Patient is a 73 y.o. m who was seen today for physical therapy evaluation and treatment for LBP.  He reports generalized pain due to OA in most joints  for past 20 yrs.  Has had declined pain management up until this point as he was not interested in narcotics. Pt has begun using low does of Duloxetine for the past month and reports significant decrease in overall pain including his back (50-75%). He is an active senior and wants to remain capable of doing the things he likes to do. Functional and objective testing demonstrates decrease in hip and core  strength, posterior core tightness and pain impeding movement all which limit his ADL's and mobility.  He will benefti from skilled PT both land based and aquatics to improve all areas of deficits  OBJECTIVE IMPAIRMENTS: decreased activity tolerance, decreased balance, decreased mobility, difficulty walking, decreased strength, impaired flexibility, postural dysfunction, and pain.   ACTIVITY  LIMITATIONS: bending, sitting, squatting, stairs, transfers, reach over head, and locomotion level  PARTICIPATION LIMITATIONS: community activity and yard work  PERSONAL FACTORS: Time since onset of injury/illness/exacerbation and 1-2 comorbidities: see PmHx  are also affecting patient's functional outcome.   REHAB POTENTIAL: Good  CLINICAL DECISION MAKING: Evolving/moderate complexity  EVALUATION COMPLEXITY: High   GOALS: Goals reviewed with patient? Yes  SHORT TERM GOALS: Target date: 08/25/23  Pt will tolerate full aquatic sessions consistently without increase in pain and with improving function to demonstrate good toleration and effectiveness of intervention.  Baseline: Goal status: Met 08/25/23  2.  Pt will improve on 5 X STS test to <or= 17s (MCID) to demonstrate improving functional lower extremity strength, transitional movements, and balance Baseline: 19.27 Goal status: Met 09/08/23  3.  Pt will complete SLS in 3.6 ft consistently @20s  or > Baseline: unable land based Goal status: In process 08/25/23  4.  Pt to report reduction in pain with aquatic intervention at <2/10 while submerged Baseline:  Goal status: Met 08/25/23  5.  Pt will have improve on Lumbar Side bending ROM by 50%. Baseline: see chart Goal status: In process 08/25/23    LONG TERM GOALS: Target date: 10/03/23  Pt to meet stated Foto Goal Baseline:  Goal status: Met 09/09/23  2.  Pt will be indep with final HEP's (land and aquatic as appropriate) for continued management of condition Baseline:  Goal status:  INITIAL  3.  Pt will improve strength in hips and shoulders by at least 10 lbs to demonstrate improved overall physical function Baseline: see chart.  Shoulder strength in lbs TBA Goal status: INITIAL  4.  Pt will complete tandem and SLS x 20s to demonstrate improvement in balance ability. Baseline:  Goal status: INITIAL  5.  Pt will report "normal" pain to be 2 or less/10 for improve mobility Baseline: 4 Goal status: IN PROGRESS - 09/01/23  6.  Pt will report donning pants without difficulty Baseline: difficult due to muscle tightness in LE Goal status: INITIAL  PLAN:  PT FREQUENCY: 2x/week  PT DURATION: 8 weeks  PLANNED INTERVENTIONS: 97164- PT Re-evaluation, 97110-Therapeutic exercises, 97530- Therapeutic activity, 97112- Neuromuscular re-education, 97535- Self Care, 16109- Manual therapy, [id number]- Gait training, [phone]- Aquatic Therapy, 97014- Electrical stimulation (unattended), [phone]- Ionotophoresis 4mg /ml Dexamethasone, Patient/Family education, Balance training, Stair training, Taping, Dry Needling, Joint mobilization, DME instructions, Cryotherapy, and Moist heat.  PLAN FOR NEXT SESSION: Aquatics for core and LE stretching, pain reduction/management, balance retraining Land: UE/shoulder ROM and strengthening; LB and core strengthening; hip strengthening   Royal Hawthorn PT, DPT 09/10/23  9:30 AM   [facility] [address] [place], [place], [address] Phone: [phone]   Fax:  [phone]       Referring diagnosis? LBP Treatment diagnosis? (if different than referring diagnosis) LBP AND shoulder pain What was this (referring dx) caused by? []  Surgery []  Fall [x]  Ongoing issue [x]  Arthritis []  Other: ____________  Laterality: []  Rt []  Lt [x]  Both  Check all possible CPT codes:  *CHOOSE 10 OR LESS*    See Planned Interventions listed in the Plan section of the Evaluation.

## 2023-09-11 ENCOUNTER — Encounter: Payer: Self-pay | Admitting: Internal Medicine

## 2023-09-11 DIAGNOSIS — J309 Allergic rhinitis, unspecified: Secondary | ICD-10-CM | POA: Diagnosis not present

## 2023-09-11 DIAGNOSIS — K219 Gastro-esophageal reflux disease without esophagitis: Secondary | ICD-10-CM | POA: Diagnosis not present

## 2023-09-11 DIAGNOSIS — E785 Hyperlipidemia, unspecified: Secondary | ICD-10-CM | POA: Diagnosis not present

## 2023-09-11 DIAGNOSIS — M1380 Other specified arthritis, unspecified site: Secondary | ICD-10-CM | POA: Diagnosis not present

## 2023-09-11 DIAGNOSIS — M109 Gout, unspecified: Secondary | ICD-10-CM | POA: Diagnosis not present

## 2023-09-11 DIAGNOSIS — I10 Essential (primary) hypertension: Secondary | ICD-10-CM | POA: Diagnosis not present

## 2023-09-11 DIAGNOSIS — R946 Abnormal results of thyroid function studies: Secondary | ICD-10-CM | POA: Diagnosis not present

## 2023-09-11 DIAGNOSIS — Z79899 Other long term (current) drug therapy: Secondary | ICD-10-CM | POA: Diagnosis not present

## 2023-09-11 DIAGNOSIS — R7303 Prediabetes: Secondary | ICD-10-CM | POA: Diagnosis not present

## 2023-09-12 ENCOUNTER — Ambulatory Visit (INDEPENDENT_AMBULATORY_CARE_PROVIDER_SITE_OTHER): Payer: Medicare PPO

## 2023-09-12 VITALS — BP 160/75 | HR 62 | Temp 97.6°F | Resp 14 | Ht 71.0 in | Wt 220.0 lb

## 2023-09-12 DIAGNOSIS — K5100 Ulcerative (chronic) pancolitis without complications: Secondary | ICD-10-CM

## 2023-09-12 DIAGNOSIS — K51011 Ulcerative (chronic) pancolitis with rectal bleeding: Secondary | ICD-10-CM

## 2023-09-12 DIAGNOSIS — M1990 Unspecified osteoarthritis, unspecified site: Secondary | ICD-10-CM

## 2023-09-12 MED ORDER — VEDOLIZUMAB 300 MG IV SOLR
300.0000 mg | Freq: Once | INTRAVENOUS | Status: AC
  Administered 2023-09-12: 300 mg via INTRAVENOUS
  Filled 2023-09-12: qty 5

## 2023-09-12 NOTE — Progress Notes (Signed)
 Diagnosis: Ulcerative Colitis  Provider:  Chilton Greathouse MD  Procedure: IV Infusion  IV Type: Peripheral, IV Location: L Forearm  Entyvio (Vedolizumab), Dose: 300 mg  Infusion Start Time: 0845  Infusion Stop Time: 0916  Post Infusion IV Care: Peripheral IV Discontinued  Discharge: Condition: Good, Destination: Home . AVS Declined  Performed by:  Adriana Mccallum, RN

## 2023-09-15 ENCOUNTER — Encounter (HOSPITAL_BASED_OUTPATIENT_CLINIC_OR_DEPARTMENT_OTHER): Payer: Self-pay | Admitting: Physical Therapy

## 2023-09-15 ENCOUNTER — Ambulatory Visit (HOSPITAL_BASED_OUTPATIENT_CLINIC_OR_DEPARTMENT_OTHER): Payer: Medicare PPO | Attending: Physical Medicine & Rehabilitation | Admitting: Physical Therapy

## 2023-09-15 DIAGNOSIS — R2689 Other abnormalities of gait and mobility: Secondary | ICD-10-CM | POA: Diagnosis not present

## 2023-09-15 DIAGNOSIS — R531 Weakness: Secondary | ICD-10-CM | POA: Insufficient documentation

## 2023-09-15 DIAGNOSIS — M5459 Other low back pain: Secondary | ICD-10-CM | POA: Insufficient documentation

## 2023-09-15 DIAGNOSIS — M25512 Pain in left shoulder: Secondary | ICD-10-CM | POA: Diagnosis not present

## 2023-09-15 DIAGNOSIS — G8929 Other chronic pain: Secondary | ICD-10-CM | POA: Insufficient documentation

## 2023-09-15 DIAGNOSIS — M25511 Pain in right shoulder: Secondary | ICD-10-CM | POA: Diagnosis not present

## 2023-09-15 NOTE — Therapy (Signed)
 OUTPATIENT PHYSICAL THERAPY THORACOLUMBAR TREATMENT Progress Note Reporting Period 08/04/23 to 09/15/23  See note below for Objective Data and Assessment of Progress/Goals.       Patient Name: Christopher Smith MRN: [id number] DOB:[birth year], 73 y.o., male Today's Date: 09/15/2023  END OF SESSION:  PT End of Session - 09/15/23 0840     Visit Number 10    Number of Visits 16    Date for PT Re-Evaluation 10/03/23    Authorization Type [employer]    Authorization Time Period 16 visits approved From 01.20.2025 - 03.21.2025 Authorization #[id number]    Authorization - Number of Visits 16    Progress Note Due on Visit 10    PT Start Time 0840    PT Stop Time 0920    PT Time Calculation (min) 40 min    Activity Tolerance Patient tolerated treatment well    Behavior During Therapy [facility] for tasks assessed/performed                 Past Medical History:  Diagnosis Date   Allergy    Anal fissure    Arthritis    RA   Blood transfusion without reported diagnosis    COVID-19 06/2020   Diabetes mellitus    no meds taken 05-01-16- elevated glucose was from [facility] for UC   Diarrhea    Fatty liver    GERD (gastroesophageal reflux disease)    Gout    Hiatal hernia    Hypertension    Internal hemorrhoids    Polyarthritis    PONV (postoperative nausea and vomiting)    Sessile rectal polyp 03/27/2011   Sinusitis    Tuberculosis    positive PPD- had an allergic reaction to the PPD, gets yearly chest xray   Tubular adenoma of colon    Ulcerative colitis 01/24/2011   Past Surgical History:  Procedure Laterality Date   CHOLECYSTECTOMY     COLONOSCOPY     KNEE SURGERY     Bilateral    POLYPECTOMY     SHOULDER SURGERY Bilateral    Bilateral - rotator cuff repair    TOTAL KNEE ARTHROPLASTY Bilateral    Patient Active Problem List   Diagnosis Date Noted   Open fracture of one or more phalanges of hand 11/21/2017   Chronic inflammatory arthritis 08/05/2016   High risk  medication use 08/05/2016   Contracture of elbow, right 08/05/2016   History of total knee replacement, bilateral 08/05/2016   Idiopathic chronic gout 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 tophus 08/05/2016   History of bilateral rotator cuff tear repair 08/05/2016   Gastroesophageal reflux  08/05/2016   Latent tuberculosis by blood test 08/05/2016   Ulcerative colitis (HCC) 09/03/2011   Glucose intolerance (impaired glucose tolerance) 09/03/2011   Severe diarrhea 09/03/2011   URI (upper respiratory infection) 07/02/2011   Bile salt-induced diarrhea 07/02/2011   S/P cholecystectomy 07/02/2011   Ulcerative colitis (HCC) 04/03/2011   Benign neoplasm of colon 04/03/2011   Positive TB test 03/22/2011   Insomnia due to medical condition 03/22/2011   UC (ulcerative colitis) (HCC) 03/01/2011   Leg cramps 03/01/2011   Ulcerative colitis with rectal bleeding (HCC) 01/24/2011   Nonspecific abnormal finding in stool contents 01/24/2011   Heme positive stool 01/11/2011   Diarrhea 01/11/2011   Arthritis associated with inflammatory bowel disease 01/11/2011   Intentional weight loss 01/11/2011    PCP: Irena Reichmann MD  REFERRING PROVIDER: Erick Colace, MD   REFERRING DIAG:  M53.3 (ICD-10-CM) - Sacroiliac joint disease  M47.817 (ICD-10-CM) - Lumbosacral spondylosis without myelopathy    Rationale for Evaluation and Treatment: Rehabilitation  THERAPY DIAG:  Other low back pain  General weakness  Chronic pain of both shoulders  Other abnormalities of gait and mobility  ONSET DATE: >44yr  SUBJECTIVE:                                                                                                                                                                                           SUBJECTIVE STATEMENT:  "I think it is coming back some.  I go back to see Dr Wynn Banker this week". "I would like to go to the [facility] and do my exercises on my own".  POOL ACCESS:  Member of [employer], but has not  attended.     Initial subjective I have had bilat shoulder surgeries and TKEs. (X 18 yrs).  I am a busy guy, maybe my lifestyle may be what's causing some of my pain.  Pain everywhere.  I am on a new med that has helped a lot reduced pain intitally by 75 % now seems to be ~ 50%.  I've had falls in the past. Pain areas (shoulders, hands, feet, LB). Interrupts sleep.  Better since the new med.  PERTINENT HISTORY:  Chronic low back pain pain generator most likely lumbar facet joints on the right side. Pain is with extension greater than with flexion   PAIN:  Are you having pain? yes : NPRS scale: current 2/10;  Pain location: Lt neck/ upper trap Pain description: ache Aggravating factors: turning head to L Relieving factors: sitting  PRECAUTIONS: None  RED FLAGS: None   WEIGHT BEARING RESTRICTIONS: No  FALLS:  Has patient fallen in last 6 months? No  LIVING ENVIRONMENT: Lives with: lives with their spouse Lives in: House/apartment Stairs: No Has following equipment at home: none   OCCUPATION: retired Advice worker  PLOF: Independent  PATIENT GOALS: build strength, less pain, better balance, get my pants on easier  NEXT MD VISIT: next week  OBJECTIVE:  Note: Objective measures were completed at Evaluation unless otherwise noted.  DIAGNOSTIC FINDINGS:  X-rays of the lumbar spine were consistent with degenerative disc disease and facet joint arthropathy.  X-rays of the pelvis were unremarkable   PATIENT SURVEYS:  FOTO Risk adjusted 43%; Primary measure 61% with goal of 61% 09/08/23: 72%  COGNITION: Overall cognitive status: Within functional limits for tasks assessed     SENSATION: WFL  MUSCLE LENGTH: Hamstrings: Ms shortened   POSTURE:  slight rounding of shoulders and forward head  PALPATION: Ms tightness throughout spine and post shoulder areas.  No tenderness  Shoulder ROM: 25 % limited with internal and external should rotation Shoulder strength:  shoulder flex and abd 4/5   LUMBAR ROM:   AROM eval 09/15/23  Flexion FT to mid  calf P! FT to mid calf stiff  Extension Full P! Full stiff  Right lateral flexion 50% limited P! 10% limited stiff  Left lateral flexion 50% limited P! 10% limited stiff  Right rotation    Left rotation     (Blank rows = not tested)  LOWER EXTREMITY strength:     Strength in lbs Right eval Left eval R / L  Hip flexion 49.8 45.4 50.9 / 44.5  Hip extension     Hip abduction     Hip adduction 28.1 32.9 32.9 / 43.1  Hip internal rotation     Hip external rotation     Knee flexion     Knee extension 57.5 52.7   Ankle dorsiflexion     Ankle plantarflexion     Ankle inversion     Ankle eversion      (Blank rows = not tested)  LOWER EXTREMITY ROM:    Some limitation in hip ext and in/ex rotation due to muscle tightness  LUMBAR SPECIAL TESTS:  Slump test: Negative and SI Compression/distraction test: Negative  FUNCTIONAL TESTS:  5 times sit to stand: 19.27 From pool bench Timed up and go (TUG): 10.55 4 stage balance: passed 1&2.  Tandem stance 10s; SLS 7s  09/08/23: 5 STS 11.80  09/15/23: Tandem and SLS >20s  GAIT: Distance walked: 500 Assistive device utilized: None Level of assistance: Complete Independence Comments: Guarded UE, decreased arm swing  TREATMENT  09/15/23 Re-assessment completed. Self care: Discussion and instruction around forward progression of management of chronic condition through different management tools of exercise regimes a well as meds and injections as prescribed by MD.    Pt seen for aquatic therapy today.  Treatment took place in water 3.5-4.75 ft in depth at the [facility] pool. Temp of water was 91.  Pt entered/exited the pool via stairs using step-through pattern with  hand rail.   *walking 4.2 ft forward and backward with reciprocal arm swing *Farmers carry with yellow hand floats, cues for TrA hold throughout, marching forward and  backward *side stepping with arm addct/ abdct with yellow hand floats x 4 laps *TrA set/noodle press solid noodle->yellow noodle to thighs in staggered stance x10 each LE forward *step ups leading R/L bottom step *Runners step ups x 10 R/L    Last land 09/10/2023:  Manual:  Skilled palpation of trigger points in UT and rhomboids. Sub-occipital release. Gentle manual traction.     There-ex: Nustep lvl 5, 5 min with PT present for subjective.  Prone scaption Horizontal abduction with GTB   Self Care:  Reviewed and updated HEP   Nuro-Re-ed  LF row with TA breathing 3x10 25 lbs  Standing prone press out with rotation with focus on TA contraction and breathing with RTB x10, bilat    PATIENT EDUCATION:  Education details: exercise rationale, technique, modifications   Person educated: Patient Education method: Explanation Education comprehension: verbalized understanding  HOME EXERCISE PROGRAM: Access Code: [email or web address] URL: [email or web address] Date: 08/20/2023 Prepared by: [facility] [address]  Exercises - Shoulder Extension with Resistance  - 3 x weekly - 1-2 sets - 10 reps - Standing 'L' Stretch at Counter  - 2 x daily - 7 x weekly - 2 reps - 10 seconds hold - Seated Hamstring Stretch  -  2 x daily - 7 x weekly - 2-3 reps - 15-20 hold - Seated Figure 4 Piriformis Stretch  - 2 x daily - 7 x weekly - 2 reps - 15-20 hold - Supine Lower Trunk Rotation  - 2 x daily - 7 x weekly - 3-5 reps - 5-10 hold - Supine Transversus Abdominis Bracing - Hands on Thighs  - 1 x daily - 7 x weekly - 10 reps - 5 hold   Land HEP  Access Code: [email or web address] URL: [email or web address] Date: 09/05/2023 Prepared by: Lorayne Bender  Exercises - Theracane Over Shoulder  - 1 x daily - 7 x weekly - 3 sets - 10 reps - Seated Cervical Rotation AROM  - 2-3 x daily - 7 x weekly - 3 reps - Gentle Levator Scapulae Stretch  - 1 x daily - 7 x weekly - 3 sets  - 3 reps - 20 hold - Seated Upper Trapezius Stretch  - 1 x daily - 7 x weekly - 3 sets - 3 reps - 20 hold - Scapular Retraction with Resistance  - 1 x daily - 7 x weekly - 3 sets - 15 reps - Shoulder extension with resistance - Neutral  - 1 x daily - 7 x weekly - 3 sets - 15 reps - Supine Lower Trunk Rotation  - 1 x daily - 7 x weekly - 3 sets - 10 reps - Supine Piriformis Stretch with Foot on Ground  - 1 x daily - 7 x weekly - 3 sets - 3 reps - 20 hold - Seated Bilateral Shoulder Flexion Towel Slide at Table Top  - 1 x daily - 7 x weekly - 3 sets - 10 reps - Standing Glute Med Mobilization with Small Ball on Wall  - 1 x daily - 7 x weekly - 3 sets - 10 reps  ASSESSMENT: PN: Pt making progress in all areas of function and strength as noted above.  He reports significant reduction in pain since onset of therapy as well as initiating new med use as per MD rating "usual" pain 1-2/10. He continues to want to avoid any kind of surgical intervention. He has met all of his STGs and exceeded his foto goal.  He is planning on continuing his exercises regimen at [facility] once DC from skilled services. His main deficit today is with hip strength flex in particular. He will benefit from continued skilled PT with anticipation to add land visits where aquatic is scheduled to increase load with exercises to better build strength.  Will see pt at least 1 more time in aquatics to issue and instruct on HEP. Land based therapists please check for any cancellations for pt to switch land based appt for the remaining aquatic.  Anticipate pt will be ready fro DC by end of cert.      OBJECTIVE IMPAIRMENTS: decreased activity tolerance, decreased balance, decreased mobility, difficulty walking, decreased strength, impaired flexibility, postural dysfunction, and pain.   ACTIVITY LIMITATIONS: bending, sitting, squatting, stairs, transfers, reach over head, and locomotion level  PARTICIPATION LIMITATIONS: community activity and  yard work  PERSONAL FACTORS: Time since onset of injury/illness/exacerbation and 1-2 comorbidities: see PmHx  are also affecting patient's functional outcome.   REHAB POTENTIAL: Good  CLINICAL DECISION MAKING: Evolving/moderate complexity  EVALUATION COMPLEXITY: High   GOALS: Goals reviewed with patient? Yes  SHORT TERM GOALS: Target date: 08/25/23  Pt will tolerate full aquatic sessions consistently without increase in pain and with improving function to demonstrate good toleration  and effectiveness of intervention.  Baseline: Goal status: Met 08/25/23  2.  Pt will improve on 5 X STS test to <or= 17s (MCID) to demonstrate improving functional lower extremity strength, transitional movements, and balance Baseline: 19.27 Goal status: Met 09/08/23  3.  Pt will complete SLS in 3.6 ft consistently @20s  or > Baseline: unable land based Goal status: In process 08/25/23;  Met 09/15/23  4.  Pt to report reduction in pain with aquatic intervention at <2/10 while submerged Baseline:  Goal status: Met 08/25/23  5.  Pt will have improve on Lumbar Side bending ROM by 50%. Baseline: see chart Goal status: In process 08/25/23; 09/15/23    LONG TERM GOALS: Target date: 10/03/23  Pt to meet stated Foto Goal Baseline:  Goal status: Met 09/09/23  2.  Pt will be indep with final HEP's (land and aquatic as appropriate) for continued management of condition Baseline:  Goal status: INITIAL  3.  Pt will improve strength in hips and shoulders by at least 10 lbs to demonstrate improved overall physical function Baseline: see chart.  Shoulder strength in lbs TBA Goal status: In progress 09/15/23  4.  Pt will complete tandem and SLS x 20s to demonstrate improvement in balance ability. Baseline:  Goal status: Met 09/15/23  5.  Pt will report "normal" pain to be 2 or less/10 for improve mobility Baseline: 4 Goal status: IN PROGRESS - 09/01/23; 09/15/23  6.  Pt will report donning pants without  difficulty Baseline: difficult due to muscle tightness in LE Goal status: INITIAL  PLAN:  PT FREQUENCY: 2x/week  PT DURATION: 8 weeks  PLANNED INTERVENTIONS: 97164- PT Re-evaluation, 97110-Therapeutic exercises, 97530- Therapeutic activity, 97112- Neuromuscular re-education, 97535- Self Care, 16109- Manual therapy, [id number]- Gait training, [id number]- Aquatic Therapy, 97014- Electrical stimulation (unattended), [id number]- Ionotophoresis 4mg /ml Dexamethasone, Patient/Family education, Balance training, Stair training, Taping, Dry Needling, Joint mobilization, DME instructions, Cryotherapy, and Moist heat.  PLAN FOR NEXT SESSION: Aquatics f: HEP Land: body mechanics to lift wood; focus on hip strengthening, HEP  Rushie Chestnut) Imogen Maddalena MPT 09/15/23 9:41 AM [facility] [address] [place], [place], [address] Phone: [phone]   Fax:  [phone]    [facility] [address] [place], [place], [address] Phone: [phone]   Fax:  [phone]       Referring diagnosis? LBP Treatment diagnosis? (if different than referring diagnosis) LBP AND shoulder pain What was this (referring dx) caused by? []  Surgery []  Fall [x]  Ongoing issue [x]  Arthritis []  Other: ____________  Laterality: []  Rt []  Lt [x]  Both  Check all possible CPT codes:  *CHOOSE 10 OR LESS*    See Planned Interventions listed in the Plan section of the Evaluation.

## 2023-09-16 ENCOUNTER — Other Ambulatory Visit (HOSPITAL_COMMUNITY): Payer: Self-pay

## 2023-09-17 ENCOUNTER — Ambulatory Visit (HOSPITAL_BASED_OUTPATIENT_CLINIC_OR_DEPARTMENT_OTHER): Payer: Medicare PPO | Admitting: Physical Therapy

## 2023-09-17 DIAGNOSIS — M5459 Other low back pain: Secondary | ICD-10-CM

## 2023-09-17 DIAGNOSIS — G8929 Other chronic pain: Secondary | ICD-10-CM

## 2023-09-17 DIAGNOSIS — R531 Weakness: Secondary | ICD-10-CM

## 2023-09-17 DIAGNOSIS — M25511 Pain in right shoulder: Secondary | ICD-10-CM | POA: Diagnosis not present

## 2023-09-17 DIAGNOSIS — M25512 Pain in left shoulder: Secondary | ICD-10-CM | POA: Diagnosis not present

## 2023-09-17 DIAGNOSIS — R2689 Other abnormalities of gait and mobility: Secondary | ICD-10-CM | POA: Diagnosis not present

## 2023-09-17 NOTE — Therapy (Signed)
 OUTPATIENT PHYSICAL THERAPY THORACOLUMBAR TREATMENT Progress Note Reporting Period 08/04/23 to 09/15/23  See note below for Objective Data and Assessment of Progress/Goals.       Patient Name: Christopher Smith MRN: [id number] DOB:Mar 05, 1951, 73 y.o., male Today's Date: 09/17/2023  END OF SESSION:        Past Medical History:  Diagnosis Date   Allergy    Anal fissure    Arthritis    RA   Blood transfusion without reported diagnosis    COVID-19 06/2020   Diabetes mellitus    no meds taken 05-01-16- elevated glucose was from [facility] for UC   Diarrhea    Fatty liver    GERD (gastroesophageal reflux disease)    Gout    Hiatal hernia    Hypertension    Internal hemorrhoids    Polyarthritis    PONV (postoperative nausea and vomiting)    Sessile rectal polyp 03/27/2011   Sinusitis    Tuberculosis    positive PPD- had an allergic reaction to the PPD, gets yearly chest xray   Tubular adenoma of colon    Ulcerative colitis 01/24/2011   Past Surgical History:  Procedure Laterality Date   CHOLECYSTECTOMY     COLONOSCOPY     KNEE SURGERY     Bilateral    POLYPECTOMY     SHOULDER SURGERY Bilateral    Bilateral - rotator cuff repair    TOTAL KNEE ARTHROPLASTY Bilateral    Patient Active Problem List   Diagnosis Date Noted   Open fracture of one or more phalanges of hand 11/21/2017   Chronic inflammatory arthritis 08/05/2016   High risk medication use 08/05/2016   Contracture of elbow, right 08/05/2016   History of total knee replacement, bilateral 08/05/2016   Idiopathic chronic gout 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 tophus 08/05/2016   History of bilateral rotator cuff tear repair 08/05/2016   Gastroesophageal reflux  08/05/2016   Latent tuberculosis by blood test 08/05/2016   Ulcerative colitis (HCC) 09/03/2011   Glucose intolerance (impaired glucose tolerance) 09/03/2011   Severe diarrhea 09/03/2011   URI (upper respiratory infection) 07/02/2011   Bile salt-induced diarrhea  07/02/2011   S/P cholecystectomy 07/02/2011   Ulcerative colitis (HCC) 04/03/2011   Benign neoplasm of colon 04/03/2011   Positive TB test 03/22/2011   Insomnia due to medical condition 03/22/2011   UC (ulcerative colitis) (HCC) 03/01/2011   Leg cramps 03/01/2011   Ulcerative colitis with rectal bleeding (HCC) 01/24/2011   Nonspecific abnormal finding in stool contents 01/24/2011   Heme positive stool 01/11/2011   Diarrhea 01/11/2011   Arthritis associated with inflammatory bowel disease 01/11/2011   Intentional weight loss 01/11/2011    PCP: Irena Reichmann MD  REFERRING PROVIDER: Erick Colace, MD   REFERRING DIAG:  M53.3 (ICD-10-CM) - Sacroiliac joint disease  M47.817 (ICD-10-CM) - Lumbosacral spondylosis without myelopathy    Rationale for Evaluation and Treatment: Rehabilitation  THERAPY DIAG:  No diagnosis found.  ONSET DATE: >45yr  SUBJECTIVE:  SUBJECTIVE STATEMENT: The patient reports he feels pretty good this morning. He had a little mid thoracic pain.     POOL ACCESS:  Member of [employer], but has not attended.     Initial subjective I have had bilat shoulder surgeries and TKEs. (X 18 yrs).  I am a busy guy, maybe my lifestyle may be what's causing some of my pain.  Pain everywhere.  I am on a new med that has helped a lot reduced pain intitally by 75 % now seems to be ~ 50%.  I've had falls in the past. Pain areas (shoulders, hands, feet, LB). Interrupts sleep.  Better since the new med.  PERTINENT HISTORY:  Chronic low back pain pain generator most likely lumbar facet joints on the right side. Pain is with extension greater than with flexion   PAIN:  Are you having pain? yes : NPRS scale: current 2/10;  Pain location: Lt neck/ upper trap Pain description:  ache Aggravating factors: turning head to L Relieving factors: sitting  PRECAUTIONS: None  RED FLAGS: None   WEIGHT BEARING RESTRICTIONS: No  FALLS:  Has patient fallen in last 6 months? No  LIVING ENVIRONMENT: Lives with: lives with their spouse Lives in: House/apartment Stairs: No Has following equipment at home: none   OCCUPATION: retired Advice worker  PLOF: Independent  PATIENT GOALS: build strength, less pain, better balance, get my pants on easier  NEXT MD VISIT: next week  OBJECTIVE:  Note: Objective measures were completed at Evaluation unless otherwise noted.  DIAGNOSTIC FINDINGS:  X-rays of the lumbar spine were consistent with degenerative disc disease and facet joint arthropathy.  X-rays of the pelvis were unremarkable   PATIENT SURVEYS:  FOTO Risk adjusted 43%; Primary measure 61% with goal of 61% 09/08/23: 72%  COGNITION: Overall cognitive status: Within functional limits for tasks assessed     SENSATION: WFL  MUSCLE LENGTH: Hamstrings: Ms shortened   POSTURE:  slight rounding of shoulders and forward head  PALPATION: Ms tightness throughout spine and post shoulder areas.  No tenderness   Shoulder ROM: 25 % limited with internal and external should rotation Shoulder strength: shoulder flex and abd 4/5   LUMBAR ROM:   AROM eval 09/15/23  Flexion FT to mid  calf P! FT to mid calf stiff  Extension Full P! Full stiff  Right lateral flexion 50% limited P! 10% limited stiff  Left lateral flexion 50% limited P! 10% limited stiff  Right rotation    Left rotation     (Blank rows = not tested)  LOWER EXTREMITY strength:     Strength in lbs Right eval Left eval R / L  Hip flexion 49.8 45.4 50.9 / 44.5  Hip extension     Hip abduction     Hip adduction 28.1 32.9 32.9 / 43.1  Hip internal rotation     Hip external rotation     Knee flexion     Knee extension 57.5 52.7   Ankle dorsiflexion     Ankle plantarflexion     Ankle inversion      Ankle eversion      (Blank rows = not tested)  LOWER EXTREMITY ROM:    Some limitation in hip ext and in/ex rotation due to muscle tightness  LUMBAR SPECIAL TESTS:  Slump test: Negative and SI Compression/distraction test: Negative  FUNCTIONAL TESTS:  5 times sit to stand: 19.27 From pool bench Timed up and go (TUG): 10.55 4 stage balance: passed 1&2.  Tandem  stance 10s; SLS 7s  09/08/23: 5 STS 11.80  09/15/23: Tandem and SLS >20s  GAIT: Distance walked: 500 Assistive device utilized: None Level of assistance: Complete Independence Comments: Guarded UE, decreased arm swing  TREATMENT  3/5  Manual:  Skilled palpation of trigger points in UT and rhomboids. Sub-occipital release. Gentle manual traction.  Prone gross thoracic mobilization. Trigger point release to thoracic spine and peri-scapular area    There-ex: Nustep lvl 5, 5 min  Self Care:  Reviewed and updated HEP   Nuro-Re-ed  Cybex row with TA breathing 3x10 45 lbs  Cybex lat pull down 3x10 35 lbs  LF triceps pushdown 30 lbs and 45 lbs    09/15/23 Re-assessment completed. Self care: Discussion and instruction around forward progression of management of chronic condition through different management tools of exercise regimes a well as meds and injections as prescribed by MD.    Pt seen for aquatic therapy today.  Treatment took place in water 3.5-4.75 ft in depth at the [facility] pool. Temp of water was 91.  Pt entered/exited the pool via stairs using step-through pattern with  hand rail.   *walking 4.2 ft forward and backward with reciprocal arm swing *Farmers carry with yellow hand floats, cues for TrA hold throughout, marching forward and backward *side stepping with arm addct/ abdct with yellow hand floats x 4 laps *TrA set/noodle press solid noodle->yellow noodle to thighs in staggered stance x10 each LE forward *step ups leading R/L bottom step *Runners step ups x 10 R/L    Last  land 09/10/2023:  Manual:  Skilled palpation of trigger points in UT and rhomboids. Sub-occipital release. Gentle manual traction.     There-ex: Nustep lvl 5, 5 min with PT present for subjective.  Prone scaption Horizontal abduction with GTB   Self Care:  Reviewed and updated HEP   Nuro-Re-ed  LF row with TA breathing 3x10 25 lbs  Standing prone press out with rotation with focus on TA contraction and breathing with RTB x10, bilat    PATIENT EDUCATION:  Education details: exercise rationale, technique, modifications   Person educated: Patient Education method: Explanation Education comprehension: verbalized understanding  HOME EXERCISE PROGRAM: Access Code: [email or web address] URL: [email or web address] Date: 08/20/2023 Prepared by: [facility] [address]  Exercises - Shoulder Extension with Resistance  - 3 x weekly - 1-2 sets - 10 reps - Standing 'L' Stretch at Counter  - 2 x daily - 7 x weekly - 2 reps - 10 seconds hold - Seated Hamstring Stretch  - 2 x daily - 7 x weekly - 2-3 reps - 15-20 hold - Seated Figure 4 Piriformis Stretch  - 2 x daily - 7 x weekly - 2 reps - 15-20 hold - Supine Lower Trunk Rotation  - 2 x daily - 7 x weekly - 3-5 reps - 5-10 hold - Supine Transversus Abdominis Bracing - Hands on Thighs  - 1 x daily - 7 x weekly - 10 reps - 5 hold   Land HEP  Access Code: [email or web address] URL: [email or web address] Date: 09/05/2023 Prepared by: Lorayne Bender  Exercises - Theracane Over Shoulder  - 1 x daily - 7 x weekly - 3 sets - 10 reps - Seated Cervical Rotation AROM  - 2-3 x daily - 7 x weekly - 3 reps - Gentle Levator Scapulae Stretch  - 1 x daily - 7 x weekly - 3 sets - 3 reps - 20 hold - Seated Upper Trapezius Stretch  - 1  x daily - 7 x weekly - 3 sets - 3 reps - 20 hold - Scapular Retraction with Resistance  - 1 x daily - 7 x weekly - 3 sets - 15 reps - Shoulder extension with resistance - Neutral  - 1 x daily - 7  x weekly - 3 sets - 15 reps - Supine Lower Trunk Rotation  - 1 x daily - 7 x weekly - 3 sets - 10 reps - Supine Piriformis Stretch with Foot on Ground  - 1 x daily - 7 x weekly - 3 sets - 3 reps - 20 hold - Seated Bilateral Shoulder Flexion Towel Slide at Table Top  - 1 x daily - 7 x weekly - 3 sets - 10 reps - Standing Glute Med Mobilization with Small Ball on Wall  - 1 x daily - 7 x weekly - 3 sets - 10 reps  ASSESSMENT: The patient had mild spasming in his mid thoracic area today. Overall he I progressing well. He had less spasming in his neck and traps. We reviewed Cybex equipment today. [facility] is progressing towards D/C at this time.    PN: Pt making progress in all areas of function and strength as noted above.  He reports significant reduction in pain since onset of therapy as well as initiating new med use as per MD rating "usual" pain 1-2/10. He continues to want to avoid any kind of surgical intervention. He has met all of his STGs and exceeded his foto goal.  He is planning on continuing his exercises regimen at [facility] once DC from skilled services. His main deficit today is with hip strength flex in particular. He will benefit from continued skilled PT with anticipation to add land visits where aquatic is scheduled to increase load with exercises to better build strength.  Will see pt at least 1 more time in aquatics to issue and instruct on HEP. Land based therapists please check for any cancellations for pt to switch land based appt for the remaining aquatic.  Anticipate pt will be ready fro DC by end of cert.      OBJECTIVE IMPAIRMENTS: decreased activity tolerance, decreased balance, decreased mobility, difficulty walking, decreased strength, impaired flexibility, postural dysfunction, and pain.   ACTIVITY LIMITATIONS: bending, sitting, squatting, stairs, transfers, reach over head, and locomotion level  PARTICIPATION LIMITATIONS: community activity and  yard work  PERSONAL FACTORS: Time since onset of injury/illness/exacerbation and 1-2 comorbidities: see PmHx  are also affecting patient's functional outcome.   REHAB POTENTIAL: Good  CLINICAL DECISION MAKING: Evolving/moderate complexity  EVALUATION COMPLEXITY: High   GOALS: Goals reviewed with patient? Yes  SHORT TERM GOALS: Target date: 08/25/23  Pt will tolerate full aquatic sessions consistently without increase in pain and with improving function to demonstrate good toleration and effectiveness of intervention.  Baseline: Goal status: Met 08/25/23  2.  Pt will improve on 5 X STS test to <or= 17s (MCID) to demonstrate improving functional lower extremity strength, transitional movements, and balance Baseline: 19.27 Goal status: Met 09/08/23  3.  Pt will complete SLS in 3.6 ft consistently @20s  or > Baseline: unable land based Goal status: In process 08/25/23;  Met 09/15/23  4.  Pt to report reduction in pain with aquatic intervention at <2/10 while submerged Baseline:  Goal status: Met 08/25/23  5.  Pt will have improve on Lumbar Side bending ROM by 50%. Baseline: see chart Goal status: In process 08/25/23; 09/15/23  LONG TERM GOALS: Target date: 10/03/23  Pt to meet stated Foto Goal Baseline:  Goal status: Met 09/09/23  2.  Pt will be indep with final HEP's (land and aquatic as appropriate) for continued management of condition Baseline:  Goal status: INITIAL  3.  Pt will improve strength in hips and shoulders by at least 10 lbs to demonstrate improved overall physical function Baseline: see chart.  Shoulder strength in lbs TBA Goal status: In progress 09/15/23  4.  Pt will complete tandem and SLS x 20s to demonstrate improvement in balance ability. Baseline:  Goal status: Met 09/15/23  5.  Pt will report "normal" pain to be 2 or less/10 for improve mobility Baseline: 4 Goal status: IN PROGRESS - 09/01/23; 09/15/23  6.  Pt will report donning pants without  difficulty Baseline: difficult due to muscle tightness in LE Goal status: INITIAL  PLAN:  PT FREQUENCY: 2x/week  PT DURATION: 8 weeks  PLANNED INTERVENTIONS: 97164- PT Re-evaluation, 97110-Therapeutic exercises, 97530- Therapeutic activity, 97112- Neuromuscular re-education, 97535- Self Care, 41324- Manual therapy, [id number]- Gait training, [phone]- Aquatic Therapy, 97014- Electrical stimulation (unattended), [phone]- Ionotophoresis 4mg /ml Dexamethasone, Patient/Family education, Balance training, Stair training, Taping, Dry Needling, Joint mobilization, DME instructions, Cryotherapy, and Moist heat.  PLAN FOR NEXT SESSION: Aquatics f: HEP Land: body mechanics to lift wood; focus on hip strengthening, HEP  Rushie Chestnut) Ziemba MPT 09/17/23 8:48 AM [facility] [address] [place], [place], [address] Phone: [phone]   Fax:  [phone]    [facility] [address] [place], [place], [address] Phone: [phone]   Fax:  [phone]       Referring diagnosis? LBP Treatment diagnosis? (if different than referring diagnosis) LBP AND shoulder pain What was this (referring dx) caused by? []  Surgery []  Fall [x]  Ongoing issue [x]  Arthritis []  Other: ____________  Laterality: []  Rt []  Lt [x]  Both  Check all possible CPT codes:  *CHOOSE 10 OR LESS*    See Planned Interventions listed in the Plan section of the Evaluation.

## 2023-09-17 NOTE — Progress Notes (Unsigned)
  PROCEDURE RECORD [facility]   Name: Christopher Smith DOB:Jan 06, 1951 MRN: [id number]  Date:09/17/2023  Physician: {CPRM-PROVIDERS:[id number]}    Nurse/CMA: ***  Allergies: No Known Allergies  Consent Signed: {yes OZ:308657}  Is patient diabetic? {yes no:314532}  CBG today? ***  Pregnant: {yes no:314532} LMP: No LMP for male patient. (age 74-55)  Anticoagulants: {Yes/No:19989} Anti-inflammatory: {Yes/No:19989} Antibiotics: {Yes/No:19989}  Procedure: ***  Position: {PRONE/SUPINE/SITTING/LATERAL:21022916} Start Time: ***  End Time: ***  Fluoro Time: ***  RN/CMA *** ***    Time *** ***    BP *** ***    Pulse *** ***    Respirations *** ***    O2 Sat *** ***    S/S *** ***    Pain Level *** ***     D/C home with ***, patient A & O X 3, D/C instructions reviewed, and sits independently.

## 2023-09-18 ENCOUNTER — Encounter: Payer: Medicare PPO | Admitting: Physical Medicine & Rehabilitation

## 2023-09-18 ENCOUNTER — Encounter: Payer: Self-pay | Admitting: Physical Medicine & Rehabilitation

## 2023-09-18 DIAGNOSIS — M7918 Myalgia, other site: Secondary | ICD-10-CM

## 2023-09-18 NOTE — Progress Notes (Signed)
 Patient's low back pain level only 1/10 will defer repeat L3-4-5 medial branch blocks until pain is more severe.  Continue outpatient therapy Will ask physical therapy to incorporate dry needling for neck and upper back pain  Patient has no tenderness palpation in the upper trap area but he still feels like he has a deep pain in that area range of motion cervical spine is around 50% flexion extension lateral bending and rotation.

## 2023-09-22 ENCOUNTER — Ambulatory Visit (HOSPITAL_BASED_OUTPATIENT_CLINIC_OR_DEPARTMENT_OTHER): Payer: Medicare PPO | Admitting: Physical Therapy

## 2023-09-22 ENCOUNTER — Encounter (HOSPITAL_BASED_OUTPATIENT_CLINIC_OR_DEPARTMENT_OTHER): Payer: Self-pay | Admitting: Physical Therapy

## 2023-09-22 DIAGNOSIS — G8929 Other chronic pain: Secondary | ICD-10-CM

## 2023-09-22 DIAGNOSIS — M5459 Other low back pain: Secondary | ICD-10-CM | POA: Diagnosis not present

## 2023-09-22 DIAGNOSIS — M25512 Pain in left shoulder: Secondary | ICD-10-CM | POA: Diagnosis not present

## 2023-09-22 DIAGNOSIS — R2689 Other abnormalities of gait and mobility: Secondary | ICD-10-CM | POA: Diagnosis not present

## 2023-09-22 DIAGNOSIS — R531 Weakness: Secondary | ICD-10-CM

## 2023-09-22 DIAGNOSIS — M25511 Pain in right shoulder: Secondary | ICD-10-CM | POA: Diagnosis not present

## 2023-09-22 NOTE — Therapy (Signed)
 OUTPATIENT PHYSICAL THERAPY THORACOLUMBAR TREATMENT PHYSICAL THERAPY DISCHARGE SUMMARY  Visits from Start of Care: 12  Current functional level related to goals / functional outcomes: Indep   Remaining deficits: Chronic (controlled) condition   Education / Equipment: Management of condition   Patient agrees to discharge. Patient goals were partially met. Patient is being discharged due to being pleased with the current functional level.       Patient Name: Christopher Smith MRN: [id number] DOB:17-Mar-1951, 73 y.o., male Today's Date: 09/22/2023  END OF SESSION:  PT End of Session - 09/22/23 0846     Visit Number 12    Number of Visits 16    Date for PT Re-Evaluation 10/03/23    PT Start Time 0839    PT Stop Time 0920    PT Time Calculation (min) 41 min    Activity Tolerance Patient tolerated treatment well    Behavior During Therapy [facility] for tasks assessed/performed                  Past Medical History:  Diagnosis Date   Allergy    Anal fissure    Arthritis    RA   Blood transfusion without reported diagnosis    COVID-19 06/2020   Diabetes mellitus    no meds taken 05-01-16- elevated glucose was from [facility] for UC   Diarrhea    Fatty liver    GERD (gastroesophageal reflux disease)    Gout    Hiatal hernia    Hypertension    Internal hemorrhoids    Polyarthritis    PONV (postoperative nausea and vomiting)    Sessile rectal polyp 03/27/2011   Sinusitis    Tuberculosis    positive PPD- had an allergic reaction to the PPD, gets yearly chest xray   Tubular adenoma of colon    Ulcerative colitis 01/24/2011   Past Surgical History:  Procedure Laterality Date   CHOLECYSTECTOMY     COLONOSCOPY     KNEE SURGERY     Bilateral    POLYPECTOMY     SHOULDER SURGERY Bilateral    Bilateral - rotator cuff repair    TOTAL KNEE ARTHROPLASTY Bilateral    Patient Active Problem List   Diagnosis Date Noted   Open fracture of one or more phalanges of hand  11/21/2017   Chronic inflammatory arthritis 08/05/2016   High risk medication use 08/05/2016   Contracture of elbow, right 08/05/2016   History of total knee replacement, bilateral 08/05/2016   Idiopathic chronic gout 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 tophus 08/05/2016   History of bilateral rotator cuff tear repair 08/05/2016   Gastroesophageal reflux  08/05/2016   Latent tuberculosis by blood test 08/05/2016   Ulcerative colitis (HCC) 09/03/2011   Glucose intolerance (impaired glucose tolerance) 09/03/2011   Severe diarrhea 09/03/2011   URI (upper respiratory infection) 07/02/2011   Bile salt-induced diarrhea 07/02/2011   S/P cholecystectomy 07/02/2011   Ulcerative colitis (HCC) 04/03/2011   Benign neoplasm of colon 04/03/2011   Positive TB test 03/22/2011   Insomnia due to medical condition 03/22/2011   UC (ulcerative colitis) (HCC) 03/01/2011   Leg cramps 03/01/2011   Ulcerative colitis with rectal bleeding (HCC) 01/24/2011   Nonspecific abnormal finding in stool contents 01/24/2011   Heme positive stool 01/11/2011   Diarrhea 01/11/2011   Arthritis associated with inflammatory bowel disease 01/11/2011   Intentional weight loss 01/11/2011    PCP: Irena Reichmann MD  REFERRING PROVIDER: Erick Colace, MD   REFERRING DIAG:  M53.3 (ICD-10-CM) -  Sacroiliac joint disease  M47.817 (ICD-10-CM) - Lumbosacral spondylosis without myelopathy    Rationale for Evaluation and Treatment: Rehabilitation  THERAPY DIAG:  General weakness  Chronic pain of both shoulders  ONSET DATE: >3yr  SUBJECTIVE:                                                                                                                                                                                           SUBJECTIVE STATEMENT: Today is going to be my last session.  I saw the doctor He and I feel I am good.  Will do it on my own.  My back and overall pain is much better    POOL ACCESS:  Member of [facility], but  has not attended.     Initial subjective I have had bilat shoulder surgeries and TKEs. (X 18 yrs).  I am a busy guy, maybe my lifestyle may be what's causing some of my pain.  Pain everywhere.  I am on a new med that has helped a lot reduced pain intitally by 75 % now seems to be ~ 50%.  I've had falls in the past. Pain areas (shoulders, hands, feet, LB). Interrupts sleep.  Better since the new med.  PERTINENT HISTORY:  Chronic low back pain pain generator most likely lumbar facet joints on the right side. Pain is with extension greater than with flexion   PAIN:  Are you having pain? yes : NPRS scale: current 0-1/10;  Pain location: Lt neck/ upper trap Pain description: ache Aggravating factors: turning head to L Relieving factors: sitting  PRECAUTIONS: None  RED FLAGS: None   WEIGHT BEARING RESTRICTIONS: No  FALLS:  Has patient fallen in last 6 months? No  LIVING ENVIRONMENT: Lives with: lives with their spouse Lives in: House/apartment Stairs: No Has following equipment at home: none   OCCUPATION: retired Advice worker  PLOF: Independent  PATIENT GOALS: build strength, less pain, better balance, get my pants on easier  NEXT MD VISIT: next week  OBJECTIVE:  Note: Objective measures were completed at Evaluation unless otherwise noted.  DIAGNOSTIC FINDINGS:  X-rays of the lumbar spine were consistent with degenerative disc disease and facet joint arthropathy.  X-rays of the pelvis were unremarkable   PATIENT SURVEYS:  FOTO Risk adjusted 43%; Primary measure 61% with goal of 61% 09/08/23: 72%  COGNITION: Overall cognitive status: Within functional limits for tasks assessed     SENSATION: WFL  MUSCLE LENGTH: Hamstrings: Ms shortened   POSTURE:  slight rounding of shoulders and forward head  PALPATION: Ms tightness throughout spine and post shoulder areas.  No tenderness   Shoulder ROM:  25 % limited with internal and external should rotation Shoulder  strength: shoulder flex and abd 4/5   LUMBAR ROM:   AROM eval 09/15/23  Flexion FT to mid  calf P! FT to mid calf stiff  Extension Full P! Full stiff  Right lateral flexion 50% limited P! 10% limited stiff  Left lateral flexion 50% limited P! 10% limited stiff  Right rotation    Left rotation     (Blank rows = not tested)  LOWER EXTREMITY strength:     Strength in lbs Right eval Left eval R / L 3/3  Hip flexion 49.8 45.4 50.9 / 44.5  Hip extension     Hip abduction     Hip adduction 28.1 32.9 32.9 / 43.1  Hip internal rotation     Hip external rotation     Knee flexion     Knee extension 57.5 52.7   Ankle dorsiflexion     Ankle plantarflexion     Ankle inversion     Ankle eversion      (Blank rows = not tested)  LOWER EXTREMITY ROM:    Some limitation in hip ext and in/ex rotation due to muscle tightness  LUMBAR SPECIAL TESTS:  Slump test: Negative and SI Compression/distraction test: Negative  FUNCTIONAL TESTS:  5 times sit to stand: 19.27 From pool bench Timed up and go (TUG): 10.55 4 stage balance: passed 1&2.  Tandem stance 10s; SLS 7s  09/08/23: 5 STS 11.80  09/15/23: Tandem and SLS >20s  GAIT: Distance walked: 500 Assistive device utilized: None Level of assistance: Complete Independence Comments: Guarded UE, decreased arm swing  TREATMENT   09/22/23 Pt seen for aquatic therapy today.  Treatment took place in water 3.5-4.75 ft in depth at the [facility] pool. Temp of water was 91.  Pt entered/exited the pool via stairs using step-through pattern with  hand rail.   *walking 4.2 ft forward and backward with reciprocal arm swing *Farmers carry with yellow hand floats, cues for TrA hold throughout, marching forward and backward *side stepping with arm addct/ abdct with yellow hand floats x 4 laps *TrA set/noodle press solid noodle->yellow noodle to thighs in staggered stance x10 each LE forward *step ups leading R/L bottom step *Runners step  ups x 10 R/L Cycling on noodle     PATIENT EDUCATION:  Education details: exercise rationale, technique, modifications   Person educated: Patient Education method: Explanation Education comprehension: verbalized understanding  HOME EXERCISE PROGRAM: Access Code: [email or web address] URL: [email or web address] Date: 08/20/2023 Prepared by: [facility] - Outpatient Re[facility] - [address]  Exercises - Shoulder Extension with Resistance  - 3 x weekly - 1-2 sets - 10 reps - Standing 'L' Stretch at Counter  - 2 x daily - 7 x weekly - 2 reps - 10 seconds hold - Seated Hamstring Stretch  - 2 x daily - 7 x weekly - 2-3 reps - 15-20 hold - Seated Figure 4 Piriformis Stretch  - 2 x daily - 7 x weekly - 2 reps - 15-20 hold - Supine Lower Trunk Rotation  - 2 x daily - 7 x weekly - 3-5 reps - 5-10 hold - Supine Transversus Abdominis Bracing - Hands on Thighs  - 1 x daily - 7 x weekly - 10 reps - 5 hold   Land HEP  Access Code: [email or web address] URL: [email or web address] Date: 09/05/2023 Prepared by: Lorayne Bender  Exercises - Theracane Over Shoulder  - 1 x daily - 7 x weekly - 3 sets - 10  reps - Seated Cervical Rotation AROM  - 2-3 x daily - 7 x weekly - 3 reps - Gentle Levator Scapulae Stretch  - 1 x daily - 7 x weekly - 3 sets - 3 reps - 20 hold - Seated Upper Trapezius Stretch  - 1 x daily - 7 x weekly - 3 sets - 3 reps - 20 hold - Scapular Retraction with Resistance  - 1 x daily - 7 x weekly - 3 sets - 15 reps - Shoulder extension with resistance - Neutral  - 1 x daily - 7 x weekly - 3 sets - 15 reps - Supine Lower Trunk Rotation  - 1 x daily - 7 x weekly - 3 sets - 10 reps - Supine Piriformis Stretch with Foot on Ground  - 1 x daily - 7 x weekly - 3 sets - 3 reps - 20 hold - Seated Bilateral Shoulder Flexion Towel Slide at Table Top  - 1 x daily - 7 x weekly - 3 sets - 10 reps - Standing Glute Med Mobilization with Small Ball on Wall  - 1 x daily - 7 x weekly - 3 sets - 10  reps  Added aquatics FZ This aquatic home exercise program from [employer] utilizes pictures from land based exercises, but has been adapted prior to lamination and issuance.   - Side lunge with hand buoys  - 1 x daily - 1-3 x weekly - Seated Straddle on Flotation Forward Breast Stroke Arms and Bicycle Legs  - 1 x daily - 1-3 x weekly - Standing Hip Flexion Extension  - 1 x daily - 1-3 x weekly - 1-2 sets - 10 reps - Standing Hip Abduction  - 1 x daily - 1-3 x weekly - 1-2 sets - 10 reps - Noodle press  - 1 x daily - 1-3 x weekly - 1-2 sets - 10 reps  ASSESSMENT: Pt requesting DC.  States he feels he is doing well and wants to continue Indep. He reports compliance with land based hep and has access to pool at home [facility].  He has no difficulty getting his pants on or reaching behind his head. He is uncertain if he will continue with aquatic exercises but states if he does he will participate in water aerobic classes.  He declines laminated copy of HEP.  I did add aquatic exercises to his [employer] land based program as a well.  He has met all goals except his strength goal most likely to early dc.  He is dced today      OBJECTIVE IMPAIRMENTS: decreased activity tolerance, decreased balance, decreased mobility, difficulty walking, decreased strength, impaired flexibility, postural dysfunction, and pain.   ACTIVITY LIMITATIONS: bending, sitting, squatting, stairs, transfers, reach over head, and locomotion level  PARTICIPATION LIMITATIONS: community activity and yard work  PERSONAL FACTORS: Time since onset of injury/illness/exacerbation and 1-2 comorbidities: see PmHx  are also affecting patient's functional outcome.   REHAB POTENTIAL: Good  CLINICAL DECISION MAKING: Evolving/moderate complexity  EVALUATION COMPLEXITY: High   GOALS: Goals reviewed with patient? Yes  SHORT TERM GOALS: Target date: 08/25/23  Pt will tolerate full aquatic sessions consistently without increase in pain  and with improving function to demonstrate good toleration and effectiveness of intervention.  Baseline: Goal status: Met 08/25/23  2.  Pt will improve on 5 X STS test to <or= 17s (MCID) to demonstrate improving functional lower extremity strength, transitional movements, and balance Baseline: 19.27 Goal status: Met 09/08/23  3.  Pt will  complete SLS in 3.6 ft consistently @20s  or > Baseline: unable land based Goal status: In process 08/25/23;  Met 09/15/23  4.  Pt to report reduction in pain with aquatic intervention at <2/10 while submerged Baseline:  Goal status: Met 08/25/23  5.  Pt will have improve on Lumbar Side bending ROM by 50%. Baseline: see chart Goal status: In process 08/25/23; 09/15/23    LONG TERM GOALS: Target date: 10/03/23  Pt to meet stated Foto Goal Baseline:  Goal status: Met 09/09/23  2.  Pt will be indep with final HEP's (land and aquatic as appropriate) for continued management of condition Baseline:  Goal status: Met 09/22/23  3.  Pt will improve strength in hips and shoulders by at least 10 lbs to demonstrate improved overall physical function Baseline: see chart.  Shoulder strength in lbs TBA Goal status: In progress 09/15/23; improved but not met 09/22/23  4.  Pt will complete tandem and SLS x 20s to demonstrate improvement in balance ability. Baseline:  Goal status: Met 09/15/23  5.  Pt will report "normal" pain to be 2 or less/10 for improve mobility Baseline: 4 Goal status: IN PROGRESS - 09/01/23; 09/15/23 Met  6.  Pt will report donning pants without difficulty Baseline: difficult due to muscle tightness in LE Goal status: Met 09/22/23  PLAN:  PT FREQUENCY: 2x/week  PT DURATION: 8 weeks  PLANNED INTERVENTIONS: 97164- PT Re-evaluation, 97110-Therapeutic exercises, 97530- Therapeutic activity, 97112- Neuromuscular re-education, 97535- Self Care, 84132- Manual therapy, [id number]- Gait training, [phone]- Aquatic Therapy, 97014- Electrical stimulation  (unattended), [phone]- Ionotophoresis 4mg /ml Dexamethasone, Patient/Family education, Balance training, Stair training, Taping, Dry Needling, Joint mobilization, DME instructions, Cryotherapy, and Moist heat.  PLAN FOR NEXT SESSION: Aquatics f: HEP Land: body mechanics to lift wood; focus on hip strengthening, HEP  Rushie Chestnut) Nhung Danko MPT 09/22/23 8:54 AM [facility] [address] [place], [place], [address] Phone: [phone]   Fax:  [phone]      Referring diagnosis? LBP Treatment diagnosis? (if different than referring diagnosis) LBP AND shoulder pain What was this (referring dx) caused by? []  Surgery []  Fall [x]  Ongoing issue [x]  Arthritis []  Other: ____________  Laterality: []  Rt []  Lt [x]  Both  Check all possible CPT codes:  *CHOOSE 10 OR LESS*    See Planned Interventions listed in the Plan section of the Evaluation.

## 2023-09-24 ENCOUNTER — Ambulatory Visit (HOSPITAL_BASED_OUTPATIENT_CLINIC_OR_DEPARTMENT_OTHER): Payer: Medicare PPO | Admitting: Physical Therapy

## 2023-09-24 ENCOUNTER — Encounter (HOSPITAL_BASED_OUTPATIENT_CLINIC_OR_DEPARTMENT_OTHER): Payer: Self-pay

## 2023-09-29 ENCOUNTER — Ambulatory Visit (HOSPITAL_BASED_OUTPATIENT_CLINIC_OR_DEPARTMENT_OTHER): Payer: Medicare PPO | Admitting: Physical Therapy

## 2023-10-01 ENCOUNTER — Encounter (HOSPITAL_BASED_OUTPATIENT_CLINIC_OR_DEPARTMENT_OTHER): Payer: Medicare PPO | Admitting: Physical Therapy

## 2023-10-06 ENCOUNTER — Ambulatory Visit (HOSPITAL_BASED_OUTPATIENT_CLINIC_OR_DEPARTMENT_OTHER): Payer: Medicare PPO | Admitting: Physical Therapy

## 2023-10-07 ENCOUNTER — Other Ambulatory Visit: Payer: Self-pay | Admitting: Physical Medicine & Rehabilitation

## 2023-10-08 ENCOUNTER — Encounter (HOSPITAL_BASED_OUTPATIENT_CLINIC_OR_DEPARTMENT_OTHER): Payer: Medicare PPO | Admitting: Physical Therapy

## 2023-10-22 ENCOUNTER — Other Ambulatory Visit: Payer: Self-pay | Admitting: Physician Assistant

## 2023-10-22 DIAGNOSIS — Z79899 Other long term (current) drug therapy: Secondary | ICD-10-CM

## 2023-10-22 DIAGNOSIS — M1A09X0 Idiopathic chronic gout, multiple sites, without tophus (tophi): Secondary | ICD-10-CM

## 2023-10-22 NOTE — Addendum Note (Signed)
 Addended by: Henriette Combs on: 10/22/2023 09:03 AM   Modules accepted: Orders

## 2023-10-22 NOTE — Telephone Encounter (Signed)
 Last Fill: 07/22/2023  Labs: 06/16/2023 CBC WNL CMP WNL  Uric acid WNL  Next Visit: 11/12/2023  Last Visit: 06/16/2023  DX: Idiopathic chronic gout of multiple sites without tophus   Current Dose per office note 06/16/2023: allopurinol 300 mg 1 tablet by mouth daily   Okay to refill Allopurinol?

## 2023-10-29 NOTE — Progress Notes (Unsigned)
 Office Visit Note  Patient: Christopher Smith             Date of Birth: 1951-06-19           MRN: [id number]             PCP: Pete Brand, DO Referring: Pete Brand, DO Visit Date: 11/12/2023 Occupation: @GUAROCC @  Subjective:  Medication monitoring   History of Present Illness: OHM STARLIPER is a 73 y.o. male with history of chronic inflammatory arthritis and ulcerative colitis. Patient remains on Entyvio  IV infusions every 6 weeks as prescribed by gastroenterology.  Patient had an updated colonoscopy on 09/02/2023 at which time the recommendation was to increase the frequency of infusions from every 8 weeks to every 6 weeks.  He denies any recent signs or symptoms of an ulcerative colitis flare. Patient states that he has noticed a significant improvement in his joint pain and joint stiffness since establishing care at pain management.  Patient states that he was started on Cymbalta  30 mg 1 capsule daily which has provided significant relief.  He also had 3 injections in his lower back and completed physical therapy. Patient states that he discontinued methotrexate  about 2 months ago.  He was experiencing an aversion with weekly injections to the point that he would get nauseous prior to the injection.  He does not want to resume methotrexate  at this time.  He has not noticed any new or worsening symptoms since discontinuing methotrexate . He denies any recent or recurrent infections.  Activities of Daily Living:  Patient reports morning stiffness for 15 minutes.   Patient Denies nocturnal pain.  Difficulty dressing/grooming: Denies Difficulty climbing stairs: Denies Difficulty getting out of chair: Denies Difficulty using hands for taps, buttons, cutlery, and/or writing: Denies  Review of Systems  Constitutional:  Negative for fatigue.  HENT:  Negative for mouth sores and mouth dryness.   Eyes:  Negative for dryness.  Respiratory:  Negative for shortness of breath.    Cardiovascular:  Negative for chest pain and palpitations.  Gastrointestinal:  Positive for diarrhea. Negative for blood in stool and constipation.  Endocrine: Negative for increased urination.  Genitourinary:  Negative for involuntary urination.  Musculoskeletal:  Positive for joint pain, gait problem, joint pain, myalgias, morning stiffness and myalgias. Negative for joint swelling, muscle weakness and muscle tenderness.  Skin:  Positive for hair loss. Negative for color change, rash and sensitivity to sunlight.  Allergic/Immunologic: Negative for susceptible to infections.  Neurological:  Positive for dizziness. Negative for headaches.  Hematological:  Negative for swollen glands.  Psychiatric/Behavioral:  Positive for sleep disturbance. Negative for depressed mood. The patient is not nervous/anxious.     PMFS History:  Patient Active Problem List   Diagnosis Date Noted   Open fracture of one or more phalanges of hand 11/21/2017   Chronic inflammatory arthritis 08/05/2016   High risk medication use 08/05/2016   Contracture of elbow, right 08/05/2016   History of total knee replacement, bilateral 08/05/2016   Idiopathic chronic gout 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 tophus 08/05/2016   History of bilateral rotator cuff tear repair 08/05/2016   Gastroesophageal reflux  08/05/2016   Latent tuberculosis by blood test 08/05/2016   Ulcerative colitis (HCC) 09/03/2011   Glucose intolerance (impaired glucose tolerance) 09/03/2011   Severe diarrhea 09/03/2011   URI (upper respiratory infection) 07/02/2011   Bile salt-induced diarrhea 07/02/2011   S/P cholecystectomy 07/02/2011   Ulcerative colitis (HCC) 04/03/2011   Benign neoplasm of colon 04/03/2011   Positive  TB test 03/22/2011   Insomnia due to medical condition 03/22/2011   UC (ulcerative colitis) (HCC) 03/01/2011   Leg cramps 03/01/2011   Ulcerative colitis with rectal bleeding (HCC) 01/24/2011   Nonspecific abnormal finding in stool contents  01/24/2011   Heme positive stool 01/11/2011   Diarrhea 01/11/2011   Arthritis associated with inflammatory bowel disease 01/11/2011   Intentional weight loss 01/11/2011    Past Medical History:  Diagnosis Date   Allergy     Anal fissure    Arthritis    RA   Blood transfusion without reported diagnosis    COVID-19 06/2020   Diabetes mellitus    no meds taken 05-01-16- elevated glucose was from predisone for UC   Diarrhea    Fatty liver    GERD (gastroesophageal reflux disease)    Gout    Hiatal hernia    Hypertension    Internal hemorrhoids    Polyarthritis    PONV (postoperative nausea and vomiting)    Sessile rectal polyp 03/27/2011   Sinusitis    Tuberculosis    positive PPD- had an allergic reaction to the PPD, gets yearly chest xray   Tubular adenoma of colon    Ulcerative colitis 01/24/2011    Family History  Problem Relation Age of Onset   Alzheimer's disease Father    Stroke Mother    Cancer Mother        liver & breast    Breast cancer Mother    Liver cancer Mother    Rheum arthritis Brother    Rheum arthritis Brother    Rheum arthritis Sister    Rheum arthritis Sister    Breast cancer Sister    Rheum arthritis Sister    Breast cancer Sister    Colon cancer Neg Hx    Esophageal cancer Neg Hx    Stomach cancer Neg Hx    Rectal cancer Neg Hx    Colon polyps Neg Hx    Past Surgical History:  Procedure Laterality Date   CHOLECYSTECTOMY     COLONOSCOPY     KNEE SURGERY     Bilateral    POLYPECTOMY     SHOULDER SURGERY Bilateral    Bilateral - rotator cuff repair    TOTAL KNEE ARTHROPLASTY Bilateral    Social History   Social History Narrative   1 caffeine drink daily       2 sons and a step daughter   Immunization History  Administered Date(s) Administered   Influenza, High Dose Seasonal PF 04/28/2019   Moderna Sars-Covid-2 Vaccination 09/08/2019, 10/06/2019   Tdap 11/19/2017     Objective: Vital Signs: BP 131/73 (BP Location: Left Arm,  Patient Position: Sitting, Cuff Size: Normal)   Pulse 60   Resp 16   Ht 5\' 11"  (1.803 m)   Wt 220 lb (99.8 kg)   BMI 30.68 kg/m    Physical Exam Vitals and nursing note reviewed.  Constitutional:      Appearance: He is well-developed.  HENT:     Head: Normocephalic and atraumatic.  Eyes:     Conjunctiva/sclera: Conjunctivae normal.     Pupils: Pupils are equal, round, and reactive to light.  Cardiovascular:     Rate and Rhythm: Normal rate and regular rhythm.     Heart sounds: Normal heart sounds.  Pulmonary:     Effort: Pulmonary effort is normal.     Breath sounds: Normal breath sounds.  Abdominal:     General: Bowel sounds are normal.  Palpations: Abdomen is soft.  Musculoskeletal:     Cervical back: Normal range of motion and neck supple.  Skin:    General: Skin is warm and dry.     Capillary Refill: Capillary refill takes less than 2 seconds.  Neurological:     Mental Status: He is alert and oriented to person, place, and time.  Psychiatric:        Behavior: Behavior normal.      Musculoskeletal Exam: C-spine has limited range of motion with lateral rotation.  Slightly limited mobility of the lumbar spine.  Some tenderness in the paraspinal muscles in the right lower lumbar region. No SI joint tenderness.  Left shoulder has slightly limited range of motion especially with internal rotation.  Right shoulder is full range of motion.  Right elbow flexion contracture noted.  CMC, PIP, DIP thickening noted.  No synovitis apparent.  Complete fist formation.  Hip joints have good range of motion with no groin pain.  Bilateral knee replacements have good range of motion with some swelling in the left knee.  Ankle joints have good range of motion with no tenderness or joint swelling.  CDAI Exam: CDAI Score: -- Patient Global: --; Provider Global: -- Swollen: --; Tender: -- Joint Exam 11/12/2023   No joint exam has been documented for this visit   There is currently no  information documented on the homunculus. Go to the Rheumatology activity and complete the homunculus joint exam.  Investigation: No additional findings.  Imaging: No results found.  Recent Labs: Lab Results  Component Value Date   WBC 8.2 06/16/2023   HGB 14.6 06/16/2023   PLT 265 06/16/2023   NA 139 06/16/2023   K 4.8 06/16/2023   CL 102 06/16/2023   CO2 30 06/16/2023   GLUCOSE 105 (H) 06/16/2023   BUN 16 06/16/2023   CREATININE 0.86 06/16/2023   BILITOT 0.8 06/16/2023   ALKPHOS 65 11/19/2016   AST 26 06/16/2023   ALT 31 06/16/2023   PROT 7.5 06/16/2023   ALBUMIN 4.4 11/19/2016   CALCIUM 9.4 06/16/2023   GFRAA 103 01/16/2021   QFTBGOLDPLUS CANCELED 10/10/2021    Speciality Comments: Last Dexa Scan 03/21/2020  Prior therapy: Humira , Remicaide, Cimzia , Stelara  (inadequate response)  Procedures:  No procedures performed Allergies: Patient has no known allergies.     Assessment / Plan:     Visit Diagnoses: Chronic inflammatory arthritis - Inflammatory, erosive: He has no synovitis or dactylitis on examination today.  He experiences intermittent joint stiffness but overall his pain levels have been significantly better controlled since establishing care with pain management.  He has been taking Cymbalta  30 mg 1 capsule daily which has improved his pain and mood.  Patient has also noticed improvement in his lower back pain since undergoing injections as well as completing physical therapy.  He has not been experiencing any nocturnal pain or difficulty performing ADLs. He discontinued methotrexate  2 months ago due to developing an aversion with the weekly injections.  He has not noticed any new or worsening symptoms in discontinuing methotrexate .  Offered to reinitiate oral methotrexate  but he declined at this time.  He will notify us  if he develops any new or worsening symptoms.  He will follow-up in the office in 5 months or sooner if needed.  High risk medication use  -Entyvio  IV infusions every 6 weeks by GI-resumed 10/09/22. Increased the frequency of infusions from every 8 weeks to every 6 weeks after colonoscopy on 09/02/23.  Previously held MTX August  2023 until March 2024.  Discontinued injectable methotrexate  about 2 months ago due to developing an aversion with the weekly injections.  Offered to send a prescription for oral methotrexate  but he has declined at this time. CBC and CMP updated on 09/04/2023: White blood cell count 8.0, red blood cell count 4.88, hemoglobin 15.1, platelet count 294K, creatinine 0.80, GFR 36, AST 31, ALT 32, hemoglobin A1c 6.4%.    Chronic right SI joint pain - Right SI joint cortisone injection performed on 10/30/2022.  His lower back pain has improved significantly since establishing care with pain management.  He completed physical therapy which was also helpful.  Positive TB test: Chest x-ray in 06/03/2022 revealed no active cardiopulmonary disease.  Other ulcerative colitis with other complication (HCC) - Under the care of Dr. Bridgett Camps. Diagnosis 2011, prior treatment with infliximab , adalimumab , Cimzia  and Stelara :  Currently on Entyvio  since April 2023.  He had an updated colonoscopy on 09/02/2023 which revealed active inflammation.  The frequency of Entyvio  infusions was increased from every 8 weeks to every 6 weeks at that time.  His symptoms have been well-controlled. He discontinued methotrexate  about 2 months ago due to an aversion with the weekly injections.  Offered to send a refill of oral methotrexate  but he has declined at this time.  He will notify us  if he develops any new or worsening symptoms.  Idiopathic chronic gout of multiple sites without tophus -He has not had any signs or symptoms of a gout flare.  He has clinically been doing well taking allopurinol  300 mg 1 tablet by mouth daily. Uric acid updated by PCP per patient--upon reviewing lab work obtained on 09/04/2023 no uric acid was drawn.  Plan to place a future  order for uric acid to be drawn with his next lab work.  History of bilateral rotator cuff tear repair: Intermittent discomfort in the left shoulder.   Contracture of right elbow: Unchanged.  No tenderness or inflammation along the joint line.  Primary osteoarthritis of both hands: He has severe CMC, PIP, DIP thickening consistent with osteoarthritis of both hands.  He experiences intermittent stiffness but overall his pain level has improved since initiating Cymbalta  30 mg 1 capsule daily by pain management.  History of total bilateral knee replacement: He has good range of motion of both knee replacements. Small effusion in the left knee replacement noted.   Other medical conditions are listed as follows:   History of gastroesophageal reflux (GERD)  Orders: No orders of the defined types were placed in this encounter.  No orders of the defined types were placed in this encounter.    Follow-Up Instructions: Return in about 5 months (around 04/13/2024) for Inflammatory arthritis .   Romayne Clubs, PA-C  Note - This record has been created using Dragon software.  Chart creation errors have been sought, but may not always  have been located. Such creation errors do not reflect on  the standard of medical care.

## 2023-11-11 ENCOUNTER — Ambulatory Visit (INDEPENDENT_AMBULATORY_CARE_PROVIDER_SITE_OTHER): Payer: Medicare PPO

## 2023-11-11 VITALS — BP 140/77 | HR 59 | Temp 97.5°F | Resp 16 | Ht 71.0 in | Wt 218.8 lb

## 2023-11-11 DIAGNOSIS — K51011 Ulcerative (chronic) pancolitis with rectal bleeding: Secondary | ICD-10-CM

## 2023-11-11 DIAGNOSIS — M1990 Unspecified osteoarthritis, unspecified site: Secondary | ICD-10-CM

## 2023-11-11 DIAGNOSIS — K5100 Ulcerative (chronic) pancolitis without complications: Secondary | ICD-10-CM

## 2023-11-11 MED ORDER — VEDOLIZUMAB 300 MG IV SOLR
300.0000 mg | Freq: Once | INTRAVENOUS | Status: AC
  Administered 2023-11-11: 300 mg via INTRAVENOUS
  Filled 2023-11-11: qty 5

## 2023-11-11 NOTE — Progress Notes (Signed)
 Diagnosis: Ulcerative Colitis  Provider:  Mannam, Praveen MD  Procedure: IV Infusion  IV Type: Peripheral, IV Location: L Antecubital  Entyvio  (Vedolizumab ), Dose: 300 mg  Infusion Start Time: 0840  Infusion Stop Time: 0914  Post Infusion IV Care: Peripheral IV Discontinued  Discharge: Condition: Good, Destination: Home . AVS Declined  Performed by:  Lendel Quant, RN

## 2023-11-12 ENCOUNTER — Encounter: Payer: Self-pay | Admitting: Physician Assistant

## 2023-11-12 ENCOUNTER — Ambulatory Visit: Payer: Medicare PPO | Attending: Physician Assistant | Admitting: Physician Assistant

## 2023-11-12 ENCOUNTER — Telehealth: Payer: Self-pay | Admitting: *Deleted

## 2023-11-12 VITALS — BP 131/73 | HR 60 | Resp 16 | Ht 71.0 in | Wt 220.0 lb

## 2023-11-12 DIAGNOSIS — M533 Sacrococcygeal disorders, not elsewhere classified: Secondary | ICD-10-CM | POA: Diagnosis not present

## 2023-11-12 DIAGNOSIS — K51818 Other ulcerative colitis with other complication: Secondary | ICD-10-CM | POA: Diagnosis not present

## 2023-11-12 DIAGNOSIS — M19041 Primary osteoarthritis, right hand: Secondary | ICD-10-CM

## 2023-11-12 DIAGNOSIS — R7611 Nonspecific reaction to tuberculin skin test without active tuberculosis: Secondary | ICD-10-CM

## 2023-11-12 DIAGNOSIS — G8929 Other chronic pain: Secondary | ICD-10-CM

## 2023-11-12 DIAGNOSIS — M1990 Unspecified osteoarthritis, unspecified site: Secondary | ICD-10-CM

## 2023-11-12 DIAGNOSIS — M19042 Primary osteoarthritis, left hand: Secondary | ICD-10-CM

## 2023-11-12 DIAGNOSIS — M1A09X0 Idiopathic chronic gout, multiple sites, without tophus (tophi): Secondary | ICD-10-CM

## 2023-11-12 DIAGNOSIS — Z96653 Presence of artificial knee joint, bilateral: Secondary | ICD-10-CM | POA: Diagnosis not present

## 2023-11-12 DIAGNOSIS — M12819 Other specific arthropathies, not elsewhere classified, unspecified shoulder: Secondary | ICD-10-CM

## 2023-11-12 DIAGNOSIS — M24521 Contracture, right elbow: Secondary | ICD-10-CM

## 2023-11-12 DIAGNOSIS — Z79899 Other long term (current) drug therapy: Secondary | ICD-10-CM | POA: Diagnosis not present

## 2023-11-12 DIAGNOSIS — Z8719 Personal history of other diseases of the digestive system: Secondary | ICD-10-CM

## 2023-11-12 NOTE — Telephone Encounter (Signed)
 Labs received from:[facility]  Drawn on:09/04/2023  Reviewed by: Dr. Nicholas Bari   Labs drawn:CBC, CMP, Hgb A1C  Results: Glucose 105    Hgb A1C 6.4

## 2023-11-14 ENCOUNTER — Ambulatory Visit: Payer: Medicare PPO | Admitting: Physician Assistant

## 2023-11-17 ENCOUNTER — Other Ambulatory Visit: Payer: Self-pay | Admitting: Internal Medicine

## 2023-11-17 DIAGNOSIS — K219 Gastro-esophageal reflux disease without esophagitis: Secondary | ICD-10-CM

## 2023-11-26 ENCOUNTER — Other Ambulatory Visit: Payer: Self-pay | Admitting: Physical Medicine & Rehabilitation

## 2023-11-28 ENCOUNTER — Telehealth: Payer: Self-pay | Admitting: Pharmacy Technician

## 2023-11-28 ENCOUNTER — Other Ambulatory Visit: Payer: Self-pay

## 2023-11-28 NOTE — Telephone Encounter (Signed)
 Entyvio  has been increased to 300mg  q6wks. Called to verify no new Siegfried Dress is required. Based on medical necessity.  RepPepper Boyer REF: [id number].   Auth Submission: APPROVED Site of care: Site of care: [facility] Payer: [employer] Medication & CPT/J Code(s) submitted: Entyvio  (Vedolizumab ) J3380 Route of submission (phone, fax, portal):  Phone # Fax # Auth type: Buy/Bill PB Units/visits requested: 300MG  Q6WKS Reference number: [id number] Approval from: 07/16/23 to 07/14/24

## 2023-12-15 ENCOUNTER — Other Ambulatory Visit: Payer: Self-pay | Admitting: Internal Medicine

## 2023-12-15 DIAGNOSIS — K219 Gastro-esophageal reflux disease without esophagitis: Secondary | ICD-10-CM

## 2023-12-22 ENCOUNTER — Telehealth: Payer: Self-pay | Admitting: Physical Medicine & Rehabilitation

## 2023-12-22 NOTE — Telephone Encounter (Signed)
 Patient called in states he needs a refill on DULoxetine  (CYMBALTA ) 30 MG capsule , only has 1 pill left - patient also asked if he should schedule a follow up

## 2023-12-23 ENCOUNTER — Ambulatory Visit

## 2023-12-23 MED ORDER — DULOXETINE HCL 30 MG PO CPEP: 30.0000 mg | ORAL_CAPSULE | Freq: Every day | ORAL | 0 refills | Status: DC

## 2023-12-24 ENCOUNTER — Ambulatory Visit (INDEPENDENT_AMBULATORY_CARE_PROVIDER_SITE_OTHER)

## 2023-12-24 VITALS — BP 130/72 | HR 47 | Temp 97.5°F | Resp 16 | Ht 71.0 in | Wt 220.0 lb

## 2023-12-24 DIAGNOSIS — M1990 Unspecified osteoarthritis, unspecified site: Secondary | ICD-10-CM

## 2023-12-24 DIAGNOSIS — K51011 Ulcerative (chronic) pancolitis with rectal bleeding: Secondary | ICD-10-CM

## 2023-12-24 DIAGNOSIS — K5100 Ulcerative (chronic) pancolitis without complications: Secondary | ICD-10-CM | POA: Diagnosis not present

## 2023-12-24 MED ORDER — VEDOLIZUMAB 300 MG IV SOLR
300.0000 mg | Freq: Once | INTRAVENOUS | Status: AC
  Administered 2023-12-24: 300 mg via INTRAVENOUS
  Filled 2023-12-24: qty 5

## 2023-12-24 NOTE — Progress Notes (Signed)
 Diagnosis: Ulcerative Colitis  Provider:  Phyllis Breeze MD  Procedure: IV Infusion  IV Type: Peripheral, IV Location: L Antecubital  Entyvio  (Vedolizumab ), Dose: 300 mg  Infusion Start Time: 1330  Infusion Stop Time: 1404  Post Infusion IV Care: Peripheral IV Discontinued  Discharge: Condition: Good, Destination: Home . AVS Declined  Performed by:  Star East, LPN

## 2024-01-22 ENCOUNTER — Encounter: Attending: Registered Nurse | Admitting: Registered Nurse

## 2024-01-22 ENCOUNTER — Encounter: Payer: Self-pay | Admitting: Registered Nurse

## 2024-01-22 VITALS — BP 129/80 | HR 67 | Ht 71.0 in | Wt 221.0 lb

## 2024-01-22 DIAGNOSIS — M47817 Spondylosis without myelopathy or radiculopathy, lumbosacral region: Secondary | ICD-10-CM | POA: Insufficient documentation

## 2024-01-22 DIAGNOSIS — G8929 Other chronic pain: Secondary | ICD-10-CM | POA: Insufficient documentation

## 2024-01-22 DIAGNOSIS — G894 Chronic pain syndrome: Secondary | ICD-10-CM | POA: Insufficient documentation

## 2024-01-22 DIAGNOSIS — M546 Pain in thoracic spine: Secondary | ICD-10-CM | POA: Insufficient documentation

## 2024-01-22 MED ORDER — DULOXETINE HCL 30 MG PO CPEP: 30.0000 mg | ORAL_CAPSULE | Freq: Every day | ORAL | 5 refills | Status: DC

## 2024-01-22 NOTE — Progress Notes (Signed)
 Subjective:    Patient ID: Christopher Smith, male    DOB: 01-10-1951, 73 y.o.   MRN: [id number]  HPI: OCTAVIOUS Smith is a 73 y.o. male who returns for follow up appointment for chronic pain and medication refill. He states his  pain is located in his mid- back mainly right side. He rates his pain 4. His current exercise regime is walking and performing stretching exercises.    Pain Inventory Average Pain 2 Pain Right Now 4 My pain is sharp  In the last 24 hours, has pain interfered with the following? General activity 8 Relation with others 0 Enjoyment of life 8 What TIME of day is your pain at its worst? night Sleep (in general) NA  Pain is worse with: bending Pain improves with: rest Relief from Meds: 8  Family History  Problem Relation Age of Onset   Alzheimer's disease Father    Stroke Mother    Cancer Mother        liver & breast    Breast cancer Mother    Liver cancer Mother    Rheum arthritis Brother    Rheum arthritis Brother    Rheum arthritis Sister    Rheum arthritis Sister    Breast cancer Sister    Rheum arthritis Sister    Breast cancer Sister    Colon cancer Neg Hx    Esophageal cancer Neg Hx    Stomach cancer Neg Hx    Rectal cancer Neg Hx    Colon polyps Neg Hx    Social History   Socioeconomic History   Marital status: Married    Spouse name: Not on file   Number of children: 3   Years of education: Not on file   Highest education level: Not on file  Occupational History   Occupation: Press photographer: [employer]    Comment: retired  Tobacco Use   Smoking status: Former    Current packs/day: 0.00    Types: Cigarettes    Quit date: 07/15/1990    Years since quitting: 33.5    Passive exposure: Never   Smokeless tobacco: Former    Types: Engineer, drilling   Vaping status: Never Used  Substance and Sexual Activity   Alcohol use: Yes    Comment: social   Drug use: No   Sexual activity: Not on file  Other Topics  Concern   Not on file  Social History Narrative   1 caffeine drink daily       2 sons and a step daughter   Social Drivers of Corporate investment banker Strain: Not on file  Food Insecurity: Not on file  Transportation Needs: Not on file  Physical Activity: Not on file  Stress: Not on file  Social Connections: Not on file   Past Surgical History:  Procedure Laterality Date   CHOLECYSTECTOMY     COLONOSCOPY     KNEE SURGERY     Bilateral    POLYPECTOMY     SHOULDER SURGERY Bilateral    Bilateral - rotator cuff repair    TOTAL KNEE ARTHROPLASTY Bilateral    Past Surgical History:  Procedure Laterality Date   CHOLECYSTECTOMY     COLONOSCOPY     KNEE SURGERY     Bilateral    POLYPECTOMY     SHOULDER SURGERY Bilateral    Bilateral - rotator cuff repair    TOTAL KNEE ARTHROPLASTY Bilateral    Past  Medical History:  Diagnosis Date   Allergy     Anal fissure    Arthritis    RA   Blood transfusion without reported diagnosis    COVID-19 06/2020   Diabetes mellitus    no meds taken 05-01-16- elevated glucose was from predisone for UC   Diarrhea    Fatty liver    GERD (gastroesophageal reflux disease)    Gout    Hiatal hernia    Hypertension    Internal hemorrhoids    Polyarthritis    PONV (postoperative nausea and vomiting)    Sessile rectal polyp 03/27/2011   Sinusitis    Tuberculosis    positive PPD- had an allergic reaction to the PPD, gets yearly chest xray   Tubular adenoma of colon    Ulcerative colitis 01/24/2011   BP (!) 144/77   Pulse 67   Ht 5' 11 (1.803 m)   Wt 221 lb (100.2 kg)   SpO2 94%   BMI 30.82 kg/m   Opioid Risk Score:   Fall Risk Score:  `1  Depression screen [facility] 2/9     08/12/2023   12:48 PM 07/17/2023    9:18 AM 07/03/2023   11:04 AM 05/22/2023    9:01 AM  Depression screen PHQ 2/9  Decreased Interest 0 3 3 0  Down, Depressed, Hopeless 0 0 0 0  PHQ - 2 Score 0 3 3 0  Altered sleeping  3 3   Tired, decreased energy  3 3    Change in appetite  0 0   Feeling bad or failure about yourself   0 0   Trouble concentrating  0 0   Moving slowly or fidgety/restless  1 1   Suicidal thoughts  0 0   PHQ-9 Score  10 10   Difficult doing work/chores  Not difficult at all Not difficult at all      Review of Systems  Musculoskeletal:  Positive for back pain.  All other systems reviewed and are negative.      Objective:   Physical Exam Vitals and nursing note reviewed.  Constitutional:      Appearance: Normal appearance.  Cardiovascular:     Rate and Rhythm: Normal rate and regular rhythm.     Pulses: Normal pulses.     Heart sounds: Normal heart sounds.  Pulmonary:     Effort: Pulmonary effort is normal.     Breath sounds: Normal breath sounds.  Musculoskeletal:     Comments: Normal Muscle Bulk and Muscle Testing Reveals:  Upper Extremities: Full ROM and Muscle Strength 5/5  Thoracic Paraspinal Tenderness: T-4- T-6 Mainly Right Side  Lower Extremities: Full ROM and Muscle Strength 5/5 Arises from Table with ease Narrow Based  Gait      Skin:    General: Skin is warm and dry.  Neurological:     Mental Status: He is alert and oriented to person, place, and time.  Psychiatric:        Mood and Affect: Mood normal.        Behavior: Behavior normal.           Assessment & Plan:  Chronic Right Sided Thoracic back Pain: Encouraged to increase HEP as Tolerated. He verbalizes understanding.  Lumbosacral Spondylosis without Myelopathy: S/ P: on 08/12/2023: with good relief noted.  Right lumbar L3, L4 medial branch blocks and L5 dorsal ramus injection under fluoroscopic guidance   Indication: Right Lumbar pain which is not relieved by medication management or other conservative  care and interfering with self-care and mobility.  3. Chronic Pain Syndrome: Continue Duloxetine .   F/U with Dr Carilyn in 6 months

## 2024-02-04 ENCOUNTER — Ambulatory Visit (INDEPENDENT_AMBULATORY_CARE_PROVIDER_SITE_OTHER)

## 2024-02-04 VITALS — BP 164/72 | HR 63 | Temp 97.6°F | Resp 16 | Ht 71.0 in | Wt 222.0 lb

## 2024-02-04 DIAGNOSIS — K5100 Ulcerative (chronic) pancolitis without complications: Secondary | ICD-10-CM | POA: Diagnosis not present

## 2024-02-04 DIAGNOSIS — K51011 Ulcerative (chronic) pancolitis with rectal bleeding: Secondary | ICD-10-CM

## 2024-02-04 DIAGNOSIS — M1990 Unspecified osteoarthritis, unspecified site: Secondary | ICD-10-CM

## 2024-02-04 MED ORDER — VEDOLIZUMAB 300 MG IV SOLR
300.0000 mg | Freq: Once | INTRAVENOUS | Status: AC
  Administered 2024-02-04: 300 mg via INTRAVENOUS
  Filled 2024-02-04: qty 5

## 2024-02-04 NOTE — Progress Notes (Signed)
 Diagnosis: Ulcerative Colitis  Provider:  Lonna Coder MD  Procedure: IV Infusion  IV Type: Peripheral, IV Location: L Antecubital  Entyvio  (Vedolizumab ), Dose: 300 mg  Infusion Start Time: 0834  Infusion Stop Time: 0909  Post Infusion IV Care: Peripheral IV Discontinued  Discharge: Condition: Good, Destination: Home . AVS Declined  Performed by:  Elfego Giammarino, RN

## 2024-02-26 ENCOUNTER — Other Ambulatory Visit: Payer: Self-pay | Admitting: Physician Assistant

## 2024-02-26 NOTE — Telephone Encounter (Signed)
 Last Fill: 10/22/2023  Labs: 09/04/2023 CBC w/ Diff WNL EGFR 93.0, A1C 6.4 06/16/2023 Uric acid 4.8  I Left a VM reminding the Patient that he will needs labs soon with our lab hours.  Next Visit: 04/15/2024  Last Visit: 11/12/2023  DX: Chronic inflammatory arthritis  Current Dose per office note 11/12/2023:Allopurinol  300 mg 1 tablet by mouth daily.   Okay to refill Allopurinol ?

## 2024-03-10 DIAGNOSIS — R946 Abnormal results of thyroid function studies: Secondary | ICD-10-CM | POA: Diagnosis not present

## 2024-03-10 DIAGNOSIS — E785 Hyperlipidemia, unspecified: Secondary | ICD-10-CM | POA: Diagnosis not present

## 2024-03-10 DIAGNOSIS — I10 Essential (primary) hypertension: Secondary | ICD-10-CM | POA: Diagnosis not present

## 2024-03-10 DIAGNOSIS — Z125 Encounter for screening for malignant neoplasm of prostate: Secondary | ICD-10-CM | POA: Diagnosis not present

## 2024-03-10 DIAGNOSIS — M109 Gout, unspecified: Secondary | ICD-10-CM | POA: Diagnosis not present

## 2024-03-10 DIAGNOSIS — R7303 Prediabetes: Secondary | ICD-10-CM | POA: Diagnosis not present

## 2024-03-10 DIAGNOSIS — Z79899 Other long term (current) drug therapy: Secondary | ICD-10-CM | POA: Diagnosis not present

## 2024-03-11 LAB — LAB REPORT - SCANNED
EGFR: 91
TSH: 2.21

## 2024-03-16 DIAGNOSIS — H43393 Other vitreous opacities, bilateral: Secondary | ICD-10-CM | POA: Diagnosis not present

## 2024-03-16 DIAGNOSIS — H5203 Hypermetropia, bilateral: Secondary | ICD-10-CM | POA: Diagnosis not present

## 2024-03-16 DIAGNOSIS — R7303 Prediabetes: Secondary | ICD-10-CM | POA: Diagnosis not present

## 2024-03-16 DIAGNOSIS — H52203 Unspecified astigmatism, bilateral: Secondary | ICD-10-CM | POA: Diagnosis not present

## 2024-03-16 DIAGNOSIS — H524 Presbyopia: Secondary | ICD-10-CM | POA: Diagnosis not present

## 2024-03-16 DIAGNOSIS — H2513 Age-related nuclear cataract, bilateral: Secondary | ICD-10-CM | POA: Diagnosis not present

## 2024-03-17 DIAGNOSIS — Z9109 Other allergy status, other than to drugs and biological substances: Secondary | ICD-10-CM | POA: Diagnosis not present

## 2024-03-17 DIAGNOSIS — M1380 Other specified arthritis, unspecified site: Secondary | ICD-10-CM | POA: Diagnosis not present

## 2024-03-17 DIAGNOSIS — I10 Essential (primary) hypertension: Secondary | ICD-10-CM | POA: Diagnosis not present

## 2024-03-17 DIAGNOSIS — K5190 Ulcerative colitis, unspecified, without complications: Secondary | ICD-10-CM | POA: Diagnosis not present

## 2024-03-17 DIAGNOSIS — R7303 Prediabetes: Secondary | ICD-10-CM | POA: Diagnosis not present

## 2024-03-17 DIAGNOSIS — Z0000 Encounter for general adult medical examination without abnormal findings: Secondary | ICD-10-CM | POA: Diagnosis not present

## 2024-03-18 ENCOUNTER — Ambulatory Visit (INDEPENDENT_AMBULATORY_CARE_PROVIDER_SITE_OTHER)

## 2024-03-18 VITALS — BP 117/69 | HR 66 | Temp 97.5°F | Resp 18 | Ht 71.0 in | Wt 220.0 lb

## 2024-03-18 DIAGNOSIS — K5100 Ulcerative (chronic) pancolitis without complications: Secondary | ICD-10-CM

## 2024-03-18 DIAGNOSIS — M1990 Unspecified osteoarthritis, unspecified site: Secondary | ICD-10-CM

## 2024-03-18 DIAGNOSIS — K51011 Ulcerative (chronic) pancolitis with rectal bleeding: Secondary | ICD-10-CM

## 2024-03-18 MED ORDER — VEDOLIZUMAB 300 MG IV SOLR
300.0000 mg | Freq: Once | INTRAVENOUS | Status: AC
  Administered 2024-03-18: 300 mg via INTRAVENOUS
  Filled 2024-03-18: qty 5

## 2024-03-18 NOTE — Progress Notes (Signed)
 Diagnosis: Ulcerative Colitis  Provider:  Praveen Mannam MD  Procedure: IV Infusion  IV Type: Peripheral, IV Location: R Hand  Entyvio  (Vedolizumab ), Dose: 300 mg  Infusion Start Time: 0829  Infusion Stop Time: 0906  Post Infusion IV Care: Peripheral IV Discontinued  Discharge: Condition: Good, Destination: Home . AVS Declined  Performed by:  Nuha Degner, RN

## 2024-04-01 NOTE — Progress Notes (Signed)
 Office Visit Note  Patient: Christopher Smith             Date of Birth: 1951-03-05           MRN: [id number]             PCP: Gerome Brunet, DO Referring: Gerome Brunet, DO Visit Date: 04/15/2024 Occupation: Data Unavailable  Subjective:  Medication monitoring    History of Present Illness: Christopher Smith is a 73 y.o. male with history of chronic inflammatory arthritis and ulcerative colitis. He received an entyvio  infusion on 03/18/24 and continues to receive these infusions every 8 weeks.  He denies any gaps in therapy.  He denies any recent or recurrent infections.  Patient reports that overall his symptoms from ulcerative colitis have been stable.  He has not noticed any new or worsening symptoms since discontinuing methotrexate .  He experiences intermittent aching on the right side of his lower back especially at night.  He remains under the care of pain management.  He has had 1 injection but is considering having a repeat injection in the future if his symptoms persist or worsen.  Patient reports that this week he has been doing some tile work requiring some repetitive and overuse with his hands.  He is having some increase stiffness in both hands but denies any joint swelling.  Overall he feels that his pain levels have been significantly better controlled while taking Cymbalta  30 mg 1 capsule daily. He denies any signs or symptoms of a gout flare.  He remains on allopurinol  300 mg daily.  Activities of Daily Living:  Patient reports morning stiffness for 5 minutes.   Patient Reports nocturnal pain.  Difficulty dressing/grooming: Denies Difficulty climbing stairs: Denies Difficulty getting out of chair: Reports Difficulty using hands for taps, buttons, cutlery, and/or writing: Reports  Review of Systems  Constitutional:  Negative for fatigue.  HENT:  Negative for mouth sores and mouth dryness.   Eyes:  Negative for dryness.  Respiratory:  Negative for shortness of breath.    Cardiovascular:  Negative for chest pain and palpitations.  Gastrointestinal:  Positive for diarrhea. Negative for blood in stool and constipation.  Endocrine: Positive for increased urination.  Genitourinary:  Negative for involuntary urination.  Musculoskeletal:  Positive for joint pain, gait problem, joint pain, myalgias, morning stiffness and myalgias. Negative for joint swelling, muscle weakness and muscle tenderness.  Skin:  Negative for color change, rash, hair loss and sensitivity to sunlight.  Allergic/Immunologic: Negative for susceptible to infections.  Neurological:  Negative for dizziness and headaches.  Hematological:  Negative for swollen glands.  Psychiatric/Behavioral:  Positive for sleep disturbance. Negative for depressed mood. The patient is not nervous/anxious.     PMFS History:  Patient Active Problem List   Diagnosis Date Noted   Open fracture of one or more phalanges of hand 11/21/2017   Chronic inflammatory arthritis 08/05/2016   High risk medication use 08/05/2016   Contracture of elbow, right 08/05/2016   History of total knee replacement, bilateral 08/05/2016   Idiopathic chronic gout 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 tophus 08/05/2016   History of bilateral rotator cuff tear repair 08/05/2016   Gastroesophageal reflux  08/05/2016   Latent tuberculosis by blood test 08/05/2016   Ulcerative colitis (HCC) 09/03/2011   Glucose intolerance (impaired glucose tolerance) 09/03/2011   Severe diarrhea 09/03/2011   URI (upper respiratory infection) 07/02/2011   Bile salt-induced diarrhea 07/02/2011   S/P cholecystectomy 07/02/2011   Ulcerative colitis (HCC) 04/03/2011   Benign  neoplasm of colon 04/03/2011   Positive TB test 03/22/2011   Insomnia due to medical condition 03/22/2011   UC (ulcerative colitis) (HCC) 03/01/2011   Leg cramps 03/01/2011   Ulcerative colitis with rectal bleeding (HCC) 01/24/2011   Nonspecific abnormal finding in stool contents 01/24/2011   Heme  positive stool 01/11/2011   Diarrhea 01/11/2011   Arthritis associated with inflammatory bowel disease 01/11/2011   Intentional weight loss 01/11/2011    Past Medical History:  Diagnosis Date   Allergy     Anal fissure    Arthritis    RA   Blood transfusion without reported diagnosis    COVID-19 06/2020   Diabetes mellitus    no meds taken 05-01-16- elevated glucose was from predisone for UC   Diarrhea    Fatty liver    GERD (gastroesophageal reflux disease)    Gout    Hiatal hernia    Hypertension    Internal hemorrhoids    Polyarthritis    PONV (postoperative nausea and vomiting)    Sessile rectal polyp 03/27/2011   Sinusitis    Tuberculosis    positive PPD- had an allergic reaction to the PPD, gets yearly chest xray   Tubular adenoma of colon    Ulcerative colitis 01/24/2011    Family History  Problem Relation Age of Onset   Alzheimer's disease Father    Stroke Mother    Cancer Mother        liver & breast    Breast cancer Mother    Liver cancer Mother    Rheum arthritis Brother    Rheum arthritis Brother    Rheum arthritis Sister    Rheum arthritis Sister    Breast cancer Sister    Rheum arthritis Sister    Breast cancer Sister    Colon cancer Neg Hx    Esophageal cancer Neg Hx    Stomach cancer Neg Hx    Rectal cancer Neg Hx    Colon polyps Neg Hx    Past Surgical History:  Procedure Laterality Date   CHOLECYSTECTOMY     COLONOSCOPY     KNEE SURGERY     Bilateral    POLYPECTOMY     SHOULDER SURGERY Bilateral    Bilateral - rotator cuff repair    TOTAL KNEE ARTHROPLASTY Bilateral    Social History   Tobacco Use   Smoking status: Former    Current packs/day: 0.00    Types: Cigarettes    Quit date: 07/15/1990    Years since quitting: 33.7    Passive exposure: Never   Smokeless tobacco: Former    Types: Engineer, drilling   Vaping status: Never Used  Substance Use Topics   Alcohol use: Yes    Comment: social   Drug use: No   Social History    Social History Narrative   1 caffeine drink daily       2 sons and a step daughter     Immunization History  Administered Date(s) Administered   INFLUENZA, HIGH DOSE SEASONAL PF 04/28/2019   Moderna Sars-Covid-2 Vaccination 09/08/2019, 10/06/2019   Tdap 11/19/2017     Objective: Vital Signs: BP 131/74   Pulse (!) 59   Temp 97.7 F (36.5 C)   Resp 14   Ht 5' 11 (1.803 m)   Wt 220 lb 9.6 oz (100.1 kg)   BMI 30.77 kg/m    Physical Exam Vitals and nursing note reviewed.  Constitutional:      Appearance: He  is well-developed.  HENT:     Head: Normocephalic and atraumatic.  Eyes:     Conjunctiva/sclera: Conjunctivae normal.     Pupils: Pupils are equal, round, and reactive to light.  Cardiovascular:     Rate and Rhythm: Normal rate and regular rhythm.     Heart sounds: Normal heart sounds.  Pulmonary:     Effort: Pulmonary effort is normal.     Breath sounds: Normal breath sounds.  Abdominal:     General: Bowel sounds are normal.     Palpations: Abdomen is soft.  Musculoskeletal:     Cervical back: Normal range of motion and neck supple.  Skin:    General: Skin is warm and dry.     Capillary Refill: Capillary refill takes less than 2 seconds.  Neurological:     Mental Status: He is alert and oriented to person, place, and time.  Psychiatric:        Behavior: Behavior normal.      Musculoskeletal Exam: C-spine has limited range of motion with lateral rotation.  Slightly limited mobility of the lumbar spine.  Tenderness of the right paraspinal muscles in the lower lumbar region.  No midline spinal tenderness.  No SI joint tenderness.  Some stiffness with range of motion of both shoulders especially with internal rotation.  Right elbow flexion contracture noted.  CMC, PIP, DIP thickening consistent with osteoarthritis of both hands.  No synovitis noted.  Both knee replacements have good range of motion.  Thickening of the left knee replacement.  Ankle joints have  good range of motion no tenderness or joint swelling.  No evidence of Achilles tendinitis or plantar fasciitis.   CDAI Exam: CDAI Score: -- Patient Global: --; Provider Global: -- Swollen: --; Tender: -- Joint Exam 04/15/2024   No joint exam has been documented for this visit   There is currently no information documented on the homunculus. Go to the Rheumatology activity and complete the homunculus joint exam.  Investigation: No additional findings.  Imaging: No results found.  Recent Labs: Lab Results  Component Value Date   WBC 8.2 06/16/2023   HGB 14.6 06/16/2023   PLT 265 06/16/2023   NA 139 06/16/2023   K 4.8 06/16/2023   CL 102 06/16/2023   CO2 30 06/16/2023   GLUCOSE 105 (H) 06/16/2023   BUN 16 06/16/2023   CREATININE 0.86 06/16/2023   BILITOT 0.8 06/16/2023   ALKPHOS 65 11/19/2016   AST 26 06/16/2023   ALT 31 06/16/2023   PROT 7.5 06/16/2023   ALBUMIN 4.4 11/19/2016   CALCIUM 9.4 06/16/2023   GFRAA 103 01/16/2021   QFTBGOLDPLUS CANCELED 10/10/2021    Speciality Comments: Last Dexa Scan 03/21/2020  Prior therapy: Humira , Remicaide, Cimzia , Stelara  (inadequate response)  Procedures:  No procedures performed Allergies: Patient has no known allergies.   Assessment / Plan:     Visit Diagnoses: Chronic inflammatory arthritis - Inflammatory, erosive: He has no synovitis or dactylitis on examination today.  He experiences intermittent stiffness in both hands which he attributes to repetitive overuse activities.  He has been doing some tile work this week which has exacerbated his stiffness in his hands.  No evidence of Achilles tendinitis or plantar fasciitis.  No SI joint tenderness upon palpation.  He remains under the care of pain management and has been taking Cymbalta  30 mg 1 capsule daily.  He has noticed a significant improvement in his pain levels while taking Cymbalta . He is no longer taking methotrexate  and has not  noticed any new or worsening symptoms  off of methotrexate .  He was advised to notify us  if he develops any signs or symptoms of a flare.  He will continue on Entyvio  IV infusions every 8 weeks under the care of GI for management of ulcerative colitis.  High risk medication use: Entyvio  IV infusions every 8 weeks by GI-resumed 10/09/22.  CBC and CMP updated on 03/10/24--reviewed results today. Previously held MTX August 2023 until March 2024.  Discontinued injectable methotrexate  due to developing an aversion with the weekly injections. Declined oral methotrexate .   Chronic right SI joint pain - Right SI joint cortisone injection performed on 10/30/2022.  No SI joint tenderness upon palpation today.  Positive TB test - Chest x-ray in 06/03/2022 revealed no active cardiopulmonary disease.  Other ulcerative colitis with other complication (HCC) - Under the care of Dr. Albertus. Diagnosis 2011, prior treatment with infliximab , adalimumab , Cimzia  and Stelara : Currently under the care of of GI and is receiving Entyvio  IV infusions every 8 weeks.  No recent gaps in therapy.  No recent or recurrent infections.  Overall her symptoms have been well-controlled.  Idiopathic chronic gout of multiple sites without tophus -He has not had any signs or symptoms of a gout flare.  He has clinically been doing well taking allopurinol  300 mg 1 tablet by mouth daily.  His uric acid levels within the desirable range: 5.4 on 03/10/2024.  History of bilateral rotator cuff tear repair: He experiences some stiffness and discomfort range of motion of both shoulders.  Limitation with internal rotation noted.  Contracture of right elbow: Unchanged.  No tenderness or inflammation along the joint line.  Primary osteoarthritis of both hands -He has CMC, PIP, DIP thickening.  He experiences intermittent bouts of stiffness involving both hands typically exacerbated by repetitive or overuse activities.  On examination he has no synovitis or dactylitis on exam.  Overall the  pain level in his hands has been better controlled since initiating Cymbalta  30 mg 1 capsule daily by pain management.  History of total bilateral knee replacement: Doing well.  Synovial thickening of the left knee replacement.  Other medical conditions are listed as follows  History of gastroesophageal reflux (GERD)  Orders: No orders of the defined types were placed in this encounter.  No orders of the defined types were placed in this encounter.    Follow-Up Instructions: Return in about 6 months (around 10/14/2024) for Chronic inflammatory arthritis, UC.   Waddell CHRISTELLA Craze, PA-C  Note - This record has been created using Dragon software.  Chart creation errors have been sought, but may not always  have been located. Such creation errors do not reflect on  the standard of medical care.

## 2024-04-15 ENCOUNTER — Ambulatory Visit: Attending: Physician Assistant | Admitting: Physician Assistant

## 2024-04-15 ENCOUNTER — Encounter: Payer: Self-pay | Admitting: Physician Assistant

## 2024-04-15 ENCOUNTER — Telehealth: Payer: Self-pay

## 2024-04-15 VITALS — BP 131/74 | HR 59 | Temp 97.7°F | Resp 14 | Ht 71.0 in | Wt 220.6 lb

## 2024-04-15 DIAGNOSIS — Z79899 Other long term (current) drug therapy: Secondary | ICD-10-CM

## 2024-04-15 DIAGNOSIS — M12819 Other specific arthropathies, not elsewhere classified, unspecified shoulder: Secondary | ICD-10-CM | POA: Diagnosis not present

## 2024-04-15 DIAGNOSIS — Z96653 Presence of artificial knee joint, bilateral: Secondary | ICD-10-CM

## 2024-04-15 DIAGNOSIS — M24521 Contracture, right elbow: Secondary | ICD-10-CM | POA: Diagnosis not present

## 2024-04-15 DIAGNOSIS — R7611 Nonspecific reaction to tuberculin skin test without active tuberculosis: Secondary | ICD-10-CM | POA: Diagnosis not present

## 2024-04-15 DIAGNOSIS — M533 Sacrococcygeal disorders, not elsewhere classified: Secondary | ICD-10-CM | POA: Diagnosis not present

## 2024-04-15 DIAGNOSIS — M1A09X0 Idiopathic chronic gout, multiple sites, without tophus (tophi): Secondary | ICD-10-CM | POA: Diagnosis not present

## 2024-04-15 DIAGNOSIS — M1380 Other specified arthritis, unspecified site: Secondary | ICD-10-CM

## 2024-04-15 DIAGNOSIS — K51818 Other ulcerative colitis with other complication: Secondary | ICD-10-CM

## 2024-04-15 DIAGNOSIS — M1990 Unspecified osteoarthritis, unspecified site: Secondary | ICD-10-CM

## 2024-04-15 DIAGNOSIS — G8929 Other chronic pain: Secondary | ICD-10-CM

## 2024-04-15 DIAGNOSIS — M19041 Primary osteoarthritis, right hand: Secondary | ICD-10-CM

## 2024-04-15 DIAGNOSIS — Z8719 Personal history of other diseases of the digestive system: Secondary | ICD-10-CM

## 2024-04-15 NOTE — Telephone Encounter (Signed)
 Labs received from:PCP  Drawn on:03/10/2024  Reviewed by:Waddell Craze, PA-C  Labs drawn:Uric Acid, UA w/Reflex, TSH, PSA, Lipid Panel, Hemoglobin A1c, CMP, and CBC  Results:Cholesterol 99 Hemoglobin A1c 6.4 Glucose 120

## 2024-04-16 ENCOUNTER — Encounter: Payer: Self-pay | Admitting: Family Medicine

## 2024-04-29 ENCOUNTER — Ambulatory Visit (INDEPENDENT_AMBULATORY_CARE_PROVIDER_SITE_OTHER)

## 2024-04-29 VITALS — BP 133/78 | HR 58 | Temp 97.8°F | Resp 16 | Ht 71.0 in | Wt 220.4 lb

## 2024-04-29 DIAGNOSIS — M1380 Other specified arthritis, unspecified site: Secondary | ICD-10-CM

## 2024-04-29 DIAGNOSIS — K5100 Ulcerative (chronic) pancolitis without complications: Secondary | ICD-10-CM

## 2024-04-29 DIAGNOSIS — K51011 Ulcerative (chronic) pancolitis with rectal bleeding: Secondary | ICD-10-CM

## 2024-04-29 MED ORDER — VEDOLIZUMAB 300 MG IV SOLR
300.0000 mg | Freq: Once | INTRAVENOUS | Status: AC
  Administered 2024-04-29: 300 mg via INTRAVENOUS
  Filled 2024-04-29: qty 5

## 2024-04-29 NOTE — Progress Notes (Signed)
 Diagnosis: Chronic inflammatory arthritis   Provider:  Lonna Coder MD  Procedure: IV Infusion  IV Type: Peripheral, IV Location: L Antecubital  Entyvio  (Vedolizumab ), Dose: 300 mg  Infusion Start Time: 0840  Infusion Stop Time: 0918  Post Infusion IV Care: Peripheral IV Discontinued  Discharge: Condition: Good, Destination: Home . AVS Declined  Performed by:  Maximiano JONELLE Pouch, LPN

## 2024-06-14 ENCOUNTER — Ambulatory Visit (INDEPENDENT_AMBULATORY_CARE_PROVIDER_SITE_OTHER)

## 2024-06-14 VITALS — BP 128/71 | HR 66 | Temp 97.8°F | Resp 18 | Ht 71.0 in | Wt 223.2 lb

## 2024-06-14 DIAGNOSIS — K5100 Ulcerative (chronic) pancolitis without complications: Secondary | ICD-10-CM | POA: Diagnosis not present

## 2024-06-14 DIAGNOSIS — M1380 Other specified arthritis, unspecified site: Secondary | ICD-10-CM | POA: Diagnosis not present

## 2024-06-14 DIAGNOSIS — K51011 Ulcerative (chronic) pancolitis with rectal bleeding: Secondary | ICD-10-CM | POA: Diagnosis not present

## 2024-06-14 MED ORDER — VEDOLIZUMAB 300 MG IV SOLR
300.0000 mg | Freq: Once | INTRAVENOUS | Status: AC
  Administered 2024-06-14: 300 mg via INTRAVENOUS
  Filled 2024-06-14: qty 5

## 2024-06-14 NOTE — Progress Notes (Signed)
 Diagnosis: Chronic inflammatory arthritis   Provider:  Mannam, Praveen MD  Procedure: IV Infusion  IV Type: Peripheral, IV Location: R Antecubital  , Entyvio  (Vedolizumab ), Dose: 300 mg  Infusion Start Time: 0824  Infusion Stop Time: 0858  Post Infusion IV Care: Peripheral IV Discontinued  Discharge: Condition: Good, Destination: Home . AVS Declined  Performed by:  Liza Czerwinski, RN

## 2024-06-21 ENCOUNTER — Other Ambulatory Visit: Payer: Self-pay | Admitting: Physician Assistant

## 2024-06-21 NOTE — Telephone Encounter (Signed)
 Last Fill: 02/26/2024  Labs: 03/10/2024  Glucose 120  Uric Acid 5.4  Next Visit: 10/14/2024  Last Visit: 04/15/2024  DX: Idiopathic chronic gout of multiple sites without tophus   Current Dose per office note 04/15/2024: allopurinol  300 mg 1 tablet by mouth daily   Okay to refill Allopurinol ?

## 2024-07-02 ENCOUNTER — Telehealth: Payer: Self-pay | Admitting: Pharmacy Technician

## 2024-07-02 NOTE — Telephone Encounter (Signed)
 Auth Submission: PENDING - LATENT Site of care: Site of care: [facility] Payer: [employer] MEDICARE Medication & CPT/J Code(s) submitted: Entyvio  (Vedolizumab ) J3380 Diagnosis Code: K51.00 Route of submission (phone, fax, portal): LATENT Phone # Fax # Auth type: Buy/Bill PB Units/visits requested: 300MG  [id number] Reference number:  Approval from:  to

## 2024-07-05 ENCOUNTER — Encounter: Payer: Self-pay | Admitting: Internal Medicine

## 2024-07-13 ENCOUNTER — Other Ambulatory Visit: Payer: Self-pay | Admitting: Internal Medicine

## 2024-07-13 DIAGNOSIS — K219 Gastro-esophageal reflux disease without esophagitis: Secondary | ICD-10-CM

## 2024-07-20 ENCOUNTER — Encounter: Attending: Physical Medicine & Rehabilitation | Admitting: Physical Medicine & Rehabilitation

## 2024-07-20 ENCOUNTER — Encounter: Payer: Self-pay | Admitting: Physical Medicine & Rehabilitation

## 2024-07-20 VITALS — Ht 71.0 in | Wt 222.0 lb

## 2024-07-20 DIAGNOSIS — M25511 Pain in right shoulder: Secondary | ICD-10-CM | POA: Diagnosis not present

## 2024-07-20 DIAGNOSIS — M47817 Spondylosis without myelopathy or radiculopathy, lumbosacral region: Secondary | ICD-10-CM | POA: Diagnosis not present

## 2024-07-20 DIAGNOSIS — M1380 Other specified arthritis, unspecified site: Secondary | ICD-10-CM | POA: Diagnosis not present

## 2024-07-20 DIAGNOSIS — M25512 Pain in left shoulder: Secondary | ICD-10-CM | POA: Insufficient documentation

## 2024-07-20 DIAGNOSIS — G8929 Other chronic pain: Secondary | ICD-10-CM | POA: Diagnosis not present

## 2024-07-20 MED ORDER — DULOXETINE HCL 40 MG PO CPEP: 40.0000 mg | ORAL_CAPSULE | Freq: Every day | ORAL | 1 refills | Status: AC

## 2024-07-20 NOTE — Progress Notes (Signed)
 "  Subjective:    Patient ID: Christopher Smith, male    DOB: [birth year], 74 y.o.   MRN: [id number]  HPI Discussed the use of AI scribe software for clinical note transcription with the patient, who gave verbal consent to proceed.  History of Present Illness Christopher Smith is a 74 year old male with osteoarthritis and inflammatory arthritis who presents for follow-up of chronic low back and bilateral shoulder pain.  He reports worsening musculoskeletal pain over the past 1-2 weeks, with the most severe symptoms localized to the low back, particularly at the L4-L5 level. The pain is described as a pulling sensation, rated 7/10 in intensity, exacerbated by forward flexion, and most pronounced at night, frequently disrupting sleep. Cold weather aggravates symptoms, while extension does not provoke pain. Previous lumbar injections provided significant relief initially, but the effect has diminished. Oral pain medication was previously increased due to reduced efficacy, and he expresses interest in further dose adjustment, preferring to avoid narcotics if possible.  Bilateral shoulder pain persists. The right shoulder sustained a traumatic rotator cuff tear requiring surgical repair with hardware following a lawn mower accident. The left shoulder was injured at work and underwent arthroscopic debridement. Both shoulders ache at night and contribute to sleep disturbance. He remains active and is able to perform most movements, with some actions pulling on the upper back.  He discontinued methotrexate  2-3 months prior to his initial visit with this provider due to gastrointestinal intolerance after 10 years of use, and was uncertain of its benefit for pain. He is scheduled to follow up with rheumatology next month. He maintains an active lifestyle and expresses a strong desire to remain functional despite chronic pain. He denies symptoms of depression.    Pain Inventory Average Pain 7 Pain Right  Now 4 My pain is constant, sharp, stabbing, and aching, numbness in feet at night.  In the last 24 hours, has pain interfered with the following? General activity 7 Relation with others 0 Enjoyment of life 7 What TIME of day is your pain at its worst? morning  and night Sleep (in general) Poor  Pain is worse with: bending and some activites Pain improves with: medication Relief from Meds: 3  Family History  Problem Relation Age of Onset   Alzheimer's disease Father    Stroke Mother    Cancer Mother        liver & breast    Breast cancer Mother    Liver cancer Mother    Rheum arthritis Brother    Rheum arthritis Brother    Rheum arthritis Sister    Rheum arthritis Sister    Breast cancer Sister    Rheum arthritis Sister    Breast cancer Sister    Colon cancer Neg Hx    Esophageal cancer Neg Hx    Stomach cancer Neg Hx    Rectal cancer Neg Hx    Colon polyps Neg Hx    Social History   Socioeconomic History   Marital status: Married    Spouse name: Not on file   Number of children: 3   Years of education: Not on file   Highest education level: Not on file  Occupational History   Occupation: Press Photographer: [employer]    Comment: retired  Tobacco Use   Smoking status: Former    Current packs/day: 0.00    Average packs/day: 1.0 packs/day    Types: Cigarettes    Quit date:  07/15/1990    Years since quitting: 34.0    Passive exposure: Never   Smokeless tobacco: Former    Types: Engineer, Drilling   Vaping status: Never Used  Substance and Sexual Activity   Alcohol use: Yes    Comment: social   Drug use: No   Sexual activity: Not on file  Other Topics Concern   Not on file  Social History Narrative   1 caffeine drink daily       2 sons and a step daughter   Social Drivers of Health   Tobacco Use: Medium Risk (07/20/2024)   Patient History    Smoking Tobacco Use: Former    Smokeless Tobacco Use: Former    Passive Exposure: Never   Programmer, Applications: Not on [employer] Insecurity: Not on file  Transportation Needs: Not on file  Physical Activity: Not on file  Stress: Not on file  Social Connections: Not on file  Depression (PHQ2-9): Low Risk (07/20/2024)   Depression (PHQ2-9)    PHQ-2 Score: 0  Alcohol Screen: Not on file  Housing: Not on file  Utilities: Not on file  Health Literacy: Not on file   Past Surgical History:  Procedure Laterality Date   CHOLECYSTECTOMY     COLONOSCOPY     KNEE SURGERY     Bilateral    POLYPECTOMY     SHOULDER SURGERY Bilateral    Bilateral - rotator cuff repair    TOTAL KNEE ARTHROPLASTY Bilateral    Past Surgical History:  Procedure Laterality Date   CHOLECYSTECTOMY     COLONOSCOPY     KNEE SURGERY     Bilateral    POLYPECTOMY     SHOULDER SURGERY Bilateral    Bilateral - rotator cuff repair    TOTAL KNEE ARTHROPLASTY Bilateral    Past Medical History:  Diagnosis Date   Allergy     Anal fissure    Arthritis    RA   Blood transfusion without reported diagnosis    COVID-19 06/2020   Diabetes mellitus    no meds taken 05-01-16- elevated glucose was from predisone for UC   Diarrhea    Fatty liver    GERD (gastroesophageal reflux disease)    Gout    Hiatal hernia    Hypertension    Internal hemorrhoids    Polyarthritis    PONV (postoperative nausea and vomiting)    Sessile rectal polyp 03/27/2011   Sinusitis    Tuberculosis    positive PPD- had an allergic reaction to the PPD, gets yearly chest xray   Tubular adenoma of colon    Ulcerative colitis 01/24/2011   Ht 5' 11 (1.803 m)   Wt 222 lb (100.7 kg)   BMI 30.96 kg/m   Opioid Risk Score:   Fall Risk Score:  `1  Depression screen [facility] 2/9     07/20/2024    9:38 AM 01/22/2024    8:47 AM 08/12/2023   12:48 PM 07/17/2023    9:18 AM 07/03/2023   11:04 AM 05/22/2023    9:01 AM  Depression screen PHQ 2/9  Decreased Interest 0 0 0 3 3 0  Down, Depressed, Hopeless 0 0 0 0 0 0  PHQ - 2 Score 0 0 0  3 3 0  Altered sleeping    3 3   Tired, decreased energy    3 3   Change in appetite    0 0   Feeling bad or failure about yourself  0 0   Trouble concentrating    0 0   Moving slowly or fidgety/restless    1 1   Suicidal thoughts    0 0   PHQ-9 Score    10  10    Difficult doing work/chores    Not difficult at all Not difficult at all      Data saved with a previous flowsheet row definition    Review of Systems  Musculoskeletal:  Positive for back pain.       Pain both shoulders, hands, feet  Neurological:  Positive for numbness.  All other systems reviewed and are negative.      Objective:   Physical Exam        Assessment & Plan:   Assessment and Plan Assessment & Plan Low back osteoarthritis Chronic low back osteoarthritis with persistent pain at 7/10, worsened by cold weather and forward flexion. Pain localized to L4-L5, affecting sleep and activity. Muscular or discogenic involvement possible. Increased medication dose cautiously to avoid narcotics. - Increased pain medication dose from 30 mg to 40 mg daily and sent prescription to pharmacy. - Provided one refill for pain medication with plan to reassess efficacy at next visit. - Discussed conditional plan to further increase medication dose if current regimen is insufficient, with a maximum dose up to 60 mg for pain management.  Bilateral rotator cuff tear with shoulder pain Bilateral rotator cuff tears with prior surgeries. Intermittent nocturnal shoulder pain without significant impingement or range of motion loss. - Encouraged continuation of home exercise routine for shoulder mobility and strength.  Inflammatory arthritis Inflammatory arthritis previously managed with methotrexate , discontinued due to gastrointestinal intolerance and questionable efficacy. - Advised follow-up with rheumatology as scheduled. Patient's back pain has been gradually increasing.  Is lumbar spondylosis with previously relieved  with bilateral L3-4 medial branch and L5 dorsal ramus injections under fluoroscopic guidance.  Will repeat next month.  If he has greater than 80% relief once again consider RFA at the same nerves in the future "

## 2024-07-20 NOTE — Patient Instructions (Signed)
" °  VISIT SUMMARY: During your visit, we discussed your chronic low back and bilateral shoulder pain, which has worsened recently. We adjusted your pain medication and reviewed your current treatment plan for your inflammatory arthritis.  YOUR PLAN: LOW BACK OSTEOARTHRITIS: You have chronic low back pain, particularly at the L4-L5 level, which is affecting your sleep and daily activities. -We increased your pain medication dose from 30 mg to 40 mg daily and sent the prescription to your pharmacy. -You have one refill for your pain medication, and we will reassess its effectiveness at your next visit. -If the current medication dose is not sufficient, we may consider increasing it up to a maximum of 60 mg.  BILATERAL ROTATOR CUFF TEAR WITH SHOULDER PAIN: You have ongoing shoulder pain from previous rotator cuff tears and surgeries, which is affecting your sleep. -Continue your home exercise routine to maintain shoulder mobility and strength.  INFLAMMATORY ARTHRITIS: You have inflammatory arthritis that was previously managed with methotrexate , which you discontinued due to side effects. -Follow up with rheumatology as scheduled next month.                      Contains text generated by Abridge.                                 Contains text generated by Abridge.   "

## 2024-07-26 ENCOUNTER — Ambulatory Visit: Admitting: *Deleted

## 2024-07-26 VITALS — BP 156/84 | HR 63 | Temp 97.5°F | Resp 16 | Ht 71.0 in | Wt 225.8 lb

## 2024-07-26 DIAGNOSIS — K5100 Ulcerative (chronic) pancolitis without complications: Secondary | ICD-10-CM

## 2024-07-26 DIAGNOSIS — M1380 Other specified arthritis, unspecified site: Secondary | ICD-10-CM

## 2024-07-26 DIAGNOSIS — K51011 Ulcerative (chronic) pancolitis with rectal bleeding: Secondary | ICD-10-CM

## 2024-07-26 MED ORDER — VEDOLIZUMAB 300 MG IV SOLR
300.0000 mg | Freq: Once | INTRAVENOUS | Status: AC
  Administered 2024-07-26: 300 mg via INTRAVENOUS
  Filled 2024-07-26: qty 5

## 2024-07-26 NOTE — Progress Notes (Signed)
 Diagnosis: Chronic Inflammatory Arthiritis  Provider:  Mannam, Praveen MD  Procedure: IV Infusion  IV Type: Peripheral, IV Location: L Antecubital  Entyvio  (Vedolizumab ), Dose: 300 mg  Infusion Start Time: 0827  Infusion Stop Time: 0903  Post Infusion IV Care: Patient declined observation and Peripheral IV Discontinued  Discharge: Condition: Good, Destination: Home . AVS Declined  Performed by:  Eward Rutigliano E, RN

## 2024-08-19 ENCOUNTER — Encounter: Admitting: Physical Medicine & Rehabilitation

## 2024-08-19 ENCOUNTER — Encounter: Payer: Self-pay | Admitting: Physical Medicine & Rehabilitation

## 2024-08-19 VITALS — BP 128/72 | HR 73 | Temp 98.1°F | Ht 71.0 in | Wt 223.4 lb

## 2024-08-19 DIAGNOSIS — M47817 Spondylosis without myelopathy or radiculopathy, lumbosacral region: Secondary | ICD-10-CM

## 2024-08-19 NOTE — Progress Notes (Signed)
" °  PROCEDURE RECORD [facility]   Name: Christopher Smith DOB:[birth year] MRN: [id number]  Date:08/19/2024  Physician: Prentice Compton, MD    Nurse/CMA: cm  Allergies: Allergies[1]NKDA  Consent Signed: Yes.    Is patient diabetic? No.  CBG today? N/A  Pregnant: No. LMP: No LMP for male patient. (age 75-55)  Anticoagulants: no Anti-inflammatory: no Antibiotics: no  Procedure: Bilateral L3-L4-L5 Medial branch Block Position:  Prone Start Time: 12:52 pm  End Time: 1:04 pm  Fluoro Time: 56  RN/CMA cm Jama, CMA    Time 12:29 1:10 pm    BP 128/72 131/67    Pulse 73 77    Respirations 12 14    O2 Sat 96 94    S/S 6 6    Pain Level 2 0/10     D/C home with no one, patient A & O X 3, D/C instructions reviewed, and sits independently.           [1] No Known Allergies  "

## 2024-08-19 NOTE — Progress Notes (Signed)
Bilateral Lumbar L3, L4  medial branch blocks and L 5 dorsal ramus injection under fluoroscopic guidance  Indication: Lumbar pain which is not relieved by medication management or other conservative care and interfering with self-care and mobility.  Informed consent was obtained after describing risks and benefits of the procedure with the patient, this includes bleeding, infection, paralysis and medication side effects.  The patient wishes to proceed and has given written consent.  The patient was placed in prone position.  The lumbar area was marked and prepped with Betadine.  One mL of 1% lidocaine was injected into each of 6 areas into the skin and subcutaneous tissue.  Then a 22-gauge 5" spinal needle was inserted targeting the junction of the left S1 superior articular process and sacral ala junction. Needle was advanced under fluoroscopic guidance.  Bone contact was made.Omnipaque 180 was injected x 0.5 mL demonstrating no intravascular uptake.  Then a solution  of 2% MPF lidocaine was injected x 0.5 mL.  Then the left L5 superior articular process in transverse process junction was targeted.  Bone contact was made. Omnipaque 180 was injected x 0.5 mL demonstrating no intravascular uptake. Then a solution containing  2% MPF lidocaine was injected x 0.5 mL.  Then the left L4 superior articular process in transverse process junction was targeted.  Bone contact was made. Omnipaque 180 was injected x 0.5 mL demonstrating no intravascular uptake.  Then a solution containing2% MPF lidocaine was injected x 0.5 mL.  This same procedure was performed on the right side using the same needle, technique and injectate.  Patient tolerated procedure well.  Post procedure instructions were given.   Lidocaine 1% with preservative multidose, 10ml no waste Lidocaine 2% MPF, 5ml bottle, 3ml used 2ml waste Omnipaque 180 1.5 ml used, 8.5 ml waste  

## 2024-08-19 NOTE — Patient Instructions (Signed)

## 2024-09-06 ENCOUNTER — Ambulatory Visit

## 2024-10-14 ENCOUNTER — Ambulatory Visit: Admitting: Rheumatology

## 2024-11-16 ENCOUNTER — Encounter: Admitting: Physical Medicine & Rehabilitation
